# Patient Record
Sex: Female | Born: 1940 | Race: White | Hispanic: No | Marital: Married | Smoking: Never smoker
Health system: Southern US, Community
[De-identification: ages and names within clinical notes are randomized; demographics above are authoritative.]

## PROBLEM LIST (undated history)

## (undated) DIAGNOSIS — M1990 Unspecified osteoarthritis, unspecified site: Secondary | ICD-10-CM

## (undated) DIAGNOSIS — I493 Ventricular premature depolarization: Secondary | ICD-10-CM

## (undated) DIAGNOSIS — I2510 Atherosclerotic heart disease of native coronary artery without angina pectoris: Secondary | ICD-10-CM

## (undated) DIAGNOSIS — I255 Ischemic cardiomyopathy: Secondary | ICD-10-CM

## (undated) HISTORY — DX: Ventricular premature depolarization: I49.3

---

## 1999-05-17 ENCOUNTER — Encounter: Admission: RE | Admit: 1999-05-17 | Discharge: 1999-05-17 | Payer: Self-pay | Admitting: Family Medicine

## 1999-05-17 ENCOUNTER — Encounter: Payer: Self-pay | Admitting: Family Medicine

## 2000-06-14 ENCOUNTER — Encounter: Admission: RE | Admit: 2000-06-14 | Discharge: 2000-06-14 | Payer: Self-pay | Admitting: Family Medicine

## 2000-06-14 ENCOUNTER — Encounter: Payer: Self-pay | Admitting: Family Medicine

## 2001-05-19 ENCOUNTER — Ambulatory Visit (HOSPITAL_COMMUNITY): Admission: RE | Admit: 2001-05-19 | Discharge: 2001-05-19 | Payer: Self-pay | Admitting: *Deleted

## 2001-05-19 ENCOUNTER — Encounter: Payer: Self-pay | Admitting: *Deleted

## 2001-05-20 ENCOUNTER — Ambulatory Visit (HOSPITAL_COMMUNITY): Admission: RE | Admit: 2001-05-20 | Discharge: 2001-05-20 | Payer: Self-pay | Admitting: *Deleted

## 2004-08-04 ENCOUNTER — Other Ambulatory Visit: Admission: RE | Admit: 2004-08-04 | Discharge: 2004-08-04 | Payer: Self-pay | Admitting: *Deleted

## 2004-09-07 ENCOUNTER — Encounter: Admission: RE | Admit: 2004-09-07 | Discharge: 2004-09-07 | Payer: Self-pay | Admitting: Family Medicine

## 2004-09-09 ENCOUNTER — Encounter: Admission: RE | Admit: 2004-09-09 | Discharge: 2004-09-09 | Payer: Self-pay | Admitting: Family Medicine

## 2006-01-31 ENCOUNTER — Encounter: Admission: RE | Admit: 2006-01-31 | Discharge: 2006-01-31 | Payer: Self-pay | Admitting: Neurosurgery

## 2007-10-08 ENCOUNTER — Other Ambulatory Visit: Admission: RE | Admit: 2007-10-08 | Discharge: 2007-10-08 | Payer: Self-pay | Admitting: Family Medicine

## 2007-11-07 ENCOUNTER — Encounter: Admission: RE | Admit: 2007-11-07 | Discharge: 2007-11-07 | Payer: Self-pay | Admitting: Family Medicine

## 2009-06-23 ENCOUNTER — Encounter: Admission: RE | Admit: 2009-06-23 | Discharge: 2009-06-23 | Payer: Self-pay | Admitting: Family Medicine

## 2009-11-08 ENCOUNTER — Encounter: Admission: RE | Admit: 2009-11-08 | Discharge: 2009-11-08 | Payer: Self-pay | Admitting: Family Medicine

## 2010-06-25 ENCOUNTER — Encounter: Payer: Self-pay | Admitting: Family Medicine

## 2010-11-09 ENCOUNTER — Other Ambulatory Visit: Payer: Self-pay | Admitting: Family Medicine

## 2010-11-09 DIAGNOSIS — Z1231 Encounter for screening mammogram for malignant neoplasm of breast: Secondary | ICD-10-CM

## 2010-11-15 ENCOUNTER — Ambulatory Visit
Admission: RE | Admit: 2010-11-15 | Discharge: 2010-11-15 | Disposition: A | Discharge status: Home / Self Care | Payer: Medicare Other | Source: Ambulatory Visit | Attending: Family Medicine | Admitting: Family Medicine

## 2010-11-15 DIAGNOSIS — Z1231 Encounter for screening mammogram for malignant neoplasm of breast: Secondary | ICD-10-CM

## 2011-11-21 ENCOUNTER — Other Ambulatory Visit: Payer: Self-pay | Admitting: Family Medicine

## 2011-11-21 DIAGNOSIS — Z1231 Encounter for screening mammogram for malignant neoplasm of breast: Secondary | ICD-10-CM

## 2011-12-12 ENCOUNTER — Ambulatory Visit: Payer: Medicare Other

## 2012-01-08 ENCOUNTER — Ambulatory Visit
Admission: RE | Admit: 2012-01-08 | Discharge: 2012-01-08 | Disposition: A | Discharge status: Home / Self Care | Payer: Medicare Other | Source: Ambulatory Visit | Attending: Family Medicine | Admitting: Family Medicine

## 2012-01-08 DIAGNOSIS — Z1231 Encounter for screening mammogram for malignant neoplasm of breast: Secondary | ICD-10-CM

## 2013-10-22 ENCOUNTER — Other Ambulatory Visit: Payer: Self-pay

## 2013-10-22 DIAGNOSIS — Z1231 Encounter for screening mammogram for malignant neoplasm of breast: Secondary | ICD-10-CM

## 2013-11-06 ENCOUNTER — Ambulatory Visit: Payer: Medicare Other

## 2014-01-14 ENCOUNTER — Ambulatory Visit
Admission: RE | Admit: 2014-01-14 | Discharge: 2014-01-14 | Disposition: A | Discharge status: Home / Self Care | Payer: Medicare Other | Source: Ambulatory Visit

## 2014-01-14 DIAGNOSIS — Z1231 Encounter for screening mammogram for malignant neoplasm of breast: Secondary | ICD-10-CM

## 2015-02-09 ENCOUNTER — Other Ambulatory Visit: Payer: Self-pay

## 2015-02-09 DIAGNOSIS — Z1231 Encounter for screening mammogram for malignant neoplasm of breast: Secondary | ICD-10-CM

## 2015-03-17 ENCOUNTER — Ambulatory Visit: Payer: Self-pay

## 2015-04-21 ENCOUNTER — Ambulatory Visit: Payer: Self-pay

## 2015-06-03 ENCOUNTER — Ambulatory Visit: Payer: Self-pay

## 2015-08-11 ENCOUNTER — Ambulatory Visit
Admission: RE | Admit: 2015-08-11 | Discharge: 2015-08-11 | Disposition: A | Discharge status: Home / Self Care | Payer: PPO | Source: Ambulatory Visit

## 2015-08-11 DIAGNOSIS — Z1231 Encounter for screening mammogram for malignant neoplasm of breast: Secondary | ICD-10-CM | POA: Diagnosis not present

## 2015-11-09 DIAGNOSIS — H1089 Other conjunctivitis: Secondary | ICD-10-CM | POA: Diagnosis not present

## 2015-12-13 DIAGNOSIS — N951 Menopausal and female climacteric states: Secondary | ICD-10-CM | POA: Diagnosis not present

## 2015-12-13 DIAGNOSIS — M810 Age-related osteoporosis without current pathological fracture: Secondary | ICD-10-CM | POA: Diagnosis not present

## 2015-12-13 DIAGNOSIS — Z1211 Encounter for screening for malignant neoplasm of colon: Secondary | ICD-10-CM | POA: Diagnosis not present

## 2015-12-13 DIAGNOSIS — M150 Primary generalized (osteo)arthritis: Secondary | ICD-10-CM | POA: Diagnosis not present

## 2015-12-13 DIAGNOSIS — E782 Mixed hyperlipidemia: Secondary | ICD-10-CM | POA: Diagnosis not present

## 2015-12-13 DIAGNOSIS — Z1389 Encounter for screening for other disorder: Secondary | ICD-10-CM | POA: Diagnosis not present

## 2015-12-13 DIAGNOSIS — E559 Vitamin D deficiency, unspecified: Secondary | ICD-10-CM | POA: Diagnosis not present

## 2015-12-13 DIAGNOSIS — Z791 Long term (current) use of non-steroidal anti-inflammatories (NSAID): Secondary | ICD-10-CM | POA: Diagnosis not present

## 2015-12-13 DIAGNOSIS — Z0000 Encounter for general adult medical examination without abnormal findings: Secondary | ICD-10-CM | POA: Diagnosis not present

## 2015-12-15 DIAGNOSIS — M545 Low back pain: Secondary | ICD-10-CM | POA: Diagnosis not present

## 2015-12-15 DIAGNOSIS — Z0000 Encounter for general adult medical examination without abnormal findings: Secondary | ICD-10-CM | POA: Diagnosis not present

## 2015-12-15 DIAGNOSIS — N951 Menopausal and female climacteric states: Secondary | ICD-10-CM | POA: Diagnosis not present

## 2015-12-15 DIAGNOSIS — M85851 Other specified disorders of bone density and structure, right thigh: Secondary | ICD-10-CM | POA: Diagnosis not present

## 2015-12-15 DIAGNOSIS — E559 Vitamin D deficiency, unspecified: Secondary | ICD-10-CM | POA: Diagnosis not present

## 2015-12-15 DIAGNOSIS — R7301 Impaired fasting glucose: Secondary | ICD-10-CM | POA: Diagnosis not present

## 2015-12-15 DIAGNOSIS — Z1389 Encounter for screening for other disorder: Secondary | ICD-10-CM | POA: Diagnosis not present

## 2015-12-15 DIAGNOSIS — Z01419 Encounter for gynecological examination (general) (routine) without abnormal findings: Secondary | ICD-10-CM | POA: Diagnosis not present

## 2015-12-15 DIAGNOSIS — Z1211 Encounter for screening for malignant neoplasm of colon: Secondary | ICD-10-CM | POA: Diagnosis not present

## 2015-12-15 DIAGNOSIS — Z791 Long term (current) use of non-steroidal anti-inflammatories (NSAID): Secondary | ICD-10-CM | POA: Diagnosis not present

## 2016-01-31 ENCOUNTER — Encounter (HOSPITAL_COMMUNITY): Payer: Self-pay

## 2016-01-31 ENCOUNTER — Emergency Department (HOSPITAL_COMMUNITY): Payer: PPO

## 2016-01-31 ENCOUNTER — Inpatient Hospital Stay (HOSPITAL_COMMUNITY)
Admission: EM | Admit: 2016-01-31 | Discharge: 2016-02-16 | DRG: 234 | Disposition: A | Discharge status: Home Health | Payer: PPO | Attending: Cardiothoracic Surgery | Admitting: Cardiothoracic Surgery

## 2016-01-31 DIAGNOSIS — J9811 Atelectasis: Secondary | ICD-10-CM

## 2016-01-31 DIAGNOSIS — E8770 Fluid overload, unspecified: Secondary | ICD-10-CM | POA: Diagnosis not present

## 2016-01-31 DIAGNOSIS — I420 Dilated cardiomyopathy: Secondary | ICD-10-CM | POA: Diagnosis not present

## 2016-01-31 DIAGNOSIS — I2510 Atherosclerotic heart disease of native coronary artery without angina pectoris: Secondary | ICD-10-CM

## 2016-01-31 DIAGNOSIS — R5383 Other fatigue: Secondary | ICD-10-CM | POA: Diagnosis not present

## 2016-01-31 DIAGNOSIS — I25110 Atherosclerotic heart disease of native coronary artery with unstable angina pectoris: Secondary | ICD-10-CM | POA: Diagnosis not present

## 2016-01-31 DIAGNOSIS — I429 Cardiomyopathy, unspecified: Secondary | ICD-10-CM | POA: Diagnosis not present

## 2016-01-31 DIAGNOSIS — I5181 Takotsubo syndrome: Secondary | ICD-10-CM | POA: Diagnosis present

## 2016-01-31 DIAGNOSIS — R0602 Shortness of breath: Secondary | ICD-10-CM | POA: Diagnosis not present

## 2016-01-31 DIAGNOSIS — I493 Ventricular premature depolarization: Secondary | ICD-10-CM | POA: Diagnosis not present

## 2016-01-31 DIAGNOSIS — E440 Moderate protein-calorie malnutrition: Secondary | ICD-10-CM | POA: Insufficient documentation

## 2016-01-31 DIAGNOSIS — Z951 Presence of aortocoronary bypass graft: Secondary | ICD-10-CM

## 2016-01-31 DIAGNOSIS — D689 Coagulation defect, unspecified: Secondary | ICD-10-CM | POA: Diagnosis not present

## 2016-01-31 DIAGNOSIS — E785 Hyperlipidemia, unspecified: Secondary | ICD-10-CM | POA: Diagnosis not present

## 2016-01-31 DIAGNOSIS — Z23 Encounter for immunization: Secondary | ICD-10-CM | POA: Diagnosis not present

## 2016-01-31 DIAGNOSIS — D6959 Other secondary thrombocytopenia: Secondary | ICD-10-CM | POA: Diagnosis not present

## 2016-01-31 DIAGNOSIS — Z79890 Hormone replacement therapy (postmenopausal): Secondary | ICD-10-CM

## 2016-01-31 DIAGNOSIS — D72829 Elevated white blood cell count, unspecified: Secondary | ICD-10-CM | POA: Diagnosis not present

## 2016-01-31 DIAGNOSIS — R079 Chest pain, unspecified: Secondary | ICD-10-CM | POA: Diagnosis not present

## 2016-01-31 DIAGNOSIS — D62 Acute posthemorrhagic anemia: Secondary | ICD-10-CM | POA: Diagnosis not present

## 2016-01-31 DIAGNOSIS — I255 Ischemic cardiomyopathy: Secondary | ICD-10-CM | POA: Diagnosis not present

## 2016-01-31 DIAGNOSIS — I214 Non-ST elevation (NSTEMI) myocardial infarction: Secondary | ICD-10-CM | POA: Diagnosis not present

## 2016-01-31 DIAGNOSIS — R000 Tachycardia, unspecified: Secondary | ICD-10-CM | POA: Diagnosis not present

## 2016-01-31 DIAGNOSIS — M1990 Unspecified osteoarthritis, unspecified site: Secondary | ICD-10-CM

## 2016-01-31 DIAGNOSIS — K219 Gastro-esophageal reflux disease without esophagitis: Secondary | ICD-10-CM | POA: Diagnosis not present

## 2016-01-31 DIAGNOSIS — Z791 Long term (current) use of non-steroidal anti-inflammatories (NSAID): Secondary | ICD-10-CM | POA: Diagnosis not present

## 2016-01-31 DIAGNOSIS — I10 Essential (primary) hypertension: Secondary | ICD-10-CM | POA: Diagnosis not present

## 2016-01-31 DIAGNOSIS — R1013 Epigastric pain: Secondary | ICD-10-CM | POA: Diagnosis not present

## 2016-01-31 DIAGNOSIS — I25700 Atherosclerosis of coronary artery bypass graft(s), unspecified, with unstable angina pectoris: Secondary | ICD-10-CM | POA: Diagnosis not present

## 2016-01-31 DIAGNOSIS — I472 Ventricular tachycardia: Secondary | ICD-10-CM | POA: Diagnosis not present

## 2016-01-31 DIAGNOSIS — Z01810 Encounter for preprocedural cardiovascular examination: Secondary | ICD-10-CM | POA: Diagnosis not present

## 2016-01-31 HISTORY — DX: Atherosclerotic heart disease of native coronary artery without angina pectoris: I25.10

## 2016-01-31 HISTORY — DX: Unspecified osteoarthritis, unspecified site: M19.90

## 2016-01-31 HISTORY — DX: Ischemic cardiomyopathy: I25.5

## 2016-01-31 LAB — CBC
HEMATOCRIT: 41.6 % (ref 36.0–46.0)
Hemoglobin: 14.2 g/dL (ref 12.0–15.0)
MCH: 30.2 pg (ref 26.0–34.0)
MCHC: 34.1 g/dL (ref 30.0–36.0)
MCV: 88.5 fL (ref 78.0–100.0)
Platelets: 182 10*3/uL (ref 150–400)
RBC: 4.7 MIL/uL (ref 3.87–5.11)
RDW: 12.5 % (ref 11.5–15.5)
WBC: 9.6 10*3/uL (ref 4.0–10.5)

## 2016-01-31 LAB — BASIC METABOLIC PANEL
ANION GAP: 9 (ref 5–15)
BUN: 13 mg/dL (ref 6–20)
CO2: 24 mmol/L (ref 22–32)
Calcium: 9.8 mg/dL (ref 8.9–10.3)
Chloride: 109 mmol/L (ref 101–111)
Creatinine, Ser: 0.9 mg/dL (ref 0.44–1.00)
GFR calc Af Amer: >60 mL/min (ref >60)
GFR calc non Af Amer: >60 mL/min (ref >60)
GLUCOSE: 119 mg/dL — AB (ref 65–99)
POTASSIUM: 3.6 mmol/L (ref 3.5–5.1)
Sodium: 142 mmol/L (ref 135–145)

## 2016-01-31 LAB — APTT: aPTT: 27 seconds (ref 24–36)

## 2016-01-31 LAB — PROTIME-INR
INR: 0.98
Prothrombin Time: 13 seconds (ref 11.4–15.2)

## 2016-01-31 LAB — I-STAT TROPONIN, ED: TROPONIN I, POC: 0.17 ng/mL — AB (ref 0.00–0.08)

## 2016-01-31 MED ORDER — ATORVASTATIN CALCIUM 40 MG PO TABS
40.0000 mg | ORAL_TABLET | Freq: Every day | ORAL | Status: DC
  Administered 2016-02-02 – 2016-02-09 (×8): 40 mg via ORAL
  Filled 2016-01-31 (×9): qty 1

## 2016-01-31 MED ORDER — PROGESTERONE MICRONIZED 100 MG PO CAPS
100.0000 mg | ORAL_CAPSULE | Freq: Every day | ORAL | Status: DC
  Administered 2016-02-01 – 2016-02-05 (×5): 100 mg via ORAL
  Filled 2016-01-31 (×6): qty 1

## 2016-01-31 MED ORDER — FAMOTIDINE 20 MG PO TABS
10.0000 mg | ORAL_TABLET | Freq: Every day | ORAL | Status: DC
  Administered 2016-02-01 – 2016-02-09 (×9): 10 mg via ORAL
  Filled 2016-01-31 (×9): qty 1

## 2016-01-31 MED ORDER — ONDANSETRON HCL 4 MG/2ML IJ SOLN: 4.0000 mg | Freq: Four times a day (QID) | INTRAMUSCULAR | Status: DC | PRN

## 2016-01-31 MED ORDER — ENOXAPARIN SODIUM 60 MG/0.6ML ~~LOC~~ SOLN
1.0000 mg/kg | Freq: Once | SUBCUTANEOUS | Status: AC
  Administered 2016-01-31: 55 mg via SUBCUTANEOUS
  Filled 2016-01-31: qty 0.6

## 2016-01-31 MED ORDER — METOPROLOL TARTRATE 12.5 MG HALF TABLET
12.5000 mg | ORAL_TABLET | Freq: Two times a day (BID) | ORAL | Status: DC
  Administered 2016-02-01 – 2016-02-09 (×19): 12.5 mg via ORAL
  Filled 2016-01-31 (×19): qty 1

## 2016-01-31 MED ORDER — ASPIRIN 81 MG PO CHEW
324.0000 mg | CHEWABLE_TABLET | Freq: Once | ORAL | Status: AC
  Administered 2016-01-31: 324 mg via ORAL
  Filled 2016-01-31: qty 4

## 2016-01-31 MED ORDER — ASPIRIN EC 81 MG PO TBEC
81.0000 mg | DELAYED_RELEASE_TABLET | Freq: Every day | ORAL | Status: DC
  Administered 2016-02-02 – 2016-02-09 (×8): 81 mg via ORAL
  Filled 2016-01-31 (×8): qty 1

## 2016-01-31 MED ORDER — VITAMIN D 1000 UNITS PO TABS
2000.0000 [IU] | ORAL_TABLET | Freq: Two times a day (BID) | ORAL | Status: DC
  Administered 2016-02-01 – 2016-02-09 (×18): 2000 [IU] via ORAL
  Filled 2016-01-31 (×18): qty 2

## 2016-01-31 MED ORDER — ESTRADIOL 1 MG PO TABS
0.5000 mg | ORAL_TABLET | Freq: Every day | ORAL | Status: DC
  Administered 2016-02-01 – 2016-02-05 (×5): 0.5 mg via ORAL
  Filled 2016-01-31 (×6): qty 0.5

## 2016-01-31 MED ORDER — NITROGLYCERIN 0.4 MG SL SUBL: 0.4000 mg | SUBLINGUAL_TABLET | SUBLINGUAL | Status: DC | PRN

## 2016-01-31 MED ORDER — ACETAMINOPHEN 325 MG PO TABS: 650.0000 mg | ORAL_TABLET | ORAL | Status: DC | PRN

## 2016-01-31 NOTE — ED Triage Notes (Signed)
Pt presents to ED after PCP blood work resulted Troponin 0.36 and minor EKG changes.   C/o generalized chest tightness and SOB w/ exertion 5 days.  Denies pain.  Pt reports "terrible acid reflux" and emesis x 1 episode x 6 days ago.

## 2016-01-31 NOTE — ED Notes (Signed)
Attempted to call report to [facility]. Nurse from floor will call back for report.

## 2016-01-31 NOTE — ED Provider Notes (Signed)
[facility]-EMERGENCY DEPT Provider Note   CSN: [id number] Arrival date & time: 01/31/16  1859     History   Chief Complaint Chief Complaint  Patient presents with  . Elevated Troponin    0.36  . Chest Pain    HPI Carolyn Burns is a 75 y.o. female.  HPI 75 year old female who presents with elevated troponin. History of OA. One week ago, in kitchen and develop severe cramping anterior chest pain with nausea and vomiting. Lasted 30 minutes, before resolving. Since then with exertional shortness of breath and chest tightness with walking, cleaning her home. No leg swelling or leg pain. No fever, cough, orthopnea, PND. Very fatigued since initial incident one week ago. Currently w/o chest pain. Seen by PCP with elevated troponin of 0.36 today. Sent to ED  Past Medical History:  Diagnosis Date  . Osteoarthritis     Patient Active Problem List   Diagnosis Date Noted  . NSTEMI (non-ST elevated myocardial infarction) (HCC) 01/31/2016    History reviewed. No pertinent surgical history.  OB History    No data available       Home Medications    Prior to Admission medications   Not on File    Family History History reviewed. No pertinent family history.  Social History Social History  Substance Use Topics  . Smoking status: Never Smoker  . Smokeless tobacco: Never Used  . Alcohol use No     Allergies   Aleve [naproxen sodium] and Codeine   Review of Systems Review of Systems 10/14 systems reviewed and are negative other than those stated in the HPI   Physical Exam Updated Vital Signs BP 158/85 (BP Location: Left Arm)   Pulse 89   Temp 98 F (36.7 C) (Oral)   Resp 17   Ht 5\' 1"  (1.549 m)   Wt 116 lb (52.6 kg)   SpO2 97%   BMI 21.92 kg/m   Physical Exam Physical Exam  Nursing note and vitals reviewed. Constitutional: Well developed, well nourished, non-toxic, and in no acute distress Head: Normocephalic and atraumatic.  Mouth/Throat: Oropharynx  is clear and moist.  Neck: Normal range of motion. Neck supple.  Cardiovascular: Normal rate and regular rhythm.   Pulmonary/Chest: Effort normal and breath sounds normal.  Abdominal: Soft. There is no tenderness. There is no rebound and no guarding.  Musculoskeletal: Normal range of motion.  Neurological: Alert, no facial droop, fluent speech, moves all extremities symmetrically Skin: Skin is warm and dry.  Psychiatric: Cooperative   ED Treatments / Results  Labs (all labs ordered are listed, but only abnormal results are displayed) Labs Reviewed  BASIC METABOLIC PANEL - Abnormal; Notable for the following:       Result Value   Glucose, Bld 119 (*)    All other components within normal limits  I-STAT TROPOININ, ED - Abnormal; Notable for the following:    Troponin i, poc 0.17 (*)    All other components within normal limits  CBC  APTT  PROTIME-INR    EKG  EKG Interpretation  Date/Time:  Monday January 31 2016 19:10:16 EDT Ventricular Rate:  90 PR Interval:    QRS Duration: 82 QT Interval:  367 QTC Calculation: 449 R Axis:   14 Text Interpretation:  Sinus rhythm Borderline T abnormalities, inferior leads  No prior EKG  Confirmed by Patterson Hollenbaugh MD, Trayshawn Durkin [phone]([id number]) on 01/31/2016 7:38:27 PM       Radiology Dg Chest 2 View  Result Date: 01/31/2016 CLINICAL DATA:  Chest tightness and shortness breath with exertion for the past 5 days. Ex-smoker. EXAM: CHEST  2 VIEW COMPARISON:  01/31/2016. FINDINGS: Normal sized heart. Clear lungs with normal vascularity. Minimal central peribronchial thickening. Mild thoracic spine degenerative changes. IMPRESSION: Interval minimal bronchitic changes. Electronically Signed   By: Beckie Salts M.D.   On: 01/31/2016 19:33    Procedures Procedures (including critical care time)  Medications Ordered in ED Medications  aspirin chewable tablet 324 mg (not administered)  enoxaparin (LOVENOX) injection 55 mg (not administered)     Initial Impression /  Assessment and Plan / ED Course  I have reviewed the triage vital signs and the nursing notes.  Pertinent labs & imaging results that were available during my care of the patient were reviewed by me and considered in my medical decision making (see chart for details).  Clinical Course    With NSTEMI. Discussed with cardiology to admit at [facility]. EKG with TWI in inferolateral leads. Troponin at 0.17. Given aspirin. Per cardiology, can give Lovenox instead of heparin drip  HD stable in ED. Chest pain free in ED.   Final Clinical Impressions(s) / ED Diagnoses   Final diagnoses:  NSTEMI (non-ST elevated myocardial infarction) [facility])    New Prescriptions New Prescriptions   No medications on file     Lavera Guise, MD 01/31/16 2047

## 2016-01-31 NOTE — Progress Notes (Signed)
Patient reports her pcp is Dr. Selena BattenWendy McNeil.  System updated.

## 2016-01-31 NOTE — ED Notes (Signed)
[facility] called for transport. 

## 2016-01-31 NOTE — Progress Notes (Addendum)
[facility] and [facility] to the room. Tele monitoring and skin assessment completed. Physician paged, family members at beside. Call light within reach patient informed of NPO status at midnight

## 2016-02-01 ENCOUNTER — Encounter (HOSPITAL_COMMUNITY)
Admission: EM | Disposition: A | Discharge status: Home Health | Payer: Self-pay | Source: Home / Self Care | Attending: Cardiovascular Disease

## 2016-02-01 DIAGNOSIS — I214 Non-ST elevation (NSTEMI) myocardial infarction: Principal | ICD-10-CM

## 2016-02-01 DIAGNOSIS — I2510 Atherosclerotic heart disease of native coronary artery without angina pectoris: Secondary | ICD-10-CM | POA: Diagnosis not present

## 2016-02-01 HISTORY — PX: CARDIAC CATHETERIZATION: SHX172

## 2016-02-01 LAB — CBC
HCT: 41.3 % (ref 36.0–46.0)
Hemoglobin: 14 g/dL (ref 12.0–15.0)
MCH: 30.2 pg (ref 26.0–34.0)
MCHC: 33.9 g/dL (ref 30.0–36.0)
MCV: 89 fL (ref 78.0–100.0)
PLATELETS: 174 10*3/uL (ref 150–400)
RBC: 4.64 MIL/uL (ref 3.87–5.11)
RDW: 12.6 % (ref 11.5–15.5)
WBC: 9 10*3/uL (ref 4.0–10.5)

## 2016-02-01 LAB — BASIC METABOLIC PANEL
Anion gap: 8 (ref 5–15)
BUN: 9 mg/dL (ref 6–20)
CALCIUM: 9.3 mg/dL (ref 8.9–10.3)
CO2: 25 mmol/L (ref 22–32)
CREATININE: 0.85 mg/dL (ref 0.44–1.00)
Chloride: 108 mmol/L (ref 101–111)
GFR calc Af Amer: >60 mL/min (ref >60)
Glucose, Bld: 114 mg/dL — ABNORMAL HIGH (ref 65–99)
Potassium: 3.8 mmol/L (ref 3.5–5.1)
SODIUM: 141 mmol/L (ref 135–145)

## 2016-02-01 LAB — TROPONIN I
TROPONIN I: 0.34 ng/mL — AB (ref <0.03)
TROPONIN I: 0.38 ng/mL — AB (ref <0.03)
TROPONIN I: 0.44 ng/mL — AB (ref <0.03)

## 2016-02-01 LAB — LIPID PANEL
CHOLESTEROL: 200 mg/dL (ref 0–200)
HDL: 34 mg/dL — ABNORMAL LOW (ref >40)
LDL Cholesterol: 140 mg/dL — ABNORMAL HIGH (ref 0–99)
Total CHOL/HDL Ratio: 5.9 RATIO
Triglycerides: 131 mg/dL (ref <150)
VLDL: 26 mg/dL (ref 0–40)

## 2016-02-01 LAB — PROTIME-INR
INR: 1.06
Prothrombin Time: 13.8 seconds (ref 11.4–15.2)

## 2016-02-01 LAB — MRSA PCR SCREENING: MRSA BY PCR: NEGATIVE

## 2016-02-01 SURGERY — LEFT HEART CATH AND CORONARY ANGIOGRAPHY

## 2016-02-01 MED ORDER — MIDAZOLAM HCL 2 MG/2ML IJ SOLN
INTRAMUSCULAR | Status: DC | PRN
Start: 2016-02-01 — End: 2016-02-01
  Administered 2016-02-01: 1 mg via INTRAVENOUS

## 2016-02-01 MED ORDER — ONDANSETRON HCL 4 MG/2ML IJ SOLN
4.0000 mg | Freq: Four times a day (QID) | INTRAMUSCULAR | Status: DC | PRN
  Administered 2016-02-06: 4 mg via INTRAVENOUS
  Filled 2016-02-01: qty 2

## 2016-02-01 MED ORDER — DIAZEPAM 5 MG PO TABS: 5.0000 mg | ORAL_TABLET | Freq: Four times a day (QID) | ORAL | Status: DC | PRN

## 2016-02-01 MED ORDER — SODIUM CHLORIDE 0.9% FLUSH
3.0000 mL | Freq: Two times a day (BID) | INTRAVENOUS | Status: DC
  Administered 2016-02-02 – 2016-02-05 (×6): 3 mL via INTRAVENOUS

## 2016-02-01 MED ORDER — SODIUM CHLORIDE 0.9 % WEIGHT BASED INFUSION: 1.0000 mL/kg/h | INTRAVENOUS | Status: DC

## 2016-02-01 MED ORDER — SODIUM CHLORIDE 0.9% FLUSH: 3.0000 mL | Freq: Two times a day (BID) | INTRAVENOUS | Status: DC

## 2016-02-01 MED ORDER — HEPARIN (PORCINE) IN NACL 100-0.45 UNIT/ML-% IJ SOLN
1000.0000 [IU]/h | INTRAMUSCULAR | Status: DC
  Administered 2016-02-02: 750 [IU]/h via INTRAVENOUS
  Filled 2016-02-01 (×2): qty 250

## 2016-02-01 MED ORDER — FENTANYL CITRATE (PF) 100 MCG/2ML IJ SOLN
INTRAMUSCULAR | Status: AC
  Filled 2016-02-01: qty 2

## 2016-02-01 MED ORDER — HEPARIN (PORCINE) IN NACL 2-0.9 UNIT/ML-% IJ SOLN
INTRAMUSCULAR | Status: AC
  Filled 2016-02-01: qty 1000

## 2016-02-01 MED ORDER — SODIUM CHLORIDE 0.9% FLUSH: 3.0000 mL | INTRAVENOUS | Status: DC | PRN

## 2016-02-01 MED ORDER — SODIUM CHLORIDE 0.9 % WEIGHT BASED INFUSION
1.0000 mL/kg/h | INTRAVENOUS | Status: DC
  Administered 2016-02-01: 1 mL/kg/h via INTRAVENOUS

## 2016-02-01 MED ORDER — NITROGLYCERIN IN D5W 200-5 MCG/ML-% IV SOLN
INTRAVENOUS | Status: AC
  Filled 2016-02-01: qty 250

## 2016-02-01 MED ORDER — VERAPAMIL HCL 2.5 MG/ML IV SOLN
INTRAVENOUS | Status: AC
  Filled 2016-02-01: qty 2

## 2016-02-01 MED ORDER — ALPRAZOLAM 0.5 MG PO TABS
1.0000 mg | ORAL_TABLET | Freq: Two times a day (BID) | ORAL | Status: DC | PRN
  Administered 2016-02-09: 1 mg via ORAL
  Filled 2016-02-01: qty 2

## 2016-02-01 MED ORDER — MIDAZOLAM HCL 2 MG/2ML IJ SOLN
INTRAMUSCULAR | Status: AC
  Filled 2016-02-01: qty 2

## 2016-02-01 MED ORDER — HEPARIN (PORCINE) IN NACL 2-0.9 UNIT/ML-% IJ SOLN
INTRAMUSCULAR | Status: DC | PRN
  Administered 2016-02-01: 1000 mL

## 2016-02-01 MED ORDER — IOPAMIDOL (ISOVUE-370) INJECTION 76%
INTRAVENOUS | Status: AC
  Filled 2016-02-01: qty 100

## 2016-02-01 MED ORDER — FENTANYL CITRATE (PF) 100 MCG/2ML IJ SOLN
INTRAMUSCULAR | Status: DC | PRN
  Administered 2016-02-01: 25 ug via INTRAVENOUS

## 2016-02-01 MED ORDER — IOPAMIDOL (ISOVUE-370) INJECTION 76%
INTRAVENOUS | Status: AC
Start: 2016-02-01 — End: 2016-02-01
  Filled 2016-02-01: qty 100

## 2016-02-01 MED ORDER — SODIUM CHLORIDE 0.9 % IV SOLN
INTRAVENOUS | Status: DC
  Administered 2016-02-02: 03:00:00 via INTRAVENOUS

## 2016-02-01 MED ORDER — ACETAMINOPHEN 325 MG PO TABS: 650.0000 mg | ORAL_TABLET | ORAL | Status: DC | PRN

## 2016-02-01 MED ORDER — IOPAMIDOL (ISOVUE-370) INJECTION 76%
INTRAVENOUS | Status: DC | PRN
  Administered 2016-02-01: 140 mL via INTRA_ARTERIAL

## 2016-02-01 MED ORDER — SODIUM CHLORIDE 0.9 % WEIGHT BASED INFUSION
3.0000 mL/kg/h | INTRAVENOUS | Status: DC
  Administered 2016-02-01: 3 mL/kg/h via INTRAVENOUS

## 2016-02-01 MED ORDER — NITROGLYCERIN 1 MG/10 ML FOR IR/CATH LAB
INTRA_ARTERIAL | Status: AC
Start: 2016-02-01 — End: 2016-02-01
  Filled 2016-02-01: qty 10

## 2016-02-01 MED ORDER — LIDOCAINE HCL (PF) 1 % IJ SOLN
INTRAMUSCULAR | Status: DC | PRN
  Administered 2016-02-01: 2 mL via INTRADERMAL

## 2016-02-01 MED ORDER — NITROGLYCERIN IN D5W 200-5 MCG/ML-% IV SOLN
INTRAVENOUS | Status: DC | PRN
  Administered 2016-02-01: 10 ug/min via INTRAVENOUS

## 2016-02-01 MED ORDER — ASPIRIN 81 MG PO CHEW: 81.0000 mg | CHEWABLE_TABLET | Freq: Every day | ORAL | Status: DC

## 2016-02-01 MED ORDER — SODIUM CHLORIDE 0.9 % IV SOLN: 250.0000 mL | INTRAVENOUS | Status: DC | PRN

## 2016-02-01 MED ORDER — HEPARIN SODIUM (PORCINE) 1000 UNIT/ML IJ SOLN
INTRAMUSCULAR | Status: DC | PRN
Start: 2016-02-01 — End: 2016-02-01
  Administered 2016-02-01: 2700 [IU] via INTRAVENOUS

## 2016-02-01 MED ORDER — NITROGLYCERIN 1 MG/10 ML FOR IR/CATH LAB
INTRA_ARTERIAL | Status: DC | PRN
  Administered 2016-02-01: 100 ug via INTRA_ARTERIAL
  Administered 2016-02-01: 200 ug via INTRA_ARTERIAL

## 2016-02-01 MED ORDER — VERAPAMIL HCL 2.5 MG/ML IV SOLN
INTRAVENOUS | Status: DC | PRN
  Administered 2016-02-01: 10 mL via INTRA_ARTERIAL

## 2016-02-01 MED ORDER — ZOLPIDEM TARTRATE 5 MG PO TABS: 5.0000 mg | ORAL_TABLET | Freq: Every evening | ORAL | Status: DC | PRN

## 2016-02-01 MED ORDER — ASPIRIN 81 MG PO CHEW
81.0000 mg | CHEWABLE_TABLET | ORAL | Status: AC
  Administered 2016-02-01: 81 mg via ORAL
  Filled 2016-02-01: qty 1

## 2016-02-01 MED ORDER — ENOXAPARIN SODIUM 60 MG/0.6ML ~~LOC~~ SOLN: 55.0000 mg | Freq: Two times a day (BID) | SUBCUTANEOUS | Status: DC

## 2016-02-01 MED ORDER — SODIUM CHLORIDE 0.9 % WEIGHT BASED INFUSION: 3.0000 mL/kg/h | INTRAVENOUS | Status: DC

## 2016-02-01 MED ORDER — LIDOCAINE HCL (PF) 1 % IJ SOLN
INTRAMUSCULAR | Status: AC
  Filled 2016-02-01: qty 30

## 2016-02-01 SURGICAL SUPPLY — 13 items
CATH IMPULSE 5F ANG/FL3.5 (CATHETERS) ×2 IMPLANT
CATH LAUNCHER 5F RADL (CATHETERS) IMPLANT
CATH SITESEER 5F NTR (CATHETERS) ×2 IMPLANT
CATHETER LAUNCHER 5F RADL (CATHETERS) ×3
DEVICE RAD COMP TR BAND LRG (VASCULAR PRODUCTS) ×2 IMPLANT
GLIDESHEATH SLEND SS 6F .021 (SHEATH) ×2 IMPLANT
KIT HEART LEFT (KITS) ×3 IMPLANT
PACK CARDIAC CATHETERIZATION (CUSTOM PROCEDURE TRAY) ×3 IMPLANT
SYR MEDRAD MARK V 150ML (SYRINGE) ×3 IMPLANT
TRANSDUCER W/STOPCOCK (MISCELLANEOUS) ×3 IMPLANT
TUBING CIL FLEX 10 FLL-RA (TUBING) ×3 IMPLANT
WIRE HI TORQ VERSACORE-J 145CM (WIRE) ×2 IMPLANT
WIRE SAFE-T 1.5MM-J .035X260CM (WIRE) ×2 IMPLANT

## 2016-02-01 NOTE — Progress Notes (Signed)
Patient Name: Carolyn Burns Date of Encounter: 02/01/2016  Hospital Problem List     Active Problems:   NSTEMI (non-ST elevated myocardial infarction) [facility])    Subjective   Feeling well this morning. No reports of angina or dyspnea.   Inpatient Medications    . aspirin EC  81 mg Oral Daily  . atorvastatin  40 mg Oral q1800  . cholecalciferol  2,000 Units Oral BID  . estradiol  0.5 mg Oral Daily  . famotidine  10 mg Oral Daily  . metoprolol tartrate  12.5 mg Oral BID  . progesterone  100 mg Oral Daily  . sodium chloride flush  3 mL Intravenous Q12H    Vital Signs    Vitals:   01/31/16 2000 01/31/16 2100 01/31/16 2130 02/01/16 0430  BP: 152/79 148/91 152/90 (!) 102/55  Pulse: 85 82 77 61  Resp: 20 15 21 20   Temp:    98.8 F (37.1 C)  TempSrc:    Oral  SpO2: 97% 99% 96% 98%  Weight:      Height:       No intake or output data in the 24 hours ending 02/01/16 0950 Filed Weights   01/31/16 1913  Weight: 116 lb (52.6 kg)    Physical Exam    General: Pleasant older female, NAD. Neuro: Alert and oriented X 3. Moves all extremities spontaneously. Psych: Normal affect. HEENT:  Normal  Neck: Supple without bruits or JVD. Lungs:  Resp regular and unlabored, CTA. Heart: RRR no s3, s4, or murmurs. Abdomen: Soft, non-tender, non-distended, BS + x 4.  Extremities: No clubbing, cyanosis or edema. DP/PT/Radials 2+ and equal bilaterally.  Labs    CBC  Recent Labs  01/31/16 1935 02/01/16 0508  WBC 9.6 9.0  HGB 14.2 14.0  HCT 41.6 41.3  MCV 88.5 89.0  PLT 182 174   Basic Metabolic Panel  Recent Labs  01/31/16 1935 02/01/16 0508  NA 142 141  K 3.6 3.8  CL 109 108  CO2 24 25  GLUCOSE 119* 114*  BUN 13 9  CREATININE 0.90 0.85  CALCIUM 9.8 9.3   Liver Function Tests No results for input(s): AST, ALT, ALKPHOS, BILITOT, PROT, ALBUMIN in the last 72 hours. No results for input(s): LIPASE, AMYLASE in the last 72 hours. Cardiac Enzymes  Recent  Labs  01/31/16 2352 02/01/16 0508  TROPONINI 0.44* 0.38*   BNP Invalid input(s): POCBNP D-Dimer No results for input(s): DDIMER in the last 72 hours. Hemoglobin A1C No results for input(s): HGBA1C in the last 72 hours. Fasting Lipid Panel  Recent Labs  02/01/16 0508  CHOL 200  HDL 34*  LDLCALC 140*  TRIG 131  CHOLHDL 5.9   Thyroid Function Tests No results for input(s): TSH, T4TOTAL, T3FREE, THYROIDAB in the last 72 hours.  Invalid input(s): FREET3  Telemetry    SR   ECG    SR with TWI in Lead II, III, aVF, v4, v5  Radiology    Dg Chest 2 View  Result Date: 01/31/2016 CLINICAL DATA:  Chest tightness and shortness breath with exertion for the past 5 days. Ex-smoker. EXAM: CHEST  2 VIEW COMPARISON:  01/31/2016. FINDINGS: Normal sized heart. Clear lungs with normal vascularity. Minimal central peribronchial thickening. Mild thoracic spine degenerative changes. IMPRESSION: Interval minimal bronchitic changes. Electronically Signed   By: Beckie Salts M.D.   On: 01/31/2016 19:33    Assessment & Plan    75yo female with PMH significant only for OA and GERD  who presented [facility] ED with SSCP.  1. NSTEMI/Unstable Angina: Progressive angina with activity over the past couple of weeks, along with DOE. Presented to her PCP and told to come to the ED for further evaluation. Trop peak at 0.44, morning Cr stable. EKG showing TWI in Lead II, III, aVF, v4, v5, no previous to compare. Planned for LHC this afternoon, with recommendations to follow. The patient understands that risks included but are not limited to stroke (1 in 1000), death (1 in 1000), kidney failure [usually temporary] (1 in 500), bleeding (1 in 200), allergic reaction [possibly serious] (1 in 200).  -- Lipid panel shows HDL 34, and LDL 140, started on statin on admission. Will need LFTs and FLP in 4-6 weeks.   2. GERD  3. OA: Celecoxib held in the setting of NSTEMI    Signed, Laverda PageLindsay Roberts  NP-C Pager [phone]   History and all data above reviewed.  Patient examined.  I agree with the findings as above.  Admitted early this morning.   The patient exam reveals COR:RRR  ,  Lungs: Clear  ,  Abd: Positive bowel sounds, no rebound no guarding, Ext No edema  .  All available labs, radiology testing, previous records reviewed. Agree with documented assessment and plan.  NSTEMI:  Cath as described.   Rollene RotundaJames Cathrine Krizan  10:51 AM  02/01/2016

## 2016-02-01 NOTE — Progress Notes (Signed)
ANTICOAGULATION CONSULT NOTE - Initial Consult  Pharmacy Consult for Lovenox Indication: chest pain/ACS  Allergies  Allergen Reactions  . Aleve [Naproxen Sodium] Itching and Other (See Comments)    Reaction:  Redness on palms of hands/bottom of feet   . Codeine Nausea Only    Patient Measurements: Height: 5\' 1"  (154.9 cm) Weight: 116 lb (52.6 kg) IBW/kg (Calculated) : 47.8  Vital Signs: Temp: 98 F (36.7 C) (08/28 1913) Temp Source: Oral (08/28 1913) BP: 152/90 (08/28 2130) Pulse Rate: 77 (08/28 2130)  Labs:  Recent Labs  01/31/16 1935 01/31/16 2352  HGB 14.2  --   HCT 41.6  --   PLT 182  --   APTT 27  --   LABPROT 13.0  --   INR 0.98  --   CREATININE 0.90  --   TROPONINI  --  0.44*    Estimated Creatinine Clearance: 40.8 mL/min (by C-G formula based on SCr of 0.9 mg/dL).   Medical History: Past Medical History:  Diagnosis Date  . Osteoarthritis     Medications:  Prescriptions Prior to Admission  Medication Sig Dispense Refill Last Dose  . celecoxib (CELEBREX) 200 MG capsule Take 200 mg by mouth daily.   01/31/2016 at Unknown time  . cholecalciferol (VITAMIN D) 1000 units tablet Take 2,000 Units by mouth 2 (two) times daily.   01/31/2016 at Unknown time  . estradiol (ESTRACE) 0.5 MG tablet Take 0.5 mg by mouth daily.   01/31/2016 at Unknown time  . Krill Oil 1000 MG CAPS Take 1,000 mg by mouth daily.   01/31/2016 at Unknown time  . progesterone (PROMETRIUM) 100 MG capsule Take 100 mg by mouth daily.   01/31/2016 at Unknown time  . ranitidine (ZANTAC) 150 MG tablet Take 150 mg by mouth daily.   01/31/2016 at Unknown time    Assessment: 75 y.o. F presents with NSTEMI. [facility] 55[facility]. To continue Lovenox for NSTEMI. CBC wnl on admission.  Goal of Therapy:  Anti-Xa level 0.6-1 units/ml 4hrs after LMWH dose given Monitor platelets by anticoagulation protocol: Yes   Plan:  Lovenox 55mg  SQ q12h CBC  q72h while on Lovenox  Christoper Fabianaron Mckensey Berghuis, PharmD, BCPS Clinical pharmacist, pager [phone] 02/01/2016 1:36 AM

## 2016-02-01 NOTE — H&P (View-Only) (Signed)
Patient Name: Carolyn Burns Date of Encounter: 02/01/2016  Hospital Problem List     Active Problems:   NSTEMI (non-ST elevated myocardial infarction) [facility])    Subjective   Feeling well this morning. No reports of angina or dyspnea.   Inpatient Medications    . aspirin EC  81 mg Oral Daily  . atorvastatin  40 mg Oral q1800  . cholecalciferol  2,000 Units Oral BID  . estradiol  0.5 mg Oral Daily  . famotidine  10 mg Oral Daily  . metoprolol tartrate  12.5 mg Oral BID  . progesterone  100 mg Oral Daily  . sodium chloride flush  3 mL Intravenous Q12H    Vital Signs    Vitals:   01/31/16 2000 01/31/16 2100 01/31/16 2130 02/01/16 0430  BP: 152/79 148/91 152/90 (!) 102/55  Pulse: 85 82 77 61  Resp: 20 15 21 20   Temp:    98.8 F (37.1 C)  TempSrc:    Oral  SpO2: 97% 99% 96% 98%  Weight:      Height:       No intake or output data in the 24 hours ending 02/01/16 0950 Filed Weights   01/31/16 1913  Weight: 116 lb (52.6 kg)    Physical Exam    General: Pleasant older female, NAD. Neuro: Alert and oriented X 3. Moves all extremities spontaneously. Psych: Normal affect. HEENT:  Normal  Neck: Supple without bruits or JVD. Lungs:  Resp regular and unlabored, CTA. Heart: RRR no s3, s4, or murmurs. Abdomen: Soft, non-tender, non-distended, BS + x 4.  Extremities: No clubbing, cyanosis or edema. DP/PT/Radials 2+ and equal bilaterally.  Labs    CBC  Recent Labs  01/31/16 1935 02/01/16 0508  WBC 9.6 9.0  HGB 14.2 14.0  HCT 41.6 41.3  MCV 88.5 89.0  PLT 182 174   Basic Metabolic Panel  Recent Labs  01/31/16 1935 02/01/16 0508  NA 142 141  K 3.6 3.8  CL 109 108  CO2 24 25  GLUCOSE 119* 114*  BUN 13 9  CREATININE 0.90 0.85  CALCIUM 9.8 9.3   Liver Function Tests No results for input(s): AST, ALT, ALKPHOS, BILITOT, PROT, ALBUMIN in the last 72 hours. No results for input(s): LIPASE, AMYLASE in the last 72 hours. Cardiac Enzymes  Recent  Labs  01/31/16 2352 02/01/16 0508  TROPONINI 0.44* 0.38*   BNP Invalid input(s): POCBNP D-Dimer No results for input(s): DDIMER in the last 72 hours. Hemoglobin A1C No results for input(s): HGBA1C in the last 72 hours. Fasting Lipid Panel  Recent Labs  02/01/16 0508  CHOL 200  HDL 34*  LDLCALC 140*  TRIG 131  CHOLHDL 5.9   Thyroid Function Tests No results for input(s): TSH, T4TOTAL, T3FREE, THYROIDAB in the last 72 hours.  Invalid input(s): FREET3  Telemetry    SR   ECG    SR with TWI in Lead II, III, aVF, v4, v5  Radiology    Dg Chest 2 View  Result Date: 01/31/2016 CLINICAL DATA:  Chest tightness and shortness breath with exertion for the past 5 days. Ex-smoker. EXAM: CHEST  2 VIEW COMPARISON:  01/31/2016. FINDINGS: Normal sized heart. Clear lungs with normal vascularity. Minimal central peribronchial thickening. Mild thoracic spine degenerative changes. IMPRESSION: Interval minimal bronchitic changes. Electronically Signed   By: Beckie Salts M.D.   On: 01/31/2016 19:33    Assessment & Plan    75yo female with PMH significant only for OA and GERD  who presented [facility] with SSCP.  1. NSTEMI/Unstable Angina: Progressive angina with activity over the past couple of weeks, along with DOE. Presented to her PCP and told to come to the ED for further evaluation. Trop peak at 0.44, morning Cr stable. EKG showing TWI in Lead II, III, aVF, v4, v5, no previous to compare. Planned for LHC this afternoon, with recommendations to follow. The patient understands that risks included but are not limited to stroke (1 in 1000), death (1 in 1000), kidney failure [usually temporary] (1 in 500), bleeding (1 in 200), allergic reaction [possibly serious] (1 in 200).  -- Lipid panel shows HDL 34, and LDL 140, started on statin on admission. Will need LFTs and FLP in 4-6 weeks.   2. GERD  3. OA: Celecoxib held in the setting of NSTEMI    Signed, Laverda PageLindsay Roberts  NP-C Pager [phone]   History and all data above reviewed.  Patient examined.  I agree with the findings as above.  Admitted early this morning.   The patient exam reveals COR:RRR  ,  Lungs: Clear  ,  Abd: Positive bowel sounds, no rebound no guarding, Ext No edema  .  All available labs, radiology testing, previous records reviewed. Agree with documented assessment and plan.  NSTEMI:  Cath as described.   Rollene RotundaJames Ozie Dimaria  10:51 AM  02/01/2016

## 2016-02-01 NOTE — Progress Notes (Signed)
Shawn called from lab with critical troponin of 0.44. Paged Corotto MD to advise, patient was asymptomatic at the time. Unsure of time of page.

## 2016-02-01 NOTE — H&P (Signed)
Patient ID: Carolyn Burns MRN: [id number], DOB/AGE: [birth year]   Admit date: 01/31/2016  Requesting Physician: Primary Physician: Gweneth Dimitri, MD Primary Cardiologist: None Reason for admission: NSTEMI  Pt. Profile:  Pt is a 75yo female with PMH significant only for OA and GERD who presented to the [facility] ED with SSCP. Carolyn Burns had been in her normal state of health until one week ago when she experienced acute onset "sharp" SSCP while ambulating in her kitchen. This episode lasted for approximately 30 minutes and was associated with nausea and mild SOB prior to self-resolving. Since that time she has consistently experienced milder episodes of SSCP and fatigue while ambulating which then are relieved with rest, prompting her to present to her PCP for evaluation today. An ambulatory troponin was drawn and found to be elevated at 0.36 and the Carolyn Burns was subsequently asked to present to the ED for evaluation. After arrival to [facility] she was chest pain free and hemodynamically stable. Repeat troponin was 0.17. Given a concern for NSTEMI the pt was given a full dose ASA and started on treatment dose Enoxaparin. She was then transferred to [facility] for further evaluation and management. On arrival here she had remained CP free and had no other acute complaints.    Problem List  Past Medical History:  Diagnosis Date  . Osteoarthritis     History reviewed. No pertinent surgical history.   Allergies  Allergies  Allergen Reactions  . Aleve [Naproxen Sodium] Itching and Other (See Comments)    Reaction:  Redness on palms of hands/bottom of feet   . Codeine Nausea Only     Home Medications  Prior to Admission medications   Medication Sig Start Date End Date Taking? Authorizing Provider  celecoxib (CELEBREX) 200 MG capsule Take 200 mg by mouth daily.   Yes Historical Provider, MD  cholecalciferol (VITAMIN D) 1000 units tablet Take 2,000 Units by  mouth 2 (two) times daily.   Yes Historical Provider, MD  estradiol (ESTRACE) 0.5 MG tablet Take 0.5 mg by mouth daily.   Yes Historical Provider, MD  Boris Lown Oil 1000 MG CAPS Take 1,000 mg by mouth daily.   Yes Historical Provider, MD  progesterone (PROMETRIUM) 100 MG capsule Take 100 mg by mouth daily.   Yes Historical Provider, MD  ranitidine (ZANTAC) 150 MG tablet Take 150 mg by mouth daily.   Yes Historical Provider, MD    Family History  History reviewed. No pertinent family history.  No family status information on file.     Social History  Social History   Social History  . Marital status: Married    Spouse name: N/A  . Number of children: N/A  . Years of education: N/A   Occupational History  . Not on file.   Social History Main Topics  . Smoking status: Never Smoker  . Smokeless tobacco: Never Used  . Alcohol use No  . Drug use: No  . Sexual activity: Not on file   Other Topics Concern  . Not on file   Social History Narrative  . No narrative on file     Review of Systems General:  No chills, fever, night sweats or weight changes.  Cardiovascular:  No chest pain, dyspnea on exertion, edema, orthopnea, palpitations, paroxysmal nocturnal dyspnea. Dermatological: No rash, lesions/masses Respiratory: No cough, dyspnea Urologic: No hematuria, dysuria Abdominal:   No nausea, vomiting, diarrhea, bright red blood per rectum, melena, or hematemesis Neurologic:  No visual changes,  wkns, changes in mental status. All other systems reviewed and are otherwise negative except as noted above.  Physical Exam  Blood pressure 152/90, pulse 77, temperature 98 F (36.7 C), temperature source Oral, resp. rate 21, height 5\' 1"  (1.549 m), weight 52.6 kg (116 lb), SpO2 96 %.  General: Pleasant, NAD Psych: Normal affect. Neuro: Alert and oriented X 3. Moves all extremities spontaneously. HEENT: Normal  Neck: Supple without bruits or JVD. Lungs: CTAB, no w/r/c Heart: RRR,  +S1 +S2, no appreciable m/r/g, no JVD, no LE edema Abdomen: Soft, non-tender, non-distended, BS + x 4.  Extremities: No clubbing, cyanosis or edema. DP/PT/Radials 2+ and equal bilaterally.  Labs   Recent Labs  01/31/16 2352  TROPONINI 0.44*   Lab Results  Component Value Date   WBC 9.6 01/31/2016   HGB 14.2 01/31/2016   HCT 41.6 01/31/2016   MCV 88.5 01/31/2016   PLT 182 01/31/2016    Recent Labs Lab 01/31/16 1935  NA 142  K 3.6  CL 109  CO2 24  BUN 13  CREATININE 0.90  CALCIUM 9.8  GLUCOSE 119*   No results found for: CHOL, HDL, LDLCALC, TRIG No results found for: DDIMER   Radiology/Studies  Dg Chest 2 View  Result Date: 01/31/2016 CLINICAL DATA:  Chest tightness and shortness breath with exertion for the past 5 days. Ex-smoker. EXAM: CHEST  2 VIEW COMPARISON:  01/31/2016. FINDINGS: Normal sized heart. Clear lungs with normal vascularity. Minimal central peribronchial thickening. Mild thoracic spine degenerative changes. IMPRESSION: Interval minimal bronchitic changes. Electronically Signed   By: Beckie SaltsSteven  Reid M.D.   On: 01/31/2016 19:33    ECG  Per my review, tracing from presentation shows NSR with normal axis and normal intervals, no ST elevation or depression anterolateral TWI of unknown chronicity  ASSESSMENT AND PLAN  75yo female with PMH significant only for OA and GERD who [facility] SSCP and [facility] with [facility]STEMI.  # NSTEMI; CP free at rest, otherwise stable.  Recent exertional chest pain consistent with angina. - s/p ASA 324mg  PO x 1, continue 81mg  PO QDAY - treatment dose Enoxaparin - hold P2Y12 inhibitor for now - Atorvastatin - Metoprolol - repeat ECG here - trend troponin until peak - NPO for cath in the morning  # GERD; CP symptoms less consistent with pt's prior GERD symptoms which have been stable - Ranitidine 150mg  PO QDAY  # OA; symptoms well control - hold Celecoxib in the setting of  NSTEMI   Signed, Ollen Grossaul S Hilary Milks,MD 02/01/2016, 1:03 AM

## 2016-02-01 NOTE — Interval H&P Note (Signed)
Cath Lab Visit (complete for each Cath Lab visit)  Clinical Evaluation Leading to the Procedure:   ACS: Yes.    Non-ACS:    Anginal Classification: CCS IV  Anti-ischemic medical therapy: Minimal Therapy (1 class of medications)  Non-Invasive Test Results: No non-invasive testing performed  Prior CABG: No previous CABG      History and Physical Interval Note:  02/01/2016 6:05 PM  Carolyn Burns  has presented today for surgery, with the diagnosis of cp  The various methods of treatment have been discussed with the patient and family. After consideration of risks, benefits and other options for treatment, the patient has consented to  Procedure(s): Left Heart Cath and Coronary Angiography (N/A) as a surgical intervention .  The patient's history has been reviewed, patient examined, no change in status, stable for surgery.  I have reviewed the patient's chart and labs.  Questions were answered to the patient's satisfaction.     Nicki Guadalajarahomas Kelly

## 2016-02-01 NOTE — Progress Notes (Signed)
ANTICOAGULATION CONSULT NOTE - Initial Consult  Pharmacy Consult for Lovenox Indication: chest pain/ACS  Allergies  Allergen Reactions  . Aleve [Naproxen Sodium] Itching and Other (See Comments)    Reaction:  Redness on palms of hands/bottom of feet   . Codeine Nausea Only    Patient Measurements: Height: 5\' 1"  (154.9 cm) Weight: 116 lb (52.6 kg) IBW/kg (Calculated) : 47.8  Vital Signs: Temp: 98.8 F (37.1 C) (08/29 0430) Temp Source: Oral (08/29 0430) BP: 102/55 (08/29 0430) Pulse Rate: 61 (08/29 0430)  Labs:  Recent Labs  01/31/16 1935 01/31/16 2352 02/01/16 0508  HGB 14.2  --  14.0  HCT 41.6  --  41.3  PLT 182  --  174  APTT 27  --   --   LABPROT 13.0  --  13.8  INR 0.98  --  1.06  CREATININE 0.90  --  0.85  TROPONINI  --  0.44* 0.38*    Estimated Creatinine Clearance: 43.2 mL/min (by C-G formula based on SCr of 0.85 mg/dL).   Medical History: Past Medical History:  Diagnosis Date  . Osteoarthritis     Medications:  Prescriptions Prior to Admission  Medication Sig Dispense Refill Last Dose  . celecoxib (CELEBREX) 200 MG capsule Take 200 mg by mouth daily.   01/31/2016 at Unknown time  . cholecalciferol (VITAMIN D) 1000 units tablet Take 2,000 Units by mouth 2 (two) times daily.   01/31/2016 at Unknown time  . estradiol (ESTRACE) 0.5 MG tablet Take 0.5 mg by mouth daily.   01/31/2016 at Unknown time  . Krill Oil 1000 MG CAPS Take 1,000 mg by mouth daily.   01/31/2016 at Unknown time  . progesterone (PROMETRIUM) 100 MG capsule Take 100 mg by mouth daily.   01/31/2016 at Unknown time  . ranitidine (ZANTAC) 150 MG tablet Take 150 mg by mouth daily.   01/31/2016 at Unknown time    Assessment: 75 y.o. F presents with NSTEMI. [facility]. To continue Lovenox for NSTEMI. Troponin elevated at 0.38. CBC wnl. Last dose of Lovenox was at 2200 on 8/28. Discussed with Dr. Tresa EndoKelly and will hold Lovenox for  now until after cath.  Goal of Therapy:  Anti-Xa level 0.6-1 units/ml 4hrs after LMWH dose given Monitor platelets by anticoagulation protocol: Yes   Plan:  Hold enoxaparin 55mg  SQ Q12 prior to cath Monitor CBC, s/s of bleed F/U cath   Enzo BiNathan Marko Skalski, PharmD, [facility] Clinical Pharmacist Pager [phone] 02/01/2016 8:26 AM

## 2016-02-01 NOTE — Progress Notes (Signed)
ANTICOAGULATION CONSULT NOTE - Initial Consult  Pharmacy Consult for Heparin Indication: chest pain/ACS  Allergies  Allergen Reactions  . Aleve [Naproxen Sodium] Itching and Other (See Comments)    Reaction:  Redness on palms of hands/bottom of feet   . Codeine Nausea Only    Patient Measurements: Height: 5\' 1"  (154.9 cm) Weight: 112 lb 10.5 oz (51.1 kg) IBW/kg (Calculated) : 47.8   Vital Signs: Temp: 97.8 F (36.6 C) (08/29 1925) Temp Source: Oral (08/29 1925) BP: 134/61 (08/29 1925) Pulse Rate: 74 (08/29 1925)  Labs:  Recent Labs  01/31/16 1935 01/31/16 2352 02/01/16 0508 02/01/16 1110  HGB 14.2  --  14.0  --   HCT 41.6  --  41.3  --   PLT 182  --  174  --   APTT 27  --   --   --   LABPROT 13.0  --  13.8  --   INR 0.98  --  1.06  --   CREATININE 0.90  --  0.85  --   TROPONINI  --  0.44* 0.38* 0.34*    Estimated Creatinine Clearance: 43.2 mL/min (by C-G formula based on SCr of 0.85 mg/dL).   Medical History: Past Medical History:  Diagnosis Date  . Osteoarthritis     Medications:  Scheduled:  . aspirin EC  81 mg Oral Daily  . atorvastatin  40 mg Oral q1800  . cholecalciferol  2,000 Units Oral BID  . estradiol  0.5 mg Oral Daily  . famotidine  10 mg Oral Daily  . metoprolol tartrate  12.5 mg Oral BID  . progesterone  100 mg Oral Daily  . [START ON 02/02/2016] sodium chloride flush  3 mL Intravenous Q12H    Assessment: 75yo female s/p cath, to start heparin 8hr after sheath removed.  Pt was on Lovenox with last dose at 2200 on 8/28.  Hg, pltc, and INR wnl this AM.  No bleeding problems noted.  Sheath out at 1900, per cath lab flowsheet  Goal of Therapy:  Heparin level 0.3-0.7 units/ml Monitor platelets by anticoagulation protocol: Yes   Plan:  Heparin 750 units/hr, no bolus- to start at 3AM on 8/30 Heparin level 8hr after initiated Daily Heparin level, CBC starting 8/31 Watch for s/s of bleeding  Marisue HumbleKendra Christo Hain, PharmD Clinical  Pharmacist  System- [facility]

## 2016-02-02 ENCOUNTER — Encounter (HOSPITAL_COMMUNITY): Payer: Self-pay | Admitting: Cardiovascular Disease

## 2016-02-02 ENCOUNTER — Other Ambulatory Visit: Payer: Self-pay | Admitting: *Deleted

## 2016-02-02 ENCOUNTER — Inpatient Hospital Stay (HOSPITAL_COMMUNITY): Payer: PPO

## 2016-02-02 ENCOUNTER — Ambulatory Visit (HOSPITAL_COMMUNITY): Payer: PPO

## 2016-02-02 ENCOUNTER — Other Ambulatory Visit (HOSPITAL_COMMUNITY): Payer: PPO

## 2016-02-02 DIAGNOSIS — Z01810 Encounter for preprocedural cardiovascular examination: Secondary | ICD-10-CM

## 2016-02-02 DIAGNOSIS — I25110 Atherosclerotic heart disease of native coronary artery with unstable angina pectoris: Secondary | ICD-10-CM

## 2016-02-02 DIAGNOSIS — J9811 Atelectasis: Secondary | ICD-10-CM | POA: Diagnosis not present

## 2016-02-02 DIAGNOSIS — I2510 Atherosclerotic heart disease of native coronary artery without angina pectoris: Secondary | ICD-10-CM

## 2016-02-02 DIAGNOSIS — E8770 Fluid overload, unspecified: Secondary | ICD-10-CM | POA: Diagnosis not present

## 2016-02-02 DIAGNOSIS — D62 Acute posthemorrhagic anemia: Secondary | ICD-10-CM | POA: Diagnosis not present

## 2016-02-02 DIAGNOSIS — I5181 Takotsubo syndrome: Secondary | ICD-10-CM | POA: Diagnosis not present

## 2016-02-02 DIAGNOSIS — M1990 Unspecified osteoarthritis, unspecified site: Secondary | ICD-10-CM | POA: Diagnosis not present

## 2016-02-02 DIAGNOSIS — D6959 Other secondary thrombocytopenia: Secondary | ICD-10-CM | POA: Diagnosis not present

## 2016-02-02 DIAGNOSIS — D689 Coagulation defect, unspecified: Secondary | ICD-10-CM | POA: Diagnosis not present

## 2016-02-02 DIAGNOSIS — I214 Non-ST elevation (NSTEMI) myocardial infarction: Secondary | ICD-10-CM | POA: Diagnosis not present

## 2016-02-02 DIAGNOSIS — K219 Gastro-esophageal reflux disease without esophagitis: Secondary | ICD-10-CM | POA: Diagnosis not present

## 2016-02-02 DIAGNOSIS — Z79890 Hormone replacement therapy (postmenopausal): Secondary | ICD-10-CM | POA: Diagnosis not present

## 2016-02-02 DIAGNOSIS — Z791 Long term (current) use of non-steroidal anti-inflammatories (NSAID): Secondary | ICD-10-CM | POA: Diagnosis not present

## 2016-02-02 LAB — BASIC METABOLIC PANEL
ANION GAP: 9 (ref 5–15)
BUN: 10 mg/dL (ref 6–20)
CALCIUM: 9.2 mg/dL (ref 8.9–10.3)
CO2: 23 mmol/L (ref 22–32)
Chloride: 107 mmol/L (ref 101–111)
Creatinine, Ser: 0.84 mg/dL (ref 0.44–1.00)
GFR calc Af Amer: >60 mL/min (ref >60)
Glucose, Bld: 104 mg/dL — ABNORMAL HIGH (ref 65–99)
POTASSIUM: 3.8 mmol/L (ref 3.5–5.1)
SODIUM: 139 mmol/L (ref 135–145)

## 2016-02-02 LAB — CBC
HCT: 41.4 % (ref 36.0–46.0)
Hemoglobin: 13.7 g/dL (ref 12.0–15.0)
MCH: 29.7 pg (ref 26.0–34.0)
MCHC: 33.1 g/dL (ref 30.0–36.0)
MCV: 89.8 fL (ref 78.0–100.0)
PLATELETS: 187 10*3/uL (ref 150–400)
RBC: 4.61 MIL/uL (ref 3.87–5.11)
RDW: 12.5 % (ref 11.5–15.5)
WBC: 10.9 10*3/uL — AB (ref 4.0–10.5)

## 2016-02-02 LAB — VAS US DOPPLER PRE CABG
LEFT ECA DIAS: 0 cm/s
LEFT VERTEBRAL DIAS: 6 cm/s
Left CCA dist dias: 13 cm/s
Left CCA dist sys: 46 cm/s
Left CCA prox dias: 10 cm/s
Left CCA prox sys: 51 cm/s
Left ICA dist dias: -20 cm/s
Left ICA dist sys: -67 cm/s
Left ICA prox dias: 15 cm/s
Left ICA prox sys: 55 cm/s
RIGHT ECA DIAS: 0 cm/s
RIGHT VERTEBRAL DIAS: -11 cm/s
Right CCA prox dias: -9 cm/s
Right CCA prox sys: -50 cm/s
Right cca dist sys: -53 cm/s

## 2016-02-02 LAB — SPIROMETRY WITH GRAPH
FEF 25-75 Post: 2.86 L/sec
FEF 25-75 Pre: 2.65 L/sec
FEF2575-%Change-Post: 7 %
FEF2575-%Pred-Post: 193 %
FEF2575-%Pred-Pre: 179 %
FEV1-%Change-Post: 1 %
FEV1-%Pred-Post: 93 %
FEV1-%Pred-Pre: 92 %
FEV1-Post: 1.72 L
FEV1-Pre: 1.69 L
FEV1FVC-%Change-Post: -10 %
FEV1FVC-%Pred-Pre: 127 %
FEV6-%Change-Post: 13 %
FEV6-%Pred-Post: 86 %
FEV6-%Pred-Pre: 76 %
FEV6-Post: 2.01 L
FEV6-Pre: 1.77 L
FEV6FVC-%Pred-Post: 105 %
FEV6FVC-%Pred-Pre: 105 %
FVC-%Change-Post: 13 %
FVC-%Pred-Post: 82 %
FVC-%Pred-Pre: 72 %
FVC-Post: 2.01 L
FVC-Pre: 1.77 L
Post FEV1/FVC ratio: 86 %
Post FEV6/FVC ratio: 100 %
Pre FEV1/FVC ratio: 96 %
Pre FEV6/FVC Ratio: 100 %

## 2016-02-02 LAB — HEMOGLOBIN A1C
Hgb A1c MFr Bld: 5.8 % — ABNORMAL HIGH (ref 4.8–5.6)
Mean Plasma Glucose: 120 mg/dL

## 2016-02-02 LAB — URINE MICROSCOPIC-ADD ON

## 2016-02-02 LAB — SURGICAL PCR SCREEN
MRSA, PCR: NEGATIVE
Staphylococcus aureus: POSITIVE — AB

## 2016-02-02 LAB — URINALYSIS, ROUTINE W REFLEX MICROSCOPIC
Bilirubin Urine: NEGATIVE
Glucose, UA: NEGATIVE mg/dL
Ketones, ur: NEGATIVE mg/dL
Nitrite: NEGATIVE
Protein, ur: NEGATIVE mg/dL
Specific Gravity, Urine: 1.01 (ref 1.005–1.030)
pH: 6.5 (ref 5.0–8.0)

## 2016-02-02 LAB — HEPARIN LEVEL (UNFRACTIONATED)
HEPARIN UNFRACTIONATED: 0.13 [IU]/mL — AB (ref 0.30–0.70)
Heparin Unfractionated: 0.27 IU/mL — ABNORMAL LOW (ref 0.30–0.70)

## 2016-02-02 LAB — ECHOCARDIOGRAM COMPLETE
Height: 61 in
Weight: 1802.48 oz

## 2016-02-02 LAB — CARDIAC CATHETERIZATION: Cath EF Quantitative: 25 %

## 2016-02-02 MED ORDER — ALBUTEROL SULFATE (2.5 MG/3ML) 0.083% IN NEBU
2.5000 mg | INHALATION_SOLUTION | Freq: Once | RESPIRATORY_TRACT | Status: AC
  Administered 2016-02-02: 2.5 mg via RESPIRATORY_TRACT

## 2016-02-02 MED ORDER — PERFLUTREN LIPID MICROSPHERE
1.0000 mL | INTRAVENOUS | Status: AC | PRN
  Administered 2016-02-02: 2 mL via INTRAVENOUS
  Filled 2016-02-02: qty 10

## 2016-02-02 MED ORDER — SODIUM CHLORIDE 0.9 % IV SOLN
INTRAVENOUS | Status: DC
  Administered 2016-02-07 – 2016-02-10 (×3): via INTRAVENOUS

## 2016-02-02 MED ORDER — FUROSEMIDE 10 MG/ML IJ SOLN: 20.0000 mg | Freq: Two times a day (BID) | INTRAMUSCULAR | Status: DC

## 2016-02-02 MED ORDER — FUROSEMIDE 10 MG/ML IJ SOLN
20.0000 mg | Freq: Every day | INTRAMUSCULAR | Status: DC
  Administered 2016-02-02: 20 mg via INTRAVENOUS
  Filled 2016-02-02: qty 2

## 2016-02-02 MED ORDER — LISINOPRIL 5 MG PO TABS
5.0000 mg | ORAL_TABLET | Freq: Every day | ORAL | Status: DC
  Administered 2016-02-02 – 2016-02-09 (×8): 5 mg via ORAL
  Filled 2016-02-02 (×8): qty 1

## 2016-02-02 MED ORDER — FUROSEMIDE 20 MG PO TABS
20.0000 mg | ORAL_TABLET | Freq: Every day | ORAL | Status: DC
  Administered 2016-02-03 – 2016-02-09 (×7): 20 mg via ORAL
  Filled 2016-02-02 (×7): qty 1

## 2016-02-02 NOTE — Consult Note (Signed)
[address]       [place] [address]             [phone]        SHAWNTAE LOWY [facility] Medical Record #[id number] Date of Birth: 12/08/1940  Referring: Nicki Guadalajara M.D.  Primary Care: Gweneth Dimitri, MD  Chief Complaint:    Chief Complaint  Patient presents with  . Elevated Troponin    0.36  . Chest Pain  Patient examined, coronary angiogram and echocardiogram personally reviewed and counseled with patient and family  History of Present Illness:     Very nice 75 year old Caucasian female admitted with recent chest pain and positive cardiac enzymes consistent with subendocardial myocardial infarction. She underwent urgent cardiac catheterization late yesterday showing severe multivessel coronary artery disease. Her ejection fraction was 25% with apical hypokinesia. LVEDP was 25. Subsequent echocardiogram shows LV dysfunction without significant MR. The patient has been hemodynamically stable. Chest pain has resolved on IV heparin. Because of her severe three-vessel CAD and her positive enzymes she is felt to be a potential candidate for CABG.   Current Activity/ Functional Status: Patient has been active at home with minimal activity limitations until onset of chest pain within the past 5 days   Zubrod Score: At the time of surgery this patient's most appropriate activity status/level should be described as: []     0    Normal activity, no symptoms [x]     1    Restricted in physical strenuous activity but ambulatory, able to do out light work []     2    Ambulatory and capable of self care, unable to do work activities, up and about                 more than 50%  Of the time                            []     3    Only limited self care, in bed greater than 50% of waking hours []     4    Completely disabled, no self care, confined to bed or chair []     5    Moribund  Past Medical History:  Diagnosis Date  . Osteoarthritis     Past Surgical  History:  Procedure Laterality Date  . CARDIAC CATHETERIZATION N/A 02/01/2016   Procedure: Left Heart Cath and Coronary Angiography;  Surgeon: Lennette Bihari, MD;  Location: [facility] INVASIVE CV LAB;  Service: Cardiovascular;  Laterality: N/A;    History  Smoking Status  . Never Smoker  Smokeless Tobacco  . Never Used    History  Alcohol Use No    Social History   Social History  . Marital status: Married    Spouse name: N/A  . Number of children: N/A  . Years of education: N/A   Occupational History  . Not on file.   Social History Main Topics  . Smoking status: Never Smoker  . Smokeless tobacco: Never Used  . Alcohol use No  . Drug use: No  . Sexual activity: Not on file   Other Topics Concern  . Not on file   Social History Narrative  . No narrative on file    Allergies  Allergen Reactions  . Aleve [Naproxen Sodium] Itching and Other (See Comments)    Reaction:  Redness on palms of hands/bottom of feet   . Codeine Nausea  Only    Current Facility-Administered Medications  Medication Dose Route Frequency Provider Last Rate Last Dose  . 0.9 %  sodium chloride infusion  250 mL Intravenous PRN Lennette Bihari, MD      . 0.9 %  sodium chloride infusion   Intravenous Continuous Azalee Course, MD 25 mL/hr at 02/02/16 0444    . acetaminophen (TYLENOL) tablet 650 mg  650 mg Oral Q4H PRN Azalee Course, MD      . acetaminophen (TYLENOL) tablet 650 mg  650 mg Oral Q4H PRN Lennette Bihari, MD      . ALPRAZolam Prudy Feeler) tablet 1 mg  1 mg Oral BID PRN Lennette Bihari, MD      . aspirin EC tablet 81 mg  81 mg Oral Daily Azalee Course, MD   81 mg at 02/02/16 1052  . atorvastatin (LIPITOR) tablet 40 mg  40 mg Oral q1800 Azalee Course, MD   40 mg at 02/02/16 1738  . cholecalciferol (VITAMIN D) tablet 2,000 Units  2,000 Units Oral BID Azalee Course, MD   2,000 Units at 02/02/16 1052  . diazepam (VALIUM) tablet 5 mg  5 mg Oral Q6H PRN Lennette Bihari, MD      . estradiol (ESTRACE)  tablet 0.5 mg  0.5 mg Oral Daily Azalee Course, MD   0.5 mg at 02/02/16 1053  . famotidine (PEPCID) tablet 10 mg  10 mg Oral Daily Azalee Course, MD   10 mg at 02/02/16 1052  . [START ON 02/03/2016] furosemide (LASIX) tablet 20 mg  20 mg Oral Daily Rollene Rotunda, MD      . heparin ADULT infusion 100 units/mL (25000 units/290mL sodium chloride 0.45%)  900 Units/hr Intravenous Continuous Cherre Huger, RPH 9 mL/hr at 02/02/16 1217 900 Units/hr at 02/02/16 1217  . lisinopril (PRINIVIL,ZESTRIL) tablet 5 mg  5 mg Oral Daily Rollene Rotunda, MD   5 mg at 02/02/16 1251  . metoprolol tartrate (LOPRESSOR) tablet 12.5 mg  12.5 mg Oral BID Azalee Course, MD   12.5 mg at 02/02/16 1052  . nitroGLYCERIN (NITROSTAT) SL tablet 0.4 mg  0.4 mg Sublingual Q5 Min x 3 PRN Azalee Course, MD      . ondansetron [facility]) injection 4 mg  4 mg Intravenous Q6H PRN Azalee Course, MD      . ondansetron [facility]) injection 4 mg  4 mg Intravenous Q6H PRN Lennette Bihari, MD      . progesterone (PROMETRIUM) capsule 100 mg  100 mg Oral Daily Azalee Course, MD   100 mg at 02/02/16 1052  . sodium chloride flush (NS) 0.9 % injection 3 mL  3 mL Intravenous Q12H Lennette Bihari, MD      . sodium chloride flush (NS) 0.9 % injection 3 mL  3 mL Intravenous PRN Lennette Bihari, MD      . zolpidem (AMBIEN) tablet 5 mg  5 mg Oral QHS PRN Lennette Bihari, MD        Prescriptions Prior to Admission  Medication Sig Dispense Refill Last Dose  . celecoxib (CELEBREX) 200 MG capsule Take 200 mg by mouth daily.   01/31/2016 at Unknown time  . cholecalciferol (VITAMIN D) 1000 units tablet Take 2,000 Units by mouth 2 (two) times daily.   01/31/2016 at Unknown time  . estradiol (ESTRACE) 0.5 MG tablet Take 0.5 mg by mouth daily.   01/31/2016 at Unknown time  . Krill Oil 1000  MG CAPS Take 1,000 mg by mouth daily.   01/31/2016 at Unknown time  . progesterone (PROMETRIUM) 100 MG capsule Take 100 mg by mouth daily.   01/31/2016 at Unknown time  .  ranitidine (ZANTAC) 150 MG tablet Take 150 mg by mouth daily.   01/31/2016 at Unknown time    History reviewed. No pertinent family history.   Review of Systems:       Cardiac Review of Systems: Y or N  Chest Pain [   Yes ]  Resting SOB [  no ] Exertional SOB  [ no ]  Orthopnea [no  ]   Pedal Edema [ no  ]    Palpitations [ yes ] Syncope  [  no]   Presyncope [  no ]  General Review of Systems: [Y] = yes [  ]=no Constitional: recent weight change [  ]; anorexia [  ]; fatigue [  ]; nausea [  ]; night sweats [  ]; fever [  ]; or chills [  ]                                                               Dental: poor dentition[  ]; Last Dentist visit: Every 6 months   Eye : blurred vision [  ]; diplopia [   ]; vision changes [  ];  Amaurosis fugax[  ]; Resp: cough [  ];  wheezing[  ];  hemoptysis[  ]; shortness of breath[  ]; paroxysmal nocturnal dyspnea[  ]; dyspnea on exertion[  ]; or orthopnea[  ];  GI:  gallstones[  ], vomiting[  ];  dysphagia[  ]; melena[  ];  hematochezia [  ]; heartburn[ yes ];   Hx of  Colonoscopy[ negative  ]; GU: kidney stones [  ]; hematuria[  ];   dysuria [  ];  nocturia[  ];  history of     obstruction [  ]; urinary frequency [  ]             Skin: rash, swelling[  ];, hair loss[  ];  peripheral edema[  ];  or itching[  ]; Musculosketetal: myalgias[  ];  joint swelling[  ];  joint erythema[  ];  joint pain[  ];  back pain[  ];  Heme/Lymph: bruising[  ];  bleeding[  ];  anemia[  ];  Neuro: TIA[  ];  headaches[ mild ];  stroke[  ];  vertigo[  ];  seizures[  ];   paresthesias[  ];  difficulty walking[  ]; right hand dominant  Psych:depression[  ]; anxiety[  ];  Endocrine: diabetes[  ];  thyroid dysfunction[  ];  Immunizations: Flu [  ]; Pneumococcal[  ];  Other:  Physical Exam: BP (!) 104/58 (BP Location: Right Arm)   Pulse 71   Temp 98.7 F (37.1 C) (Oral)   Resp 20   Ht 5\' 1"  (1.549 m)   Wt 112 lb 10.5 oz (51.1 kg)   SpO2 100%   BMI 21.29 kg/m          Physical Exam  General: Very pleasant and alert Caucasian female, slight in build. HEENT: Normocephalic pupils equal , dentition adequate Neck: Supple without JVD, adenopathy, or bruit Chest: Clear to auscultation, symmetrical breath sounds, no rhonchi,  no tenderness             or deformity Cardiovascular: Regular rate and rhythm, no murmur, no gallop, peripheral pulses             palpable in all extremities Abdomen:  Soft, nontender, no palpable mass or organomegaly Extremities: Warm, well-perfused, no clubbing cyanosis edema or tenderness,              no venous stasis changes of the legs Rectal/GU: Deferred Neuro: Grossly non--focal and symmetrical throughout Skin: Clean and dry without rash or ulceration   Diagnostic Studies & Laboratory data:   Coronary arteriograms show severe three-vessel disease with a nondominant RCA and moderate to severe LV dysfunction  Recent Radiology Findings:   PA lateral chest x-ray performed this admission personally reviewed showing no active disease   I have independently reviewed the above radiologic studies.  Recent Lab Findings: Lab Results  Component Value Date   WBC 10.9 (H) 02/02/2016   HGB 13.7 02/02/2016   HCT 41.4 02/02/2016   PLT 187 02/02/2016   GLUCOSE 104 (H) 02/02/2016   CHOL 200 02/01/2016   TRIG 131 02/01/2016   HDL 34 (L) 02/01/2016   LDLCALC 140 (H) 02/01/2016   NA 139 02/02/2016   K 3.8 02/02/2016   CL 107 02/02/2016   CREATININE 0.84 02/02/2016   BUN 10 02/02/2016   CO2 23 02/02/2016   INR 1.06 02/01/2016   HGBA1C 5.8 (H) 02/01/2016      Assessment / Plan:      Severe multivessel coronary disease. 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 Continue medical therapy for now with follow-up echocardiogram. We'll follow. 02/02/2016 9:00 PM

## 2016-02-02 NOTE — Progress Notes (Signed)
Patient Name: Carolyn Burns Date of Encounter: 02/02/2016  Hospital Problem List     Active Problems:   NSTEMI (non-ST elevated myocardial infarction) [facility](HCC)    Patient Profile   Pt is a 75yo female with PMH significant only for OA and GERD who presented with a chest pain and NSTEMI.    Subjective   Cath with results as below.  No chest pain.  No SOB.    Inpatient Medications    . aspirin EC  81 mg Oral Daily  . atorvastatin  40 mg Oral q1800  . cholecalciferol  2,000 Units Oral BID  . estradiol  0.5 mg Oral Daily  . famotidine  10 mg Oral Daily  . furosemide  20 mg Intravenous Daily  . metoprolol tartrate  12.5 mg Oral BID  . progesterone  100 mg Oral Daily  . sodium chloride flush  3 mL Intravenous Q12H    Vital Signs    Vitals:   02/02/16 0400 02/02/16 0414 02/02/16 0600 02/02/16 0800  BP:  123/67 120/71 125/65  Pulse: 74 76 71 79  Resp: 13 19 19 18   Temp:    98.2 F (36.8 C)  TempSrc:    Oral  SpO2: 95% 96% 93% 96%  Weight:      Height:        Intake/Output Summary (Last 24 hours) at 02/02/16 1002 Last data filed at 02/02/16 0700  Gross per 24 hour  Intake           467.58 ml  Output              750 ml  Net          -282.42 ml   Filed Weights   01/31/16 1913 02/01/16 1925  Weight: 116 lb (52.6 kg) 112 lb 10.5 oz (51.1 kg)    Physical Exam    GEN: Well nourished, well developed, in no no acute distress.  Neck: Supple, no JVD, carotid bruits, or masses. Cardiac: RRR, no murmurs rubs, or gallops. No clubbing, cyanosis, no edema.  Radials/DP/PT 2+ and equal bilaterally, cath access site OK.  Respiratory:  Respirations  regular and unlabored, clear to auscultation bilaterally. GI: Soft, nontender, nondistended, BS + x 4. Neuro:  Strength and sensation are intact.   Labs    CBC  Recent Labs  02/01/16 0508 02/02/16 0232  WBC 9.0 10.9*  HGB 14.0 13.7  HCT 41.3 41.4  MCV 89.0 89.8  PLT 174 187   Basic Metabolic Panel  Recent  Labs  40/98/1108/29/17 0508 02/02/16 0232  NA 141 139  K 3.8 3.8  CL 108 107  CO2 25 23  GLUCOSE 114* 104*  BUN 9 10  CREATININE 0.85 0.84  CALCIUM 9.3 9.2   Liver Function Tests No results for input(s): AST, ALT, ALKPHOS, BILITOT, PROT, ALBUMIN in the last 72 hours. No results for input(s): LIPASE, AMYLASE in the last 72 hours. Cardiac Enzymes  Recent Labs  01/31/16 2352 02/01/16 0508 02/01/16 1110  TROPONINI 0.44* 0.38* 0.34*   BNP Invalid input(s): POCBNP D-Dimer No results for input(s): DDIMER in the last 72 hours. Hemoglobin A1C  Recent Labs  02/01/16 0508  HGBA1C 5.8*   Fasting Lipid Panel  Recent Labs  02/01/16 0508  CHOL 200  HDL 34*  LDLCALC 140*  TRIG 131  CHOLHDL 5.9   Thyroid Function Tests No results for input(s): TSH, T4TOTAL, T3FREE, THYROIDAB in the last 72 hours.  Invalid input(s): FREET3   CATH  LM lesion, 75 %stenosed.  Ost LAD lesion, 70 %stenosed.  Prox LAD to Mid LAD lesion, 80 %stenosed.  LPDA lesion, 70 %stenosed.  Ost Cx to Prox Cx lesion, 50 %stenosed.  Prox Cx lesion, 70 %stenosed.  1st Mrg lesion, 90 %stenosed.  There is severe left ventricular systolic dysfunction.  LV end diastolic pressure is moderately elevated.   Severe two-vessel coronary artery disease with separate ostium to the LAD and circumflex vessel with ostial 70% diffuse LAD stenosis followed by 70%and 80% proximal to mid stenoses; 50 and 70% proximal circumflex stenosis with 90% stenosis in a large OM1 vessel and 70% distal stenosis in the PLA branch of the dominant circumflex vessel, and small nondominant RCA.  Severe LV dysfunction with significant hypo-to akinesis involving the mid distal anterolateral wall, apex and mid distal inferior wall.  Ejection fraction is 25%.  Telemetry    NSR  ECG    NSR, rate 84, QT prolonged, evolving anterolateral T wave changes with Twaves inverting  Radiology    Dg Chest 2 View  Result Date:  01/31/2016 CLINICAL DATA:  Chest tightness and shortness breath with exertion for the past 5 days. Ex-smoker. EXAM: CHEST  2 VIEW COMPARISON:  01/31/2016. FINDINGS: Normal sized heart. Clear lungs with normal vascularity. Minimal central peribronchial thickening. Mild thoracic spine degenerative changes. IMPRESSION: Interval minimal bronchitic changes. Electronically Signed   By: Beckie Salts M.D.   On: 01/31/2016 19:33    Assessment & Plan    NSTEMI:    See below.   CARDIOMYOPATHY:    I reviewed the films with multiple of my cardiology partners.  This LV along with the clinical story and the small rise in troponin all looks like a Takatsubo cardiomyopathy.  I had a long discussion with the patient and her family and spoke as well with Dr. Donata Clay.  She may well need bypass in the future for her severe disease.  However, I would like to see if her LV recovers presuming this is [place].  Continue medical management.   I will change to PO Lasix and add ACE inhibitor.  Likely image with echo in the next couple of days to see if EF is improved.    Signed, Rollene Rotunda, MD  02/02/2016, 10:02 AM

## 2016-02-02 NOTE — Progress Notes (Signed)
  Echocardiogram 2D Echocardiogram with Definity has been performed.  Janalyn [place], Virgil Lightner R 02/02/2016, 4:06 PM

## 2016-02-02 NOTE — Progress Notes (Signed)
ANTICOAGULATION CONSULT NOTE - Follow-up Consult  Pharmacy Consult for Heparin Indication: chest pain/ACS  Allergies  Allergen Reactions  . Aleve [Naproxen Sodium] Itching and Other (See Comments)    Reaction:  Redness on palms of hands/bottom of feet   . Codeine Nausea Only   Patient Measurements: Height: 5\' 1"  (154.9 cm) Weight: 112 lb 10.5 oz (51.1 kg) IBW/kg (Calculated) : 47.8 Heparin dosing weight ~ 51.1kg   Vital Signs: Temp: 98.7 F (37.1 C) (08/30 1934) Temp Source: Oral (08/30 1934) BP: 104/58 (08/30 1934) Pulse Rate: 71 (08/30 1934)  Labs:  Recent Labs  01/31/16 1935 01/31/16 2352 02/01/16 0508 02/01/16 1110 02/02/16 0232 02/02/16 1055 02/02/16 2050  HGB 14.2  --  14.0  --  13.7  --   --   HCT 41.6  --  41.3  --  41.4  --   --   PLT 182  --  174  --  187  --   --   APTT 27  --   --   --   --   --   --   LABPROT 13.0  --  13.8  --   --   --   --   INR 0.98  --  1.06  --   --   --   --   HEPARINUNFRC  --   --   --   --   --  0.13* 0.27*  CREATININE 0.90  --  0.85  --  0.84  --   --   TROPONINI  --  0.44* 0.38* 0.34*  --   --   --     Estimated Creatinine Clearance: 43.7 mL/min (by C-G formula based on SCr of 0.84 mg/dL).  Assessment: 75yo female s/p cath on 8/29 for NSTEMI. Pharmacy consulted to start Heparin 8hr after sheath removed (1900PM on 8/29). Patient had received Lovenox, with last dose given at on 8/28. Cath report states severe 2-vessel CAD and surgery consulted for possible CABG. Estimated EF 25%. Hgb and platelets stable. No signs or symptoms of bleeding noted.   Heparin level this evening is slightly subtherapeutic at 0.27, will increase rate and recheck a level in the morning. No bleeding or trouble with the drip per RN.  Goal of Therapy:  Heparin level 0.3-0.7 units/ml Monitor platelets by anticoagulation protocol: Yes   Plan:  Increase heparin gtt to 1000 units/hr Heparin level in 8hrs Daily Heparin level and CBC  Monitor for s/s  of bleeding  Thank you for allowing us to participate in this patients care. Signe Coltonya C Jshon Ibe, PharmD Pager: [phone] 02/02/16 9:24 PM

## 2016-02-02 NOTE — Progress Notes (Signed)
Pre-op Cardiac Surgery  Carotid Findings:   Findings are consistent with a 1-39 percent stenosis involving the right internal carotid artery and the left internal carotid artery. The vertebral arteries demonstrate antegrade flow.  Upper Extremity Right Left  Brachial Pressures 122 123  Radial Waveforms Biphasic Biphasic  Ulnar Waveforms Biphasic Biphasic  Palmar Arch (Allen's Test) Palmar waveforms remain within normal limits with radial and ulnar compression Palmar waveforms remain within normal limits with radial and ulnar compression    Lower  Extremity Right Left  Dorsalis Pedis 105 Biphasic 101 Triphasic  Posterior Tibial 125 Triphasic 137 Triphasic  Ankle/Brachial Indices 1.02 1.11    Findings:   Bilateral ABI's demonstrate values within normal limits except for bilateral dorsalis pedis values which suggest mild arterial occlusive disease.

## 2016-02-02 NOTE — Progress Notes (Signed)
ANTICOAGULATION CONSULT NOTE - Follow-up Consult  Pharmacy Consult for Heparin Indication: chest pain/ACS  Allergies  Allergen Reactions  . Aleve [Naproxen Sodium] Itching and Other (See Comments)    Reaction:  Redness on palms of hands/bottom of feet   . Codeine Nausea Only   Patient Measurements: Height: 5\' 1"  (154.9 cm) Weight: 112 lb 10.5 oz (51.1 kg) IBW/kg (Calculated) : 47.8 Heparin dosing weight ~ 51.1kg   Vital Signs: Temp: 97.8 F (36.6 C) (08/30 1145) Temp Source: Oral (08/30 1145) BP: 136/84 (08/30 1145) Pulse Rate: 62 (08/30 1145)  Labs:  Recent Labs  01/31/16 1935 01/31/16 2352 02/01/16 0508 02/01/16 1110 02/02/16 0232 02/02/16 1055  HGB 14.2  --  14.0  --  13.7  --   HCT 41.6  --  41.3  --  41.4  --   PLT 182  --  174  --  187  --   APTT 27  --   --   --   --   --   LABPROT 13.0  --  13.8  --   --   --   INR 0.98  --  1.06  --   --   --   HEPARINUNFRC  --   --   --   --   --  0.13*  CREATININE 0.90  --  0.85  --  0.84  --   TROPONINI  --  0.44* 0.38* 0.34*  --   --     Estimated Creatinine Clearance: 43.7 mL/min (by C-G formula based on SCr of 0.84 mg/dL).  Assessment: 75yo female s/p cath on 8/29 for NSTEMI. Pharmacy consulted to start Heparin 8hr after sheath removed (1900PM on 8/29). Patient had received Lovenox, with last dose given at on 8/28. Cath report states severe 2-vessel CAD and surgery consulted for possible CABG. Estimated EF 25%. Hgb and platelets stable. No signs or symptoms of bleeding noted.   Goal of Therapy:  Heparin level 0.3-0.7 units/ml Monitor platelets by anticoagulation protocol: Yes   Plan:  Increase heparin gtt to 900 units/hr Heparin level in 8hrs Daily Heparin level and CBC  Monitor for s/s of bleeding  York CeriseKatherine Cook, PharmD Pharmacy Resident  Pager [phone] 02/02/16 12:08 PM

## 2016-02-03 DIAGNOSIS — I214 Non-ST elevation (NSTEMI) myocardial infarction: Secondary | ICD-10-CM | POA: Diagnosis not present

## 2016-02-03 DIAGNOSIS — D62 Acute posthemorrhagic anemia: Secondary | ICD-10-CM | POA: Diagnosis not present

## 2016-02-03 DIAGNOSIS — K219 Gastro-esophageal reflux disease without esophagitis: Secondary | ICD-10-CM | POA: Diagnosis not present

## 2016-02-03 DIAGNOSIS — M1990 Unspecified osteoarthritis, unspecified site: Secondary | ICD-10-CM | POA: Diagnosis not present

## 2016-02-03 DIAGNOSIS — I5181 Takotsubo syndrome: Secondary | ICD-10-CM | POA: Diagnosis not present

## 2016-02-03 DIAGNOSIS — Z79890 Hormone replacement therapy (postmenopausal): Secondary | ICD-10-CM | POA: Diagnosis not present

## 2016-02-03 DIAGNOSIS — I25110 Atherosclerotic heart disease of native coronary artery with unstable angina pectoris: Secondary | ICD-10-CM | POA: Diagnosis not present

## 2016-02-03 DIAGNOSIS — J9811 Atelectasis: Secondary | ICD-10-CM | POA: Diagnosis not present

## 2016-02-03 DIAGNOSIS — Z791 Long term (current) use of non-steroidal anti-inflammatories (NSAID): Secondary | ICD-10-CM | POA: Diagnosis not present

## 2016-02-03 DIAGNOSIS — D689 Coagulation defect, unspecified: Secondary | ICD-10-CM | POA: Diagnosis not present

## 2016-02-03 DIAGNOSIS — E8770 Fluid overload, unspecified: Secondary | ICD-10-CM | POA: Diagnosis not present

## 2016-02-03 DIAGNOSIS — D6959 Other secondary thrombocytopenia: Secondary | ICD-10-CM | POA: Diagnosis not present

## 2016-02-03 LAB — LIPID PANEL
Cholesterol: 190 mg/dL (ref 0–200)
HDL: 36 mg/dL — ABNORMAL LOW (ref >40)
LDL Cholesterol: 127 mg/dL — ABNORMAL HIGH (ref 0–99)
Total CHOL/HDL Ratio: 5.3 RATIO
Triglycerides: 137 mg/dL (ref <150)
VLDL: 27 mg/dL (ref 0–40)

## 2016-02-03 LAB — CBC
HEMATOCRIT: 45.5 % (ref 36.0–46.0)
HEMOGLOBIN: 15.3 g/dL — AB (ref 12.0–15.0)
MCH: 30.1 pg (ref 26.0–34.0)
MCHC: 33.6 g/dL (ref 30.0–36.0)
MCV: 89.6 fL (ref 78.0–100.0)
Platelets: 199 10*3/uL (ref 150–400)
RBC: 5.08 MIL/uL (ref 3.87–5.11)
RDW: 12.5 % (ref 11.5–15.5)
WBC: 10.1 10*3/uL (ref 4.0–10.5)

## 2016-02-03 LAB — COMPREHENSIVE METABOLIC PANEL
ALT: 15 U/L (ref 14–54)
AST: 20 U/L (ref 15–41)
Albumin: 3.8 g/dL (ref 3.5–5.0)
Alkaline Phosphatase: 39 U/L (ref 38–126)
Anion gap: 8 (ref 5–15)
BUN: 16 mg/dL (ref 6–20)
CO2: 26 mmol/L (ref 22–32)
Calcium: 9.5 mg/dL (ref 8.9–10.3)
Chloride: 104 mmol/L (ref 101–111)
Creatinine, Ser: 0.98 mg/dL (ref 0.44–1.00)
GFR calc Af Amer: >60 mL/min (ref >60)
GFR calc non Af Amer: 55 mL/min — ABNORMAL LOW (ref >60)
Glucose, Bld: 113 mg/dL — ABNORMAL HIGH (ref 65–99)
Potassium: 4 mmol/L (ref 3.5–5.1)
Sodium: 138 mmol/L (ref 135–145)
Total Bilirubin: 0.8 mg/dL (ref 0.3–1.2)
Total Protein: 6.4 g/dL — ABNORMAL LOW (ref 6.5–8.1)

## 2016-02-03 LAB — TSH: TSH: 2.772 u[IU]/mL (ref 0.350–4.500)

## 2016-02-03 LAB — HEPARIN LEVEL (UNFRACTIONATED): Heparin Unfractionated: 0.45 IU/mL (ref 0.30–0.70)

## 2016-02-03 NOTE — Progress Notes (Signed)
Patient Name: MONIFAH FREEHLING Date of Encounter: 02/03/2016  Hospital Problem List     Active Problems:   NSTEMI (non-ST elevated myocardial infarction) [facility])    Patient Profile   Pt is a 75yo female with PMH significant only for OA and GERD who presented with a chest pain and NSTEMI.  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    Subjective   Cath with results as below.  No chest pain.  No SOB.    Inpatient Medications    . aspirin EC  81 mg Oral Daily  . atorvastatin  40 mg Oral q1800  . cholecalciferol  2,000 Units Oral BID  . estradiol  0.5 mg Oral Daily  . famotidine  10 mg Oral Daily  . furosemide  20 mg Oral Daily  . lisinopril  5 mg Oral Daily  . metoprolol tartrate  12.5 mg Oral BID  . progesterone  100 mg Oral Daily  . sodium chloride flush  3 mL Intravenous Q12H    Vital Signs    Vitals:   02/02/16 1934 02/03/16 0000 02/03/16 0400 02/03/16 0749  BP: (!) 104/58 108/65  116/67  Pulse: 71  63 65  Resp: 20  (!) 22 19  Temp: 98.7 F (37.1 C) 98 F (36.7 C)  98.3 F (36.8 C)  TempSrc: Oral Oral  Oral  SpO2: 100%  96% 96%  Weight:      Height:        Intake/Output Summary (Last 24 hours) at 02/03/16 0802 Last data filed at 02/03/16 0700  Gross per 24 hour  Intake           219.08 ml  Output              200 ml  Net            19.08 ml   Filed Weights   01/31/16 1913 02/01/16 1925  Weight: 116 lb (52.6 kg) 112 lb 10.5 oz (51.1 kg)    Physical Exam    GEN: Well nourished, well developed, in no no acute distress.  Neck: Supple, no JVD, carotid bruits, or masses. Cardiac: RRR, no murmurs rubs, or gallops. No clubbing, cyanosis, no edema.  Radials/DP/PT 2+ and equal bilaterally, cath access site OK.  Respiratory:  Respirations  regular and unlabored, clear to auscultation bilaterally. GI: Soft, nontender, nondistended, BS + x 4. Neuro:  Strength and sensation are intact.   Labs     CBC  Recent Labs  02/02/16 0232 02/03/16 0313  WBC 10.9* 10.1  HGB 13.7 15.3*  HCT 41.4 45.5  MCV 89.8 89.6  PLT 187 199   Basic Metabolic Panel  Recent Labs  02/02/16 0232 02/03/16 0313  NA 139 138  K 3.8 4.0  CL 107 104  CO2 23 26  GLUCOSE 104* 113*  BUN 10 16  CREATININE 0.84 0.98  CALCIUM 9.2 9.5   Liver Function Tests  Recent Labs  02/03/16 0313  AST 20  ALT 15  ALKPHOS 39  BILITOT 0.8  PROT 6.4*  ALBUMIN 3.8   No results for input(s): LIPASE, AMYLASE in the last 72 hours. Cardiac Enzymes  Recent Labs  01/31/16 2352 02/01/16 0508 02/01/16 1110  TROPONINI 0.44* 0.38* 0.34*   BNP Invalid input(s): POCBNP D-Dimer No results for input(s): DDIMER in the last 72 hours. Hemoglobin A1C  Recent Labs  02/01/16 0508  HGBA1C 5.8*   Fasting Lipid Panel  Recent Labs  02/03/16 0313  CHOL 190  HDL 36*  LDLCALC 127*  TRIG 137  CHOLHDL 5.3   Thyroid Function Tests  Recent Labs  02/03/16 0313  TSH 2.772     CATH  LM lesion, 75 %stenosed.  Ost LAD lesion, 70 %stenosed.  Prox LAD to Mid LAD lesion, 80 %stenosed.  LPDA lesion, 70 %stenosed.  Ost Cx to Prox Cx lesion, 50 %stenosed.  Prox Cx lesion, 70 %stenosed.  1st Mrg lesion, 90 %stenosed.  There is severe left ventricular systolic dysfunction.  LV end diastolic pressure is moderately elevated.   Severe two-vessel coronary artery disease with separate ostium to the LAD and circumflex vessel with ostial 70% diffuse LAD stenosis followed by 70%and 80% proximal to mid stenoses; 50 and 70% proximal circumflex stenosis with 90% stenosis in a large OM1 vessel and 70% distal stenosis in the PLA branch of the dominant circumflex vessel, and small nondominant RCA.  Severe LV dysfunction with significant hypo-to akinesis involving the mid distal anterolateral wall, apex and mid distal inferior wall.  Ejection fraction is 25%.  Telemetry    NSR  ECG    NSR, rate 62, QT prolonged,  evolving anterolateral T wave changes with Twaves inverting  Radiology    Dg Chest 2 View  Result Date: 01/31/2016 CLINICAL DATA:  Chest tightness and shortness breath with exertion for the past 5 days. Ex-smoker. EXAM: CHEST  2 VIEW COMPARISON:  01/31/2016. FINDINGS: Normal sized heart. Clear lungs with normal vascularity. Minimal central peribronchial thickening. Mild thoracic spine degenerative changes. IMPRESSION: Interval minimal bronchitic changes. Electronically Signed   By: Beckie SaltsSteven  Reid M.D.   On: 01/31/2016 19:33    Assessment & Plan    NSTEMI:    See below.   CARDIOMYOPATHY:    I reviewed the films with multiple of my cardiology partners.  This LV along with the clinical story and the small rise in troponin all looks like a Takotsubo cardiomyopathy.  I had a long discussion with the patient and her family yesterday and spoke as well with Dr. Donata ClayVan Trigt and Dr. Tresa EndoKelly.  She will likely need bypass in the future for her severe disease.  However, I would like to see if her LV recovers presuming this is Takotsubo.  Continue medical management.  I added lisinopril yesterday.  BP will not allow med titration.  I will stop IV Heparin.   Ambulate in hallway.   Signed, Rollene RotundaJames Inocencio Roy, MD  02/03/2016, 8:02 AM

## 2016-02-03 NOTE — Progress Notes (Signed)
ANTICOAGULATION CONSULT NOTE - Follow Up Consult  Pharmacy Consult for Heparin  Indication: chest pain/ACS, s/p cath  Allergies  Allergen Reactions  . Aleve [Naproxen Sodium] Itching and Other (See Comments)    Reaction:  Redness on palms of hands/bottom of feet   . Codeine Nausea Only    Patient Measurements: Height: 5\' 1"  (154.9 cm) Weight: 112 lb 10.5 oz (51.1 kg) IBW/kg (Calculated) : 47.8  Vital Signs: Temp: 98 F (36.7 C) (08/31 0000) Temp Source: Oral (08/31 0000) BP: 108/65 (08/31 0000) Pulse Rate: 63 (08/31 0400)  Labs:  Recent Labs  01/31/16 1935 01/31/16 2352 02/01/16 0508 02/01/16 1110 02/02/16 0232 02/02/16 1055 02/02/16 2050 02/03/16 0313  HGB 14.2  --  14.0  --  13.7  --   --  15.3*  HCT 41.6  --  41.3  --  41.4  --   --  45.5  PLT 182  --  174  --  187  --   --  199  APTT 27  --   --   --   --   --   --   --   LABPROT 13.0  --  13.8  --   --   --   --   --   INR 0.98  --  1.06  --   --   --   --   --   HEPARINUNFRC  --   --   --   --   --  0.13* 0.27* 0.45  CREATININE 0.90  --  0.85  --  0.84  --   --   --   TROPONINI  --  0.44* 0.38* 0.34*  --   --   --   --     Estimated Creatinine Clearance: 43.7 mL/min (by C-G formula based on SCr of 0.84 mg/dL).   Assessment: 75 y/o F on heparin s/p cath, likely benefit from cath, delaying until LV function improves, HL therapeutic x 1 after rate increae  Goal of Therapy:  Heparin level 0.3-0.7 units/ml Monitor platelets by anticoagulation protocol: Yes   Plan:  -Cont heparin 1000 units/hr -1200 HL  Cadon Raczka 02/03/2016,4:51 AM

## 2016-02-04 ENCOUNTER — Other Ambulatory Visit: Payer: Self-pay

## 2016-02-04 ENCOUNTER — Inpatient Hospital Stay (HOSPITAL_COMMUNITY): Payer: PPO

## 2016-02-04 ENCOUNTER — Encounter (HOSPITAL_COMMUNITY)
Admission: EM | Disposition: A | Discharge status: Home Health | Payer: Self-pay | Source: Home / Self Care | Attending: Cardiovascular Disease

## 2016-02-04 DIAGNOSIS — I429 Cardiomyopathy, unspecified: Secondary | ICD-10-CM | POA: Diagnosis not present

## 2016-02-04 DIAGNOSIS — I214 Non-ST elevation (NSTEMI) myocardial infarction: Secondary | ICD-10-CM | POA: Diagnosis not present

## 2016-02-04 LAB — CBC
HEMATOCRIT: 43.6 % (ref 36.0–46.0)
HEMOGLOBIN: 14.7 g/dL (ref 12.0–15.0)
MCH: 30.1 pg (ref 26.0–34.0)
MCHC: 33.7 g/dL (ref 30.0–36.0)
MCV: 89.3 fL (ref 78.0–100.0)
PLATELETS: 200 10*3/uL (ref 150–400)
RBC: 4.88 MIL/uL (ref 3.87–5.11)
RDW: 12.5 % (ref 11.5–15.5)
WBC: 11.3 10*3/uL — AB (ref 4.0–10.5)

## 2016-02-04 LAB — HEMOGLOBIN A1C
Hgb A1c MFr Bld: 5.8 % — ABNORMAL HIGH (ref 4.8–5.6)
Mean Plasma Glucose: 120 mg/dL

## 2016-02-04 LAB — ECHOCARDIOGRAM LIMITED
Height: 61 in
Weight: 1802.48 oz

## 2016-02-04 SURGERY — CORONARY ARTERY BYPASS GRAFTING (CABG)
Anesthesia: General | Site: Chest

## 2016-02-04 NOTE — Progress Notes (Signed)
Pt stable for tranfers to med/surg and to be transferred to 2 WEst Bed 28. Full report given to 2West RN.

## 2016-02-04 NOTE — Progress Notes (Signed)
CARDIAC REHAB PHASE I   PRE:  Rate/Rhythm: 64 SR  BP:  Supine:   Sitting: 120/62  Standing:    SaO2: 94%RA  MODE:  Ambulation: 550 ft   POST:  Rate/Rhythm: 66  BP:  Supine:   Sitting: 131/63  Standing:    SaO2: 98%RA 1320-1410 Pt walked 550 ft on RA with steady gait. Discussed MI restrictions, low sodium diet, takot subo article given, NTG use, and encouraged activity as directed by cardiologist. Briefly discussed CRP 2 for after surgery. Gave OHS booklet and care guide and wrote down how to view pre op video. Discussed sternal precautions and how important it will be to walk after surgery. Demonstrated getting up and down without use of arms. Will have someone 24/7 for first week after discharge from surgery.   Luetta Nuttingharlene Mandy Peeks, RN BSN  02/04/2016 2:08 PM

## 2016-02-04 NOTE — Consult Note (Signed)
   [facility] Inpatient Consult   02/04/2016  NELEH MULDOON 10/06/1940 [id number]  Patient screened for potential [place] Management services. Patient is eligible for [facility] Care Management services under patient's [employer] Medicare  plan. Chart review reveals the Pt is a 75yo female with PMH significant only for OA and GERD who presented with a chest pain and NSTEMI.  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 Met with patient and family at bedside to notify her of benefits with [facility] Care Management.  Patient states she would like a nurse to follow up because she will like a check with her on new medications.  Patient was scheduled for a CABG but will have this rescheduled for later per patient. Consent form signed and package with information given.     Please call for questions contact:   Natividad Brood, RN BSN [facility] Liaison  [phone] business mobile phone Toll free office [phone]

## 2016-02-04 NOTE — Progress Notes (Signed)
Patient Name: Carolyn Burns Date of Encounter: 02/04/2016  Hospital Problem List     Active Problems:   NSTEMI (non-ST elevated myocardial infarction) [facility](HCC)    Patient Profile   Pt is a 75yo female with PMH significant only for OA and GERD who presented with a chest pain and NSTEMI.  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    Subjective   Cath with results as below.  She did well yesterday and ambulated without chest pain.  No SOB.    Inpatient Medications    . aspirin EC  81 mg Oral Daily  . atorvastatin  40 mg Oral q1800  . cholecalciferol  2,000 Units Oral BID  . estradiol  0.5 mg Oral Daily  . famotidine  10 mg Oral Daily  . furosemide  20 mg Oral Daily  . lisinopril  5 mg Oral Daily  . metoprolol tartrate  12.5 mg Oral BID  . progesterone  100 mg Oral Daily  . sodium chloride flush  3 mL Intravenous Q12H    Vital Signs    Vitals:   02/03/16 2010 02/03/16 2344 02/04/16 0325 02/04/16 0806  BP: 104/60 107/65 (!) 114/59 120/71  Pulse: 71 65 62 66  Resp: 18 (!) 21 20 18   Temp:  98.2 F (36.8 C) 97.7 F (36.5 C) 98 F (36.7 C)  TempSrc:  Oral Oral Oral  SpO2: 97% 94% 99% 96%  Weight:      Height:        Intake/Output Summary (Last 24 hours) at 02/04/16 0917 Last data filed at 02/03/16 2015  Gross per 24 hour  Intake              360 ml  Output              500 ml  Net             -140 ml   Filed Weights   01/31/16 1913 02/01/16 1925  Weight: 116 lb (52.6 kg) 112 lb 10.5 oz (51.1 kg)    Physical Exam    GEN: Well nourished, well developed, in no no acute distress.  Neck: Supple, no JVD, carotid bruits, or masses. Cardiac: RRR, no murmurs rubs, or gallops. No clubbing, cyanosis, no edema.  Radials/DP/PT 2+ and equal bilaterally, cath access site OK.  Respiratory:  Respirations  regular and unlabored, clear to auscultation bilaterally. GI: Soft, nontender, nondistended, BS + x 4. Neuro:  Strength  and sensation are intact.   Labs    CBC  Recent Labs  02/03/16 0313 02/04/16 0336  WBC 10.1 11.3*  HGB 15.3* 14.7  HCT 45.5 43.6  MCV 89.6 89.3  PLT 199 200   Basic Metabolic Panel  Recent Labs  02/02/16 0232 02/03/16 0313  NA 139 138  K 3.8 4.0  CL 107 104  CO2 23 26  GLUCOSE 104* 113*  BUN 10 16  CREATININE 0.84 0.98  CALCIUM 9.2 9.5   Liver Function Tests  Recent Labs  02/03/16 0313  AST 20  ALT 15  ALKPHOS 39  BILITOT 0.8  PROT 6.4*  ALBUMIN 3.8   No results for input(s): LIPASE, AMYLASE in the last 72 hours. Cardiac Enzymes  Recent Labs  02/01/16 1110  TROPONINI 0.34*   BNP Invalid input(s): POCBNP D-Dimer No results for input(s): DDIMER in the last 72 hours. Hemoglobin A1C  Recent Labs  02/03/16 0313  HGBA1C 5.8*   Fasting Lipid  Panel  Recent Labs  02/03/16 0313  CHOL 190  HDL 36*  LDLCALC 127*  TRIG 137  CHOLHDL 5.3   Thyroid Function Tests  Recent Labs  02/03/16 0313  TSH 2.772     CATH  LM lesion, 75 %stenosed.  Ost LAD lesion, 70 %stenosed.  Prox LAD to Mid LAD lesion, 80 %stenosed.  LPDA lesion, 70 %stenosed.  Ost Cx to Prox Cx lesion, 50 %stenosed.  Prox Cx lesion, 70 %stenosed.  1st Mrg lesion, 90 %stenosed.  There is severe left ventricular systolic dysfunction.  LV end diastolic pressure is moderately elevated.   Severe two-vessel coronary artery disease with separate ostium to the LAD and circumflex vessel with ostial 70% diffuse LAD stenosis followed by 70%and 80% proximal to mid stenoses; 50 and 70% proximal circumflex stenosis with 90% stenosis in a large OM1 vessel and 70% distal stenosis in the PLA branch of the dominant circumflex vessel, and small nondominant RCA.  Severe LV dysfunction with significant hypo-to akinesis involving the mid distal anterolateral wall, apex and mid distal inferior wall.  Ejection fraction is 25%.  Telemetry    NSR  ECG    NSR, rate 62, QT prolonged,  evolving anterolateral T wave changes with Twaves inverting  Radiology    Dg Chest 2 View  Result Date: 01/31/2016 CLINICAL DATA:  Chest tightness and shortness breath with exertion for the past 5 days. Ex-smoker. EXAM: CHEST  2 VIEW COMPARISON:  01/31/2016. FINDINGS: Normal sized heart. Clear lungs with normal vascularity. Minimal central peribronchial thickening. Mild thoracic spine degenerative changes. IMPRESSION: Interval minimal bronchitic changes. Electronically Signed   By: Beckie Salts M.D.   On: 01/31/2016 19:33    Assessment & Plan    NSTEMI:    See below.   CARDIOMYOPATHY:    CAD with also a Takotsubo cardiomyopathy.  No further pain.  I will transfer to a floor bed.  She will ambulate again today.  I will repeat an echocardiogram.  If she is pain free I think that she could go home tomorrow but have early follow up with an APP at [facility] next week on a day I am in clinic.   Started low dose ACE inhibitor yesterday.  Continue current meds.     DYSLIPIDEMIA:  LDL is 127.  Started on statin this admission.    Signed, Rollene Rotunda, MD  02/04/2016, 9:17 AM

## 2016-02-04 NOTE — Progress Notes (Signed)
Pt arrived to unit from [facility].  Telemetry applied and CCMD notified.  Pt oriented to room including call light and telephone.  Pt current plan of care, overnight observation with possible DC in AM.  Pt indicates understanding.  Will cont to monitor.

## 2016-02-04 NOTE — Care Management Important Message (Signed)
Important Message  Patient Details  Name: Carolyn Burns MRN: [id number] Date of Birth: 08/12/1940   Medicare Important Message Given:  Yes    Kyla BalzarineShealy, Anuel Sitter Abena 02/04/2016, 9:40 AM

## 2016-02-04 NOTE — Progress Notes (Signed)
*  PRELIMINARY RESULTS* Echocardiogram 2D Echocardiogram has been performed.  Jeryl Columbialliott, Olean Sangster 02/04/2016, 11:56 AM

## 2016-02-05 DIAGNOSIS — E785 Hyperlipidemia, unspecified: Secondary | ICD-10-CM | POA: Diagnosis not present

## 2016-02-05 DIAGNOSIS — I420 Dilated cardiomyopathy: Secondary | ICD-10-CM | POA: Diagnosis not present

## 2016-02-05 DIAGNOSIS — I25700 Atherosclerosis of coronary artery bypass graft(s), unspecified, with unstable angina pectoris: Secondary | ICD-10-CM | POA: Diagnosis not present

## 2016-02-05 DIAGNOSIS — I10 Essential (primary) hypertension: Secondary | ICD-10-CM | POA: Diagnosis not present

## 2016-02-05 DIAGNOSIS — I214 Non-ST elevation (NSTEMI) myocardial infarction: Secondary | ICD-10-CM | POA: Diagnosis not present

## 2016-02-05 LAB — BASIC METABOLIC PANEL
ANION GAP: 8 (ref 5–15)
BUN: 21 mg/dL — ABNORMAL HIGH (ref 6–20)
CHLORIDE: 104 mmol/L (ref 101–111)
CO2: 25 mmol/L (ref 22–32)
CREATININE: 1 mg/dL (ref 0.44–1.00)
Calcium: 9.7 mg/dL (ref 8.9–10.3)
GFR calc non Af Amer: 54 mL/min — ABNORMAL LOW (ref >60)
GLUCOSE: 112 mg/dL — AB (ref 65–99)
Potassium: 4.1 mmol/L (ref 3.5–5.1)
Sodium: 137 mmol/L (ref 135–145)

## 2016-02-05 LAB — CBC
HEMATOCRIT: 43.6 % (ref 36.0–46.0)
HEMOGLOBIN: 14.5 g/dL (ref 12.0–15.0)
MCH: 29.9 pg (ref 26.0–34.0)
MCHC: 33.3 g/dL (ref 30.0–36.0)
MCV: 89.9 fL (ref 78.0–100.0)
Platelets: 200 10*3/uL (ref 150–400)
RBC: 4.85 MIL/uL (ref 3.87–5.11)
RDW: 12.3 % (ref 11.5–15.5)
WBC: 10.7 10*3/uL — ABNORMAL HIGH (ref 4.0–10.5)

## 2016-02-05 MED ORDER — HEPARIN BOLUS VIA INFUSION
2000.0000 [IU] | Freq: Once | INTRAVENOUS | Status: AC
  Administered 2016-02-05: 2000 [IU] via INTRAVENOUS
  Filled 2016-02-05: qty 2000

## 2016-02-05 MED ORDER — HEPARIN (PORCINE) IN NACL 100-0.45 UNIT/ML-% IJ SOLN
700.0000 [IU]/h | INTRAMUSCULAR | Status: DC
  Administered 2016-02-05: 1000 [IU]/h via INTRAVENOUS
  Administered 2016-02-06: 850 [IU]/h via INTRAVENOUS
  Administered 2016-02-07: 600 [IU]/h via INTRAVENOUS
  Filled 2016-02-05 (×4): qty 250

## 2016-02-05 NOTE — Progress Notes (Signed)
ANTICOAGULATION CONSULT NOTE - Follow Up Consult  Pharmacy Consult for Heparin  Indication: chest pain/ACS  Assessment: 75 y/o F on s/p cath. Question of takotsubo cardiomyopathy, but larger concern of significant multivessel CAD with possible need for coronary bypass next week. Cardiology has consulted with CVTS and they will evaluate. In the mean time will start IV heparin and continue to follow.   Patient has been off anticoagulation since 8/31, cbc remains stable.  Goal of Therapy:  Heparin level 0.3-0.7 units/ml Monitor platelets by anticoagulation protocol: Yes   Plan:  -Restart heparin at 1000 units/hr with 2000 unit bolus. -Heparin level in 8 hours then daily  Allergies  Allergen Reactions  . Aleve [Naproxen Sodium] Itching and Other (See Comments)    Reaction:  Redness on palms of hands/bottom of feet   . Codeine Nausea Only    Patient Measurements: Height: 5\' 1"  (154.9 cm) Weight: 112 lb 10.5 oz (51.1 kg) IBW/kg (Calculated) : 47.8  Vital Signs: Temp: 98.5 F (36.9 C) (09/02 0536) Temp Source: Oral (09/02 0536) BP: 108/63 (09/02 0536) Pulse Rate: 71 (09/02 0536)  Labs:  Recent Labs  02/02/16 2050  02/03/16 0313 02/04/16 0336 02/05/16 0210  HGB  --   < > 15.3* 14.7 14.5  HCT  --   --  45.5 43.6 43.6  PLT  --   --  199 200 200  HEPARINUNFRC 0.27*  --  0.45  --   --   CREATININE  --   --  0.98  --  1.00  < > = values in this interval not displayed.  Estimated Creatinine Clearance: 36.7 mL/min (by C-G formula based on SCr of 1 mg/dL).    Sheppard CoilFrank Ricky Doan PharmD., BCPS Clinical Pharmacist Pager [phone] 02/05/2016 3:01 PM

## 2016-02-05 NOTE — Progress Notes (Signed)
CARDIAC REHAB PHASE I   PRE:  Rate/Rhythm: 77 SR  BP:  Supine:   Sitting: 121/71  Standing:    SaO2: 97%RA  MODE:  Ambulation: 550 ft   POST:  Rate/Rhythm: 79  BP:  Supine:   Sitting: 128/68  Standing:    SaO2: 97%RA 0930-0948 Pt walked 550 ft on RA with steady gait. No CP.   Luetta Nuttingharlene Tyrez Berrios, RN BSN  02/05/2016 9:45 AM

## 2016-02-05 NOTE — Progress Notes (Signed)
SUBJECTIVE:  Denies chest pain or SOB  OBJECTIVE:   Vitals:   Vitals:   02/04/16 1130 02/04/16 1241 02/04/16 1924 02/05/16 0536  BP: 132/61 113/64 114/67 108/63  Pulse: 69 61 69 71  Resp: 19 19 20 16   Temp: 97.6 F (36.4 C) 98.1 F (36.7 C) 97.9 F (36.6 C) 98.5 F (36.9 C)  TempSrc: Oral Oral Oral Oral  SpO2: 97% 97% 97% 96%  Weight:      Height:       I&O's:  No intake or output data in the 24 hours ending 02/05/16 1134 TELEMETRY: Reviewed telemetry pt in NSR:     PHYSICAL EXAM General: Well developed, well nourished, in no acute distress Head: Eyes PERRLA, No xanthomas.   Normal cephalic and atramatic  Lungs:   Clear bilaterally to auscultation and percussion. Heart:   HRRR S1 S2 Pulses are 2+ & equal. Abdomen: Bowel sounds are positive, abdomen soft and non-tender without masses  Extremities:   No clubbing, cyanosis or edema.  DP +1 Neuro: Alert and oriented X 3. Psych:  Good affect, responds appropriately   LABS: Basic Metabolic Panel:  Recent Labs  96/09/5406/31/17 0313 02/05/16 0210  NA 138 137  K 4.0 4.1  CL 104 104  CO2 26 25  GLUCOSE 113* 112*  BUN 16 21*  CREATININE 0.98 1.00  CALCIUM 9.5 9.7   Liver Function Tests:  Recent Labs  02/03/16 0313  AST 20  ALT 15  ALKPHOS 39  BILITOT 0.8  PROT 6.4*  ALBUMIN 3.8   No results for input(s): LIPASE, AMYLASE in the last 72 hours. CBC:  Recent Labs  02/04/16 0336 02/05/16 0210  WBC 11.3* 10.7*  HGB 14.7 14.5  HCT 43.6 43.6  MCV 89.3 89.9  PLT 200 200   Cardiac Enzymes: No results for input(s): CKTOTAL, CKMB, CKMBINDEX, TROPONINI in the last 72 hours. BNP: Invalid input(s): POCBNP D-Dimer: No results for input(s): DDIMER in the last 72 hours. Hemoglobin A1C:  Recent Labs  02/03/16 0313  HGBA1C 5.8*   Fasting Lipid Panel:  Recent Labs  02/03/16 0313  CHOL 190  HDL 36*  LDLCALC 127*  TRIG 137  CHOLHDL 5.3   Thyroid Function Tests:  Recent Labs  02/03/16 0313  TSH  2.772   Anemia Panel: No results for input(s): VITAMINB12, FOLATE, FERRITIN, TIBC, IRON, RETICCTPCT in the last 72 hours. Coag Panel:   Lab Results  Component Value Date   INR 1.06 02/01/2016   INR 0.98 01/31/2016    RADIOLOGY: Dg Chest 2 View  Result Date: 01/31/2016 CLINICAL DATA:  Chest tightness and shortness breath with exertion for the past 5 days. Ex-smoker. EXAM: CHEST  2 VIEW COMPARISON:  01/31/2016. FINDINGS: Normal sized heart. Clear lungs with normal vascularity. Minimal central peribronchial thickening. Mild thoracic spine degenerative changes. IMPRESSION: Interval minimal bronchitic changes. Electronically Signed   By: Beckie SaltsSteven  Reid M.D.   On: 01/31/2016 19:33    ASSESSMENT/PLAN: NSTEMI:    See below. Trop with flat trend and minimally elevated.  CARDIOMYOPATHY:   Severe 3 vessel CAD with also a Takotsubo cardiomyopathy.  No further pain. Given appearance of the LV there is consensus of cardiology that she could have a nonischemic cardiomyopathy-takasubo and she would benefit from delaying surgery until LV function significantly improves.  Repeat 2D echo showed EF 40-45% which has improved from admission.  Continue medical therapy for now. She is completely asymptomatic and wants to go home. I will check with CVTS given that  her Ef has improved already and she has a 75% LM with significant 3v CAD. Continue ACE I/BB/ASA/statin.  DYSLIPIDEMIA:  LDL is 127.  Started on statin this admission.    Armanda Magic, MD  02/05/2016  11:34 AM

## 2016-02-06 DIAGNOSIS — I214 Non-ST elevation (NSTEMI) myocardial infarction: Secondary | ICD-10-CM | POA: Diagnosis not present

## 2016-02-06 DIAGNOSIS — I420 Dilated cardiomyopathy: Secondary | ICD-10-CM

## 2016-02-06 DIAGNOSIS — I10 Essential (primary) hypertension: Secondary | ICD-10-CM | POA: Diagnosis not present

## 2016-02-06 DIAGNOSIS — E785 Hyperlipidemia, unspecified: Secondary | ICD-10-CM | POA: Diagnosis not present

## 2016-02-06 DIAGNOSIS — I25700 Atherosclerosis of coronary artery bypass graft(s), unspecified, with unstable angina pectoris: Secondary | ICD-10-CM | POA: Diagnosis not present

## 2016-02-06 DIAGNOSIS — I25110 Atherosclerotic heart disease of native coronary artery with unstable angina pectoris: Secondary | ICD-10-CM

## 2016-02-06 LAB — CBC
HEMATOCRIT: 42.1 % (ref 36.0–46.0)
Hemoglobin: 14.3 g/dL (ref 12.0–15.0)
MCH: 30 pg (ref 26.0–34.0)
MCHC: 34 g/dL (ref 30.0–36.0)
MCV: 88.3 fL (ref 78.0–100.0)
Platelets: 194 10*3/uL (ref 150–400)
RBC: 4.77 MIL/uL (ref 3.87–5.11)
RDW: 12.4 % (ref 11.5–15.5)
WBC: 11.3 10*3/uL — AB (ref 4.0–10.5)

## 2016-02-06 LAB — HEPARIN LEVEL (UNFRACTIONATED)
Heparin Unfractionated: 0.6 IU/mL (ref 0.30–0.70)
Heparin Unfractionated: 0.82 IU/mL — ABNORMAL HIGH (ref 0.30–0.70)
Heparin Unfractionated: 0.9 IU/mL — ABNORMAL HIGH (ref 0.30–0.70)

## 2016-02-06 MED ORDER — ~~LOC~~ CARDIAC SURGERY, PATIENT & FAMILY EDUCATION
Freq: Once | Status: AC
  Administered 2016-02-06: 09:00:00
  Filled 2016-02-06: qty 1

## 2016-02-06 NOTE — Progress Notes (Signed)
ANTICOAGULATION CONSULT NOTE - Follow Up Consult  Pharmacy Consult for Heparin  Indication: chest pain/ACS, s/p cath, awaiting possible CABG  Allergies  Allergen Reactions  . Aleve [Naproxen Sodium] Itching and Other (See Comments)    Reaction:  Redness on palms of hands/bottom of feet   . Codeine Nausea Only    Patient Measurements: Height: 5\' 1"  (154.9 cm) Weight: 112 lb 10.5 oz (51.1 kg) IBW/kg (Calculated) : 47.8  Vital Signs: Temp: 97.8 F (36.6 C) (09/03 0543) Temp Source: Oral (09/03 0543) BP: 121/61 (09/03 0543) Pulse Rate: 63 (09/03 0543)  Labs:  Recent Labs  02/04/16 0336 02/05/16 0210 02/05/16 2350 02/06/16 0706 02/06/16 1616  HGB 14.7 14.5 14.3  --   --   HCT 43.6 43.6 42.1  --   --   PLT 200 200 194  --   --   HEPARINUNFRC  --   --  0.60 0.90* 0.82*  CREATININE  --  1.00  --   --   --     Estimated Creatinine Clearance: 36.7 mL/min (by C-G formula based on SCr of 1 mg/dL).  Assessment: [age] YOF with ACS awaiting CABG. [facility]. Was continued on Lovenx, but changed to heparin post-cath for CABG preparation.  Initial heparin level elevated and rate was decreased this morning. Level is still above goal range at 0.82 units/mL. No bleeding noted.  Goal of Therapy:  Heparin level 0.3-0.7 units/ml Monitor platelets by anticoagulation protocol: Yes   Plan Decrease Heparin to 750 units/hr Daily heparin level and CBC F/u plans for CABG  Bree Heinzelman D. Avrie Kedzierski, PharmD, BCPS Clinical Pharmacist Pager: [phone] 02/06/2016 5:22 PM

## 2016-02-06 NOTE — Progress Notes (Addendum)
ANTICOAGULATION CONSULT NOTE - Follow Up Consult  Pharmacy Consult for Heparin  Indication: chest pain/ACS, s/p cath, awaiting possible CABG  Allergies  Allergen Reactions  . Aleve [Naproxen Sodium] Itching and Other (See Comments)    Reaction:  Redness on palms of hands/bottom of feet   . Codeine Nausea Only    Patient Measurements: Height: 5\' 1"  (154.9 cm) Weight: 112 lb 10.5 oz (51.1 kg) IBW/kg (Calculated) : 47.8  Vital Signs: Temp: 98.4 F (36.9 C) (09/02 1930) Temp Source: Oral (09/02 1930) BP: 130/59 (09/02 2215) Pulse Rate: 72 (09/02 2215)  Labs:  Recent Labs  02/03/16 0313 02/04/16 0336 02/05/16 0210 02/05/16 2350  HGB 15.3* 14.7 14.5 14.3  HCT 45.5 43.6 43.6 42.1  PLT 199 200 200 194  HEPARINUNFRC 0.45  --   --  0.60  CREATININE 0.98  --  1.00  --     Estimated Creatinine Clearance: 36.7 mL/min (by C-G formula based on SCr of 1 mg/dL).  Assessment: Therapeutic heparin level x 1 after rate decrease  Goal of Therapy:  Heparin level 0.3-0.7 units/ml Monitor platelets by anticoagulation protocol: Yes   Plan:  -Cont heparin 750 units/hr -1200 HL -F/U CABG this week  Abran DukeLedford, Cynda Soule 02/06/2016,12:49 AM

## 2016-02-06 NOTE — Progress Notes (Addendum)
SUBJECTIVE:  Denies chest pain  OBJECTIVE:   Vitals:   Vitals:   02/05/16 1615 02/05/16 1930 02/05/16 2215 02/06/16 0543  BP: 116/71 128/60 (!) 130/59 121/61  Pulse: 69 67 72 63  Resp: 17 18  16   Temp: 97.9 F (36.6 C) 98.4 F (36.9 C)  97.8 F (36.6 C)  TempSrc: Oral Oral  Oral  SpO2: 93% 100%  98%  Weight:      Height:       I&O's:   Intake/Output Summary (Last 24 hours) at 02/06/16 0857 Last data filed at 02/06/16 0300  Gross per 24 hour  Intake              830 ml  Output                0 ml  Net              830 ml   TELEMETRY: Reviewed telemetry pt in NSR:     PHYSICAL EXAM General: Well developed, well nourished, in no acute distress Head: Eyes PERRLA, No xanthomas.   Normal cephalic and atramatic  Lungs:   Clear bilaterally to auscultation and percussion. Heart:   HRRR S1 S2 Pulses are 2+ & equal. Abdomen: Bowel sounds are positive, abdomen soft and non-tender without masses Msk:  Back normal, normal gait. Normal strength and tone for age. Extremities:   No clubbing, cyanosis or edema.  DP +1 Neuro: Alert and oriented X 3. Psych:  Good affect, responds appropriately   LABS: Basic Metabolic Panel:  Recent Labs  16/03/9608/02/17 0210  NA 137  K 4.1  CL 104  CO2 25  GLUCOSE 112*  BUN 21*  CREATININE 1.00  CALCIUM 9.7   Liver Function Tests: No results for input(s): AST, ALT, ALKPHOS, BILITOT, PROT, ALBUMIN in the last 72 hours. No results for input(s): LIPASE, AMYLASE in the last 72 hours. CBC:  Recent Labs  02/05/16 0210 02/05/16 2350  WBC 10.7* 11.3*  HGB 14.5 14.3  HCT 43.6 42.1  MCV 89.9 88.3  PLT 200 194   Cardiac Enzymes: No results for input(s): CKTOTAL, CKMB, CKMBINDEX, TROPONINI in the last 72 hours. BNP: Invalid input(s): POCBNP D-Dimer: No results for input(s): DDIMER in the last 72 hours. Hemoglobin A1C: No results for input(s): HGBA1C in the last 72 hours. Fasting Lipid Panel: No results for input(s): CHOL, HDL, LDLCALC,  TRIG, CHOLHDL, LDLDIRECT in the last 72 hours. Thyroid Function Tests: No results for input(s): TSH, T4TOTAL, T3FREE, THYROIDAB in the last 72 hours.  Invalid input(s): FREET3 Anemia Panel: No results for input(s): VITAMINB12, FOLATE, FERRITIN, TIBC, IRON, RETICCTPCT in the last 72 hours. Coag Panel:   Lab Results  Component Value Date   INR 1.06 02/01/2016   INR 0.98 01/31/2016    RADIOLOGY: Dg Chest 2 View  Result Date: 01/31/2016 CLINICAL DATA:  Chest tightness and shortness breath with exertion for the past 5 days. Ex-smoker. EXAM: CHEST  2 VIEW COMPARISON:  01/31/2016. FINDINGS: Normal sized heart. Clear lungs with normal vascularity. Minimal central peribronchial thickening. Mild thoracic spine degenerative changes. IMPRESSION: Interval minimal bronchitic changes. Electronically Signed   By: Beckie SaltsSteven  Reid M.D.   On: 01/31/2016 19:33    ASSESSMENT/PLAN: NSTEMI: See below. Trop with flat trend and minimally elevated.  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 No further pain. Given appearance of the LV there is initial consensus of cardiology was that she could have a nonischemic cardiomyopathy-takasubo and would benefit from  delaying surgery until LV function significantly improves.  Repeat 2D echo showed EF 40-45% which has improved from admission.  I have reviewed the films, initial echo and repeat echo with Dr. Gala Romney.  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  Her RCA is nondominant and very small and her LAD has a high grade ostial stenosis of 75% and sequential 70% and 80% prox to mid and LCx with 70% stenosis at bifurcation of large OM and  tight 90% OM in a large vessel.  Her LVF has improved although she still has an anteroapical wall motion abnormality.  This could be explained by LAD lesion. Since her LVF has improved and she has severe CAD, we will keep her over the weekend and plan CABG later next week.  I have also  discussed this with Dr. Tyrone Sage.  Continue medical therapy for now including IV heparin gtt.  Continue ACE I/BB/ASA/statin. Stop estrogen replacement therapy.    DYSLIPIDEMIA: LDL is 127. Started on statin this admission.    Armanda Magic, MD  02/06/2016  8:57 AM

## 2016-02-06 NOTE — Progress Notes (Signed)
ANTICOAGULATION CONSULT NOTE - Follow Up Consult  Pharmacy Consult for Heparin  Indication: chest pain/ACS, s/p cath, awaiting possible CABG  Allergies  Allergen Reactions  . Aleve [Naproxen Sodium] Itching and Other (See Comments)    Reaction:  Redness on palms of hands/bottom of feet   . Codeine Nausea Only    Patient Measurements: Height: 5\' 1"  (154.9 cm) Weight: 112 lb 10.5 oz (51.1 kg) IBW/kg (Calculated) : 47.8  Vital Signs: Temp: 97.8 F (36.6 C) (09/03 0543) Temp Source: Oral (09/03 0543) BP: 121/61 (09/03 0543) Pulse Rate: 63 (09/03 0543)  Labs:  Recent Labs  02/04/16 0336 02/05/16 0210 02/05/16 2350 02/06/16 0706  HGB 14.7 14.5 14.3  --   HCT 43.6 43.6 42.1  --   PLT 200 200 194  --   HEPARINUNFRC  --   --  0.60 0.90*  CREATININE  --  1.00  --   --     Estimated Creatinine Clearance: 36.7 mL/min (by C-G formula based on SCr of 1 mg/dL).  Assessment:  Anticoagulation: [facility]. To continue Lovenox for NSTEMI. CBC wnl. Last dose of Lovenox was 2200 on 8/28. D/C Enox and change to Hep.  9/2 heparin restarted for r/o acs with possible CABG needed next wk 9/3 HL supratherapeutic at 0.90 on 1000 units/hr. Will decrease by 2.9 units/hr and reasses in 8 hours. No s/sx of bleeding noted. H/H and PLTC stable,  Goal of Therapy:  Heparin level 0.3-0.7 units/ml Monitor platelets by anticoagulation protocol: Yes   Plan Decrease Heparin to 850 units/hr 1700 HL F/up cabg plan Arminger, Jomarie LongsJoseph 02/06/2016,8:41 AM

## 2016-02-06 NOTE — Progress Notes (Signed)
      [address]       [place] [id number]             [phone]                 5 Days Post-Op Procedure(s) (LRB): Left Heart Cath and Coronary Angiography (N/A)  LOS: 6 days   Subjective: No chest pain   Objective: Vital signs in last 24 hours: Patient Vitals for the past 24 hrs:  BP Temp Temp src Pulse Resp SpO2  02/06/16 0543 121/61 97.8 F (36.6 C) Oral 63 16 98 %  02/05/16 2215 (!) 130/59 - - 72 - -  02/05/16 1930 128/60 98.4 F (36.9 C) Oral 67 18 100 %  02/05/16 1615 116/71 97.9 F (36.6 C) Oral 69 17 93 %    Filed Weights   01/31/16 1913 02/01/16 1925  Weight: 116 lb (52.6 kg) 112 lb 10.5 oz (51.1 kg)    Hemodynamic parameters for last 24 hours:    Intake/Output from previous day: 09/02 0701 - 09/03 0700 In: 830 [P.O.:720; I.V.:110] Out: -  Intake/Output this shift: No intake/output data recorded.  Scheduled Meds: . aspirin EC  81 mg Oral Daily  . atorvastatin  40 mg Oral q1800  . cholecalciferol  2,000 Units Oral BID  . famotidine  10 mg Oral Daily  . furosemide  20 mg Oral Daily  . lisinopril  5 mg Oral Daily  . metoprolol tartrate  12.5 mg Oral BID  . sodium chloride flush  3 mL Intravenous Q12H   Continuous Infusions: . sodium chloride 25 mL/hr at 02/02/16 0444  . heparin 850 Units/hr (02/06/16 0841)   PRN Meds:.sodium chloride, acetaminophen, acetaminophen, ALPRAZolam, diazepam, nitroGLYCERIN, ondansetron (ZOFRAN) IV, ondansetron (ZOFRAN) IV, sodium chloride flush, zolpidem  General appearance: alert, cooperative and no distress Neurologic: intact Heart: regular rate and rhythm, S1, S2 normal, no murmur, click, rub or gallop Lungs: clear to auscultation bilaterally Abdomen: soft, non-tender; bowel sounds normal; no masses,  no organomegaly Extremities: extremities normal, atraumatic, no cyanosis or edema and Homans sign is negative, no sign of DVT   Lab Results: CBC: Recent Labs  02/05/16 0210 02/05/16 2350  WBC  10.7* 11.3*  HGB 14.5 14.3  HCT 43.6 42.1  PLT 200 194   BMET:  Recent Labs  02/05/16 0210  NA 137  K 4.1  CL 104  CO2 25  GLUCOSE 112*  BUN 21*  CREATININE 1.00  CALCIUM 9.7    PT/INR: No results for input(s): LABPROT, INR in the last 72 hours.   Radiology No results found.   Assessment/Plan: S/P Procedure(s) (LRB): Left Heart Cath and Coronary Angiography (N/A)  Notified by Cardiology, change in plan patient has not been discharged. Was on OR schedule for last Friday and canceled. Will let Dr Maren BeachVantrigt  Know patient did not go home.    Carolyn OvensEdward B Ivery Nanney MD 02/06/2016 10:43 AM

## 2016-02-07 DIAGNOSIS — I25700 Atherosclerosis of coronary artery bypass graft(s), unspecified, with unstable angina pectoris: Secondary | ICD-10-CM | POA: Diagnosis not present

## 2016-02-07 DIAGNOSIS — E785 Hyperlipidemia, unspecified: Secondary | ICD-10-CM | POA: Diagnosis not present

## 2016-02-07 DIAGNOSIS — I420 Dilated cardiomyopathy: Secondary | ICD-10-CM | POA: Diagnosis not present

## 2016-02-07 DIAGNOSIS — I10 Essential (primary) hypertension: Secondary | ICD-10-CM | POA: Diagnosis not present

## 2016-02-07 DIAGNOSIS — I214 Non-ST elevation (NSTEMI) myocardial infarction: Secondary | ICD-10-CM | POA: Diagnosis not present

## 2016-02-07 LAB — CBC
HEMATOCRIT: 42.8 % (ref 36.0–46.0)
Hemoglobin: 14.4 g/dL (ref 12.0–15.0)
MCH: 30.1 pg (ref 26.0–34.0)
MCHC: 33.6 g/dL (ref 30.0–36.0)
MCV: 89.4 fL (ref 78.0–100.0)
Platelets: 202 10*3/uL (ref 150–400)
RBC: 4.79 MIL/uL (ref 3.87–5.11)
RDW: 12.4 % (ref 11.5–15.5)
WBC: 12.5 10*3/uL — AB (ref 4.0–10.5)

## 2016-02-07 LAB — HEPARIN LEVEL (UNFRACTIONATED)
HEPARIN UNFRACTIONATED: 0.46 [IU]/mL (ref 0.30–0.70)
Heparin Unfractionated: 0.54 IU/mL (ref 0.30–0.70)
Heparin Unfractionated: 0.91 IU/mL — ABNORMAL HIGH (ref 0.30–0.70)

## 2016-02-07 MED ORDER — LOPERAMIDE HCL 2 MG PO CAPS
2.0000 mg | ORAL_CAPSULE | ORAL | Status: DC | PRN
  Administered 2016-02-07: 2 mg via ORAL
  Filled 2016-02-07: qty 1

## 2016-02-07 NOTE — Progress Notes (Signed)
ANTICOAGULATION CONSULT NOTE - Follow Up Consult  Pharmacy Consult for  Heparin Indication: CAD, awaiting possible CABG  Allergies  Allergen Reactions  . Aleve [Naproxen Sodium] Itching and Other (See Comments)    Reaction:  Redness on palms of hands/bottom of feet   . Codeine Nausea Only    Patient Measurements: Height: 5\' 1"  (154.9 cm) Weight: 112 lb 10.5 oz (51.1 kg) IBW/kg (Calculated) : 47.8 Heparin Dosing Weight: 51.1 kg  Vital Signs: Temp: 98.6 F (37 C) (09/04 1930) Temp Source: Oral (09/04 1930) BP: 113/58 (09/04 1930) Pulse Rate: 67 (09/04 1930)  Labs:  Recent Labs  02/05/16 0210 02/05/16 2350  02/07/16 0137 02/07/16 1225 02/07/16 2100  HGB 14.5 14.3  --  14.4  --   --   HCT 43.6 42.1  --  42.8  --   --   PLT 200 194  --  202  --   --   HEPARINUNFRC  --  0.60  < > 0.54 0.91* 0.46  CREATININE 1.00  --   --   --   --   --   < > = values in this interval not displayed.  Estimated Creatinine Clearance: 36.7 mL/min (by C-G formula based on SCr of 1 mg/dL).  Assessment: Heparin level is therapeutic at 0.46 this evening after most recent rate adjustment to 600 units/hr. No issues with infusion or sxs of bleeding.  Goal of Therapy:  Heparin level 0.3-0.7 units/ml Monitor platelets by anticoagulation protocol: Yes   Plan:  1. Continue heparin infusion at 600 units/hr 2. Heparin level in am to serve as confirmatory    Pollyann SamplesAndy Samari Gorby, PharmD, BCPS 02/07/2016, 9:55 PM Pager: [phone]

## 2016-02-07 NOTE — Progress Notes (Signed)
SUBJECTIVE:  Denies chest pain or SOB  OBJECTIVE:   Vitals:   Vitals:   02/06/16 0543 02/06/16 1912 02/06/16 2110 02/07/16 0518  BP: 121/61 140/65 130/71 (!) 118/59  Pulse: 63  75 69  Resp: 16  20 18   Temp: 97.8 F (36.6 C) 98.1 F (36.7 C) 98.2 F (36.8 C) 98.4 F (36.9 C)  TempSrc: Oral  Oral Oral  SpO2: 98% 96% 100% 97%  Weight:      Height:       I&O's:  No intake or output data in the 24 hours ending 02/07/16 0545 TELEMETRY: Reviewed telemetry pt in NSR:     PHYSICAL EXAM General: Well developed, well nourished, in no acute distress Head: Eyes PERRLA, No xanthomas.   Normal cephalic and atramatic  Lungs:   Clear bilaterally to auscultation and percussion. Heart:   HRRR S1 S2 Pulses are 2+ & equal. Abdomen: Bowel sounds are positive, abdomen soft and non-tender without masses  Msk:  Back normal, normal gait. Normal strength and tone for age. Extremities:   No clubbing, cyanosis or edema.  DP +1 Neuro: Alert and oriented X 3. Psych:  Good affect, responds appropriately   LABS: Basic Metabolic Panel:  Recent Labs  16/10/96 0210  NA 137  K 4.1  CL 104  CO2 25  GLUCOSE 112*  BUN 21*  CREATININE 1.00  CALCIUM 9.7   Liver Function Tests: No results for input(s): AST, ALT, ALKPHOS, BILITOT, PROT, ALBUMIN in the last 72 hours. No results for input(s): LIPASE, AMYLASE in the last 72 hours. CBC:  Recent Labs  02/05/16 2350 02/07/16 0137  WBC 11.3* 12.5*  HGB 14.3 14.4  HCT 42.1 42.8  MCV 88.3 89.4  PLT 194 202   Cardiac Enzymes: No results for input(s): CKTOTAL, CKMB, CKMBINDEX, TROPONINI in the last 72 hours. BNP: Invalid input(s): POCBNP D-Dimer: No results for input(s): DDIMER in the last 72 hours. Hemoglobin A1C: No results for input(s): HGBA1C in the last 72 hours. Fasting Lipid Panel: No results for input(s): CHOL, HDL, LDLCALC, TRIG, CHOLHDL, LDLDIRECT in the last 72 hours. Thyroid Function Tests: No results for input(s): TSH,  T4TOTAL, T3FREE, THYROIDAB in the last 72 hours.  Invalid input(s): FREET3 Anemia Panel: No results for input(s): VITAMINB12, FOLATE, FERRITIN, TIBC, IRON, RETICCTPCT in the last 72 hours. Coag Panel:   Lab Results  Component Value Date   INR 1.06 02/01/2016   INR 0.98 01/31/2016    RADIOLOGY: Dg Chest 2 View  Result Date: 01/31/2016 CLINICAL DATA:  Chest tightness and shortness breath with exertion for the past 5 days. Ex-smoker. EXAM: CHEST  2 VIEW COMPARISON:  01/31/2016. FINDINGS: Normal sized heart. Clear lungs with normal vascularity. Minimal central peribronchial thickening. Mild thoracic spine degenerative changes. IMPRESSION: Interval minimal bronchitic changes. Electronically Signed   By: Beckie Salts M.D.   On: 01/31/2016 19:33    ASSESSMENT/PLAN: NSTEMI: See below. Trop with flat trend and minimally elevated.  CARDIOMYOPATHY: Severe 3 vessel CAD with also ? Takotsubo cardiomyopathy. No further pain. Given appearance of the LV there was initial consensus of cardiology was that she could have a nonischemic cardiomyopathy-takasubo and would benefit from delaying surgery until LV function significantly improves. Repeat 2D echo showed EF 40-45% which has improved from admission.  I have reviewed the films, initial echo and repeat echo with Dr. Gala Romney.  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  Her RCA is nondominant and very small and her LAD has a  high grade ostial stenosis of 75% and sequential 70% and 80% prox to mid and LCx with 70% stenosis at bifurcation of large OM and  tight 90% OM in a large vessel.  Her LVF has improved although she still has an anteroapical wall motion abnormality.  This could be explained by LAD lesion. Since her LVF has improved and she has severe CAD, we will  plan CABG later this week.  I have also discussed this with Dr. Tyrone SageGerhardt. Continue medical therapy for now including IV heparin gtt.  Continue ACE I/BB/ASA/statin. Estrogen  replacement therapy stopped.    DYSLIPIDEMIA: LDL is 127. Started on statin this admission.    Armanda Magicraci Turner, MD  02/07/2016  5:45 AM

## 2016-02-07 NOTE — Progress Notes (Signed)
ANTICOAGULATION CONSULT NOTE - Follow Up Consult  Pharmacy Consult for  Heparin Indication: CAD, awaiting possible CABG  Allergies  Allergen Reactions  . Aleve [Naproxen Sodium] Itching and Other (See Comments)    Reaction:  Redness on palms of hands/bottom of feet   . Codeine Nausea Only    Patient Measurements: Height: 5\' 1"  (154.9 cm) Weight: 112 lb 10.5 oz (51.1 kg) IBW/kg (Calculated) : 47.8 Heparin Dosing Weight: 51.1 kg  Vital Signs: Temp: 97.8 F (36.6 C) (09/04 1255) Temp Source: Oral (09/04 1255) BP: 110/56 (09/04 1255) Pulse Rate: 55 (09/04 1255)  Labs:  Recent Labs  02/05/16 0210 02/05/16 2350  02/06/16 1616 02/07/16 0137 02/07/16 1225  HGB 14.5 14.3  --   --  14.4  --   HCT 43.6 42.1  --   --  42.8  --   PLT 200 194  --   --  202  --   HEPARINUNFRC  --  0.60  < > 0.82* 0.54 0.91*  CREATININE 1.00  --   --   --   --   --   < > = values in this interval not displayed.  Estimated Creatinine Clearance: 36.7 mL/min (by C-G formula based on SCr of 1 mg/dL).  Assessment:   Heparin level was therapeutic (0.54) overnight on 750 units/hr, but has trended up to 0.91 this afternoon, supratherapeutic.  No bleeding reported.  Goal of Therapy:  Heparin level 0.3-0.7 units/ml Monitor platelets by anticoagulation protocol: Yes   Plan:   Decrease heparin drip to 600 units/hr.  Recheck heparin level ~6 hrs after rate change.  Daily heparin level and CBC.  Monitor for any bleeding.  Follow up plans for CABG.  Dennie Fettersgan, Zoeie Ritter [place], [place] Pager: [phone] 02/07/2016,2:46 PM

## 2016-02-08 ENCOUNTER — Encounter (HOSPITAL_COMMUNITY): Payer: Self-pay | Admitting: Cardiothoracic Surgery

## 2016-02-08 DIAGNOSIS — I25110 Atherosclerotic heart disease of native coronary artery with unstable angina pectoris: Secondary | ICD-10-CM | POA: Diagnosis not present

## 2016-02-08 DIAGNOSIS — I214 Non-ST elevation (NSTEMI) myocardial infarction: Secondary | ICD-10-CM | POA: Diagnosis not present

## 2016-02-08 LAB — HEPARIN LEVEL (UNFRACTIONATED): HEPARIN UNFRACTIONATED: 0.48 [IU]/mL (ref 0.30–0.70)

## 2016-02-08 LAB — CBC
HCT: 41.8 % (ref 36.0–46.0)
Hemoglobin: 13.8 g/dL (ref 12.0–15.0)
MCH: 29.6 pg (ref 26.0–34.0)
MCHC: 33 g/dL (ref 30.0–36.0)
MCV: 89.7 fL (ref 78.0–100.0)
PLATELETS: 176 10*3/uL (ref 150–400)
RBC: 4.66 MIL/uL (ref 3.87–5.11)
RDW: 12.4 % (ref 11.5–15.5)
WBC: 10.8 10*3/uL — AB (ref 4.0–10.5)

## 2016-02-08 NOTE — Care Management Important Message (Signed)
Important Message  Patient Details  Name: Carolyn Burns MRN: [id number] Date of Birth: 12-04-1940   Medicare Important Message Given:  Yes    Kyla BalzarineShealy, Romone Shaff Abena 02/08/2016, 4:59 PM

## 2016-02-08 NOTE — Progress Notes (Signed)
7 Days Post-Op Procedure(s) (LRB): Left Heart Cath and Coronary Angiography (N/A) Subjective: Severe three-vessel CAD with mild non-ST elevation MI Most recent echocardiogram shows improved LV function now EF 40-45 % Plan multivessel CABG on Thursday, September 7 Pre-CABG Dopplers acceptable Patient is stable on IV heparin per pharmacy Objective: Vital signs in last 24 hours: Temp:  [97.6 F (36.4 C)-98.6 F (37 C)] 98.2 F (36.8 C) (09/05 1331) Pulse Rate:  [65-80] 65 (09/05 1331) Cardiac Rhythm: Normal sinus rhythm (09/05 0700) Resp:  [18-20] 20 (09/05 1331) BP: (112-130)/(46-59) 115/51 (09/05 1331) SpO2:  [96 %-97 %] 96 % (09/05 1331)  Hemodynamic parameters for last 24 hours:  stable  Intake/Output from previous day: 09/04 0701 - 09/05 0700 In: 840 [P.O.:840] Out: -  Intake/Output this shift: Total I/O In: 480 [P.O.:480] Out: -        Exam    General- alert and comfortable   Lungs- clear without rales, wheezes   Cor- regular rate and rhythm, no murmur , gallop   Abdomen- soft, non-tender   Extremities - warm, non-tender, minimal edema   Neuro- oriented, appropriate, no focal weakness   Lab Results:  Recent Labs  02/07/16 0137 02/08/16 0408  WBC 12.5* 10.8*  HGB 14.4 13.8  HCT 42.8 41.8  PLT 202 176   BMET: No results for input(s): NA, K, CL, CO2, GLUCOSE, BUN, CREATININE, CALCIUM in the last 72 hours.  PT/INR: No results for input(s): LABPROT, INR in the last 72 hours. ABG No results found for: PHART, HCO3, TCO2, ACIDBASEDEF, O2SAT CBG (last 3)  No results for input(s): GLUCAP in the last 72 hours.  Assessment/Plan: S/P Procedure(s) (LRB): Left Heart Cath and Coronary Angiography (N/A) CABG September 7 Procedure, indications benefits and risks discussed with patient   LOS: 8 days    Kathlee Nationseter Van Trigt III 02/08/2016

## 2016-02-08 NOTE — Progress Notes (Signed)
ANTICOAGULATION CONSULT NOTE - Follow Up Consult  Pharmacy Consult for  Heparin Indication: CAD, awaiting CABG  Allergies  Allergen Reactions  . Aleve [Naproxen Sodium] Itching and Other (See Comments)    Reaction:  Redness on palms of hands/bottom of feet   . Codeine Nausea Only    Patient Measurements: Height: 5\' 1"  (154.9 cm) Weight: 112 lb 10.5 oz (51.1 kg) IBW/kg (Calculated) : 47.8 Heparin Dosing Weight: 51.1 kg  Vital Signs: Temp: 97.6 F (36.4 C) (09/05 0952) Temp Source: Oral (09/05 0952) BP: 130/59 (09/05 0952) Pulse Rate: 72 (09/05 0952)  Labs:  Recent Labs  02/05/16 2350  02/07/16 0137 02/07/16 1225 02/07/16 2100 02/08/16 0408  HGB 14.3  --  14.4  --   --  13.8  HCT 42.1  --  42.8  --   --  41.8  PLT 194  --  202  --   --  176  HEPARINUNFRC 0.60  < > 0.54 0.91* 0.46 0.48  < > = values in this interval not displayed.  Estimated Creatinine Clearance: 36.7 mL/min (by C-G formula based on SCr of 1 mg/dL).  Assessment:   Heparin level remains therapeutic (0.48) on 600 units/hr.  No bleeding reported.   CABG now scheduled for 9/7.  Goal of Therapy:  Heparin level 0.3-0.7 units/ml Monitor platelets by anticoagulation protocol: Yes   Plan:   Continue heparin drip at 600 units/hr.  Daily heparin level and CBC.  Monitor for any bleeding.  For CABG on 02/10/16.  Dennie FettersEgan, Venkat Ankney [place], RPh Pager: [phone] 02/08/2016,10:36 AM

## 2016-02-08 NOTE — Progress Notes (Signed)
     SUBJECTIVE: No chest pain or SOB.   Tele: sinus  BP (!) 130/59 (BP Location: Right Arm)   Pulse 72   Temp 97.6 F (36.4 C) (Oral)   Resp 18   Ht 5\' 1"  (1.549 m)   Wt 112 lb 10.5 oz (51.1 kg)   SpO2 97%   BMI 21.29 kg/m   Intake/Output Summary (Last 24 hours) at 02/08/16 1043 Last data filed at 02/08/16 0811  Gross per 24 hour  Intake              840 ml  Output                0 ml  Net              840 ml    PHYSICAL EXAM General: Well developed, well nourished, in no acute distress. Alert and oriented x 3.  Psych:  Good affect, responds appropriately Neck: No JVD. No masses noted.  Lungs: Clear bilaterally with no wheezes or rhonci noted.  Heart: RRR with no murmurs noted. Abdomen: Bowel sounds are present. Soft, non-tender.  Extremities: No lower extremity edema.   LABS:  CBC:  Recent Labs  02/07/16 0137 02/08/16 0408  WBC 12.5* 10.8*  HGB 14.4 13.8  HCT 42.8 41.8  MCV 89.4 89.7  PLT 202 176   Current Meds: . aspirin EC  81 mg Oral Daily  . atorvastatin  40 mg Oral q1800  . cholecalciferol  2,000 Units Oral BID  . famotidine  10 mg Oral Daily  . furosemide  20 mg Oral Daily  . lisinopril  5 mg Oral Daily  . metoprolol tartrate  12.5 mg Oral BID  . sodium chloride flush  3 mL Intravenous Q12H    ASSESSMENT AND PLAN:  1. CAD/NSTEMI: Pt admitted with NSTEMI. Cardiac cath with severe 2 Vessel CAD. She has been seen by CT surgery and planning underway for CABG. 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 Most recent echo 02/04/16 with LVEF=40-45%. She is on ASA, statin, beta blocker and Ace inh. She is on heparin drip. She is having no chest pain. At this time, CABG is planned for Thursday of this week.    Verne Carrowhristopher Kollen Armenti  9/5/201710:43 AM

## 2016-02-08 NOTE — Progress Notes (Signed)
CARDIAC REHAB PHASE I   PRE:  Rate/Rhythm: 79 SR  BP:  Supine: 119/56  Sitting:   Standing:    SaO2: 96%RA  MODE:  Ambulation: 550 ft   POST:  Rate/Rhythm: 76  BP:  Supine:   Sitting: 142/76  Standing:    SaO2: 95%RA 1020-1050 Pt walked 550 ft on RA with minimal asst. Gait steady. Wanted to walk. No CP. Wrote down again how to view pre op video. Pt not sure if she wants to view or not. Gave IS and pt can get about 1250 ml demonstrating it correctly.   Luetta Nuttingharlene Adali Pennings, RN BSN  02/08/2016 10:48 AM

## 2016-02-09 ENCOUNTER — Encounter (HOSPITAL_COMMUNITY): Payer: Self-pay | Admitting: Physician Assistant

## 2016-02-09 DIAGNOSIS — I214 Non-ST elevation (NSTEMI) myocardial infarction: Secondary | ICD-10-CM | POA: Diagnosis not present

## 2016-02-09 LAB — BASIC METABOLIC PANEL
ANION GAP: 13 (ref 5–15)
BUN: 11 mg/dL (ref 6–20)
CALCIUM: 8.7 mg/dL — AB (ref 8.9–10.3)
CO2: 22 mmol/L (ref 22–32)
CREATININE: 0.83 mg/dL (ref 0.44–1.00)
Chloride: 102 mmol/L (ref 101–111)
Glucose, Bld: 111 mg/dL — ABNORMAL HIGH (ref 65–99)
Potassium: 3.7 mmol/L (ref 3.5–5.1)
SODIUM: 137 mmol/L (ref 135–145)

## 2016-02-09 LAB — ABO/RH: ABO/RH(D): A NEG

## 2016-02-09 LAB — CBC
HCT: 39.2 % (ref 36.0–46.0)
Hemoglobin: 13.4 g/dL (ref 12.0–15.0)
MCH: 30.5 pg (ref 26.0–34.0)
MCHC: 34.2 g/dL (ref 30.0–36.0)
MCV: 89.3 fL (ref 78.0–100.0)
PLATELETS: 177 10*3/uL (ref 150–400)
RBC: 4.39 MIL/uL (ref 3.87–5.11)
RDW: 12.1 % (ref 11.5–15.5)
WBC: 11 10*3/uL — AB (ref 4.0–10.5)

## 2016-02-09 LAB — PREPARE RBC (CROSSMATCH)

## 2016-02-09 LAB — HEPARIN LEVEL (UNFRACTIONATED): HEPARIN UNFRACTIONATED: 0.25 [IU]/mL — AB (ref 0.30–0.70)

## 2016-02-09 MED ORDER — SODIUM CHLORIDE 0.9 % IV SOLN
INTRAVENOUS | Status: AC
  Administered 2016-02-10: 1.2 [IU]/h via INTRAVENOUS
  Filled 2016-02-09: qty 2.5

## 2016-02-09 MED ORDER — DEXTROSE 5 % IV SOLN
750.0000 mg | INTRAVENOUS | Status: DC
  Filled 2016-02-09: qty 750

## 2016-02-09 MED ORDER — DEXTROSE 5 % IV SOLN
1.5000 g | INTRAVENOUS | Status: AC
  Administered 2016-02-10: 1.5 g via INTRAVENOUS
  Administered 2016-02-10: .75 g via INTRAVENOUS
  Filled 2016-02-09: qty 1.5

## 2016-02-09 MED ORDER — MAGNESIUM SULFATE 50 % IJ SOLN
40.0000 meq | INTRAMUSCULAR | Status: DC
  Filled 2016-02-09: qty 10

## 2016-02-09 MED ORDER — MUPIROCIN CALCIUM 2 % EX CREA
TOPICAL_CREAM | Freq: Two times a day (BID) | CUTANEOUS | Status: DC
  Filled 2016-02-09: qty 15

## 2016-02-09 MED ORDER — DOPAMINE-DEXTROSE 3.2-5 MG/ML-% IV SOLN
0.0000 ug/kg/min | INTRAVENOUS | Status: AC
  Administered 2016-02-10: 3 ug/kg/min via INTRAVENOUS
  Filled 2016-02-09: qty 250

## 2016-02-09 MED ORDER — POTASSIUM CHLORIDE 2 MEQ/ML IV SOLN
80.0000 meq | INTRAVENOUS | Status: DC
  Filled 2016-02-09: qty 40

## 2016-02-09 MED ORDER — PLASMA-LYTE 148 IV SOLN
INTRAVENOUS | Status: AC
  Administered 2016-02-10: 500 mL
  Filled 2016-02-09: qty 2.5

## 2016-02-09 MED ORDER — CHLORHEXIDINE GLUCONATE 4 % EX LIQD
60.0000 mL | Freq: Once | CUTANEOUS | Status: AC
  Administered 2016-02-10: 4 via TOPICAL
  Filled 2016-02-09: qty 60

## 2016-02-09 MED ORDER — CHLORHEXIDINE GLUCONATE 0.12 % MT SOLN
15.0000 mL | Freq: Once | OROMUCOSAL | Status: AC
  Administered 2016-02-10: 15 mL via OROMUCOSAL
  Filled 2016-02-09: qty 15

## 2016-02-09 MED ORDER — CHLORHEXIDINE GLUCONATE 4 % EX LIQD
60.0000 mL | Freq: Once | CUTANEOUS | Status: AC
  Administered 2016-02-09: 4 via TOPICAL
  Filled 2016-02-09: qty 15

## 2016-02-09 MED ORDER — TEMAZEPAM 15 MG PO CAPS: 15.0000 mg | ORAL_CAPSULE | Freq: Once | ORAL | Status: DC | PRN

## 2016-02-09 MED ORDER — SODIUM CHLORIDE 0.9 % IV SOLN
INTRAVENOUS | Status: DC
  Filled 2016-02-09: qty 30

## 2016-02-09 MED ORDER — BISACODYL 5 MG PO TBEC
5.0000 mg | DELAYED_RELEASE_TABLET | Freq: Once | ORAL | Status: AC
  Administered 2016-02-09: 5 mg via ORAL
  Filled 2016-02-09: qty 1

## 2016-02-09 MED ORDER — PHENYLEPHRINE HCL 10 MG/ML IJ SOLN
30.0000 ug/min | INTRAVENOUS | Status: AC
  Administered 2016-02-10: 100 ug/min via INTRAVENOUS
  Filled 2016-02-09: qty 2

## 2016-02-09 MED ORDER — DEXMEDETOMIDINE HCL IN NACL 400 MCG/100ML IV SOLN
0.1000 ug/kg/h | INTRAVENOUS | Status: AC
  Administered 2016-02-10: .5 ug/kg/h via INTRAVENOUS
  Filled 2016-02-09: qty 100

## 2016-02-09 MED ORDER — METOPROLOL TARTRATE 12.5 MG HALF TABLET
12.5000 mg | ORAL_TABLET | Freq: Once | ORAL | Status: AC
  Administered 2016-02-10: 12.5 mg via ORAL
  Filled 2016-02-09: qty 1

## 2016-02-09 MED ORDER — NITROGLYCERIN IN D5W 200-5 MCG/ML-% IV SOLN
2.0000 ug/min | INTRAVENOUS | Status: AC
  Administered 2016-02-10: 5 ug/min via INTRAVENOUS
  Filled 2016-02-09: qty 250

## 2016-02-09 MED ORDER — SODIUM CHLORIDE 0.9 % IV SOLN
INTRAVENOUS | Status: AC
  Administered 2016-02-10: 70 mL/h via INTRAVENOUS
  Filled 2016-02-09: qty 40

## 2016-02-09 MED ORDER — EPINEPHRINE HCL 1 MG/ML IJ SOLN
0.0000 ug/min | INTRAVENOUS | Status: DC
  Filled 2016-02-09: qty 4

## 2016-02-09 MED ORDER — VANCOMYCIN HCL IN DEXTROSE 1-5 GM/200ML-% IV SOLN
1000.0000 mg | INTRAVENOUS | Status: AC
  Administered 2016-02-10: 1000 mg via INTRAVENOUS
  Filled 2016-02-09 (×2): qty 200

## 2016-02-09 NOTE — Progress Notes (Addendum)
Patient Name: Carolyn Burns Date of Encounter: 02/09/2016     Active Problems:   NSTEMI (non-ST elevated myocardial infarction) (HCC)    SUBJECTIVE  No complaints. Waiting for surgery  CURRENT MEDS . aspirin EC  81 mg Oral Daily  . atorvastatin  40 mg Oral q1800  . cholecalciferol  2,000 Units Oral BID  . famotidine  10 mg Oral Daily  . furosemide  20 mg Oral Daily  . lisinopril  5 mg Oral Daily  . metoprolol tartrate  12.5 mg Oral BID  . sodium chloride flush  3 mL Intravenous Q12H    OBJECTIVE  Vitals:   02/08/16 0952 02/08/16 1331 02/08/16 2043 02/09/16 0538  BP: (!) 130/59 (!) 115/51 131/61 (!) 111/53  Pulse: 72 65 64 69  Resp: 18 20 18 16   Temp: 97.6 F (36.4 C) 98.2 F (36.8 C) 98.5 F (36.9 C) 99.4 F (37.4 C)  TempSrc: Oral Oral Oral Oral  SpO2: 97% 96% 98% 95%  Weight:      Height:        Intake/Output Summary (Last 24 hours) at 02/09/16 0751 Last data filed at 02/08/16 1230  Gross per 24 hour  Intake              480 ml  Output                0 ml  Net              480 ml   Filed Weights   01/31/16 1913 02/01/16 1925  Weight: 116 lb (52.6 kg) 112 lb 10.5 oz (51.1 kg)    PHYSICAL EXAM  General: Pleasant, NAD. Neuro: Alert and oriented X 3. Moves all extremities spontaneously. Psych: Normal affect. HEENT:  Normal  Neck: Supple without bruits or JVD. Lungs:  Resp regular and unlabored, CTA. Heart: RRR no s3, s4, or murmurs. Abdomen: Soft, non-tender, non-distended, BS + x 4.  Extremities: No clubbing, cyanosis or edema. DP/PT/Radials 2+ and equal bilaterally.  Accessory Clinical Findings  CBC  Recent Labs  02/08/16 0408 02/09/16 0422  WBC 10.8* 11.0*  HGB 13.8 13.4  HCT 41.8 39.2  MCV 89.7 89.3  PLT 176 177   Basic Metabolic Panel  Recent Labs  02/09/16 0422  NA 137  K 3.7  CL 102  CO2 22  GLUCOSE 111*  BUN 11  CREATININE 0.83  CALCIUM 8.7*    TELE  NSR   Radiology/Studies  Dg Chest 2 View  Result  Date: 01/31/2016 CLINICAL DATA:  Chest tightness and shortness breath with exertion for the past 5 days. Ex-smoker. EXAM: CHEST  2 VIEW COMPARISON:  01/31/2016. FINDINGS: Normal sized heart. Clear lungs with normal vascularity. Minimal central peribronchial thickening. Mild thoracic spine degenerative changes. IMPRESSION: Interval minimal bronchitic changes. Electronically Signed   By: Beckie Salts M.D.   On: 01/31/2016 19:33   Conclusion: 02/01/16   LM lesion, 75 %stenosed.  Ost LAD lesion, 70 %stenosed.  Prox LAD to Mid LAD lesion, 80 %stenosed.  LPDA lesion, 70 %stenosed.  Ost Cx to Prox Cx lesion, 50 %stenosed.  Prox Cx lesion, 70 %stenosed.  1st Mrg lesion, 90 %stenosed.  There is severe left ventricular systolic dysfunction.  LV end diastolic pressure is moderately elevated.   Severe two-vessel coronary artery disease with separate ostium to the LAD and circumflex vessel with ostial 70% diffuse LAD stenosis followed by 70%and 80% proximal to mid stenoses; 50 and 70% proximal circumflex stenosis with 90%  stenosis in a large OM1 vessel and 70% distal stenosis in the PLA branch of the dominant circumflex vessel, and small nondominant RCA.  Severe LV dysfunction with significant hypo-to akinesis involving the mid distal anterolateral wall, apex and mid distal inferior wall.  Ejection fraction is 25%.  RECOMMENDATION: Surgical consultation for consideration of CABG revascularization surgery.    2D ECHO: 02/02/2016 LV EF: 35% -   40% Study Conclusions - Left ventricle: Systolic function was moderately reduced. The   estimated ejection fraction was in the range of 35% to 40%.   Akinesis of the mid-apicalanteroseptal and apical myocardium.   Doppler parameters are consistent with abnormal left ventricular   relaxation (grade 1 diastolic dysfunction).   2D ECHO: 02/04/2016 LV EF: 40% -   45% Study Conclusions - Left ventricle: Septal and apical hypokinesis The cavity size  was   mildly dilated. Wall thickness was increased in a pattern of mild   LVH. Systolic function was mildly to moderately reduced. The   estimated ejection fraction was in the range of 40% to 45%.   Doppler parameters are consistent with abnormal left ventricular   relaxation (grade 1 diastolic dysfunction). Doppler parameters   are consistent with both elevated ventricular end-diastolic   filling pressure and elevated left atrial filling pressure. - Atrial septum: No defect or patent foramen ovale was identified.   ASSESSMENT AND PLAN   CAD/NSTEMI: Pt admitted with NSTEMI. Cardiac cath with severe 2 Vessel CAD. She has been seen by CT surgery and planning underway for CABG tomorrow 02/10/16. 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 Most recent echo 02/04/16 with LVEF=40-45%. She is on ASA, statin, beta blocker and Ace inh. She is on heparin drip. She is having no chest pain. Continue ASA, BB and statin    Signed, Cline CrockKathryn Thompson PA-C  Pager [phone]   I have personally seen and examined this patient with Cline CrockKathryn Thompson, PA-C. I agree with the assessment and plan as outlined above. She was admitted with a NSTEMI. She was found to have severe 2V CAD. Plans for CABG tomorrow. She is on heparin. No chest pain this am. My exam shows a thin female in NAD, CV:RRR, Lungs: clear bilat, Ext: no edema. Labs reviewed. [facility].   Verne CarrowChristopher Patches Mcdonnell 02/09/2016 8:40 AM

## 2016-02-09 NOTE — Anesthesia Preprocedure Evaluation (Addendum)
Anesthesia Evaluation  Patient identified by MRN, date of birth, ID band Patient awake    Reviewed: Allergy & Precautions, NPO status , Patient's Chart, lab work & pertinent test results  Airway Mallampati: II  TM Distance: >3 FB     Dental  (+) Edentulous Upper, Edentulous Lower, Dental Advisory Given   Pulmonary neg pulmonary ROS,    breath sounds clear to auscultation       Cardiovascular + CAD and + Past MI   Rhythm:Regular Rate:Normal     Neuro/Psych negative neurological ROS  negative psych ROS   GI/Hepatic Neg liver ROS, GERD  Medicated,  Endo/Other  negative endocrine ROS  Renal/GU negative Renal ROS  negative genitourinary   Musculoskeletal  (+) Arthritis , Osteoarthritis,    Abdominal Normal abdominal exam  (+)   Peds negative pediatric ROS (+)  Hematology negative hematology ROS (+)   Anesthesia Other Findings   Reproductive/Obstetrics negative OB ROS                           Lab Results  Component Value Date   WBC 11.0 (H) 02/09/2016   HGB 13.4 02/09/2016   HCT 39.2 02/09/2016   MCV 89.3 02/09/2016   PLT 177 02/09/2016   Lab Results  Component Value Date   CREATININE 0.83 02/09/2016   BUN 11 02/09/2016   NA 137 02/09/2016   K 3.7 02/09/2016   CL 102 02/09/2016   CO2 22 02/09/2016   Lab Results  Component Value Date   INR 1.06 02/01/2016   INR 0.98 01/31/2016   01/2016 EKG: normal sinus rhythm.  01/2016 Echo - Left ventricle: Systolic function was moderately reduced. The   estimated ejection fraction was in the range of 35% to 40%.   Akinesis of the mid-apicalanteroseptal and apical myocardium.   Doppler parameters are consistent with abnormal left ventricular   relaxation (grade 1 diastolic dysfunction).  Anesthesia Physical Anesthesia Plan  ASA: IV  Anesthesia Plan: General   Post-op Pain Management:    Induction: Intravenous  Airway Management  Planned: Oral ETT  Additional Equipment: Arterial line, TEE, CVP, PA Cath and Ultrasound Guidance Line Placement  Intra-op Plan:   Post-operative Plan: Extubation in OR  Informed Consent: I have reviewed the patients History and Physical, chart, labs and discussed the procedure including the risks, benefits and alternatives for the proposed anesthesia with the patient or authorized representative who has indicated his/her understanding and acceptance.   Dental advisory given  Plan Discussed with: CRNA  Anesthesia Plan Comments:         Anesthesia Quick Evaluation

## 2016-02-09 NOTE — Progress Notes (Signed)
CARDIAC REHAB PHASE I   PRE:  Rate/Rhythm: 65 SR  BP:  Supine: 115/55  Sitting:   Standing:    SaO2: 95%RA  MODE:  Ambulation: 410 ft   POST:  Rate/Rhythm: 69  BP:  Supine:   Sitting: 125/67  Standing:    SaO2: 97%RA [id number] Pt stated feeling a little anxious. Seems sleepy today so did not walk as far. Pt walked 410 ft on RA with steady gait. Put on pre op video for pt and daughter to view. Emotional support given. Will see after surgery.   Luetta Nuttingharlene Cynthya Yam, RN BSN  02/09/2016 9:49 AM

## 2016-02-09 NOTE — Progress Notes (Signed)
ANTICOAGULATION CONSULT NOTE - Follow Up Consult  Pharmacy Consult for  Heparin Indication: CAD, awaiting CABG  Allergies  Allergen Reactions  . Aleve [Naproxen Sodium] Itching and Other (See Comments)    Reaction:  Redness on palms of hands/bottom of feet   . Codeine Nausea Only    Patient Measurements: Height: 5\' 1"  (154.9 cm) Weight: 112 lb 10.5 oz (51.1 kg) IBW/kg (Calculated) : 47.8 Heparin Dosing Weight: 51.1 kg  Vital Signs: Temp: 99.4 F (37.4 C) (09/06 0538) Temp Source: Oral (09/06 0538) BP: 111/53 (09/06 0538) Pulse Rate: 69 (09/06 0538)  Labs:  Recent Labs  02/07/16 0137  02/07/16 2100 02/08/16 0408 02/09/16 0422  HGB 14.4  --   --  13.8 13.4  HCT 42.8  --   --  41.8 39.2  PLT 202  --   --  176 177  HEPARINUNFRC 0.54  < > 0.46 0.48 0.25*  CREATININE  --   --   --   --  0.83  < > = values in this interval not displayed.  Estimated Creatinine Clearance: 44.2 mL/min (by C-G formula based on SCr of 0.83 mg/dL).  Assessment: Heparin level down to subtherapeutic (0.25) on 600 units/hr.  No issues reported with line per RN. No bleeding reported. CBC stable. CABG now scheduled for 9/7.  Goal of Therapy:  Heparin level 0.3-0.7 units/ml Monitor platelets by anticoagulation protocol: Yes   Plan:  Increase heparin drip to 700 units/hr. F/u 8 hr heparin level  Christoper Fabianaron Issa Kosmicki, PharmD, BCPS Clinical pharmacist, pager [phone] 02/09/2016,5:49 AM

## 2016-02-10 ENCOUNTER — Inpatient Hospital Stay (HOSPITAL_COMMUNITY): Payer: PPO

## 2016-02-10 ENCOUNTER — Inpatient Hospital Stay (HOSPITAL_COMMUNITY)
Admission: EM | Disposition: A | Discharge status: Home Health | Payer: Self-pay | Source: Home / Self Care | Attending: Cardiovascular Disease

## 2016-02-10 ENCOUNTER — Inpatient Hospital Stay (HOSPITAL_COMMUNITY): Payer: PPO | Admitting: Anesthesiology

## 2016-02-10 ENCOUNTER — Encounter (HOSPITAL_COMMUNITY): Payer: Self-pay | Admitting: Anesthesiology

## 2016-02-10 DIAGNOSIS — D62 Acute posthemorrhagic anemia: Secondary | ICD-10-CM | POA: Diagnosis not present

## 2016-02-10 DIAGNOSIS — I2510 Atherosclerotic heart disease of native coronary artery without angina pectoris: Secondary | ICD-10-CM | POA: Diagnosis not present

## 2016-02-10 DIAGNOSIS — I214 Non-ST elevation (NSTEMI) myocardial infarction: Secondary | ICD-10-CM | POA: Diagnosis not present

## 2016-02-10 DIAGNOSIS — D6959 Other secondary thrombocytopenia: Secondary | ICD-10-CM | POA: Diagnosis not present

## 2016-02-10 DIAGNOSIS — J9811 Atelectasis: Secondary | ICD-10-CM | POA: Diagnosis not present

## 2016-02-10 DIAGNOSIS — Z79890 Hormone replacement therapy (postmenopausal): Secondary | ICD-10-CM | POA: Diagnosis not present

## 2016-02-10 DIAGNOSIS — E8770 Fluid overload, unspecified: Secondary | ICD-10-CM | POA: Diagnosis not present

## 2016-02-10 DIAGNOSIS — D689 Coagulation defect, unspecified: Secondary | ICD-10-CM | POA: Diagnosis not present

## 2016-02-10 DIAGNOSIS — I080 Rheumatic disorders of both mitral and aortic valves: Secondary | ICD-10-CM | POA: Diagnosis not present

## 2016-02-10 DIAGNOSIS — Z791 Long term (current) use of non-steroidal anti-inflammatories (NSAID): Secondary | ICD-10-CM | POA: Diagnosis not present

## 2016-02-10 DIAGNOSIS — I25110 Atherosclerotic heart disease of native coronary artery with unstable angina pectoris: Secondary | ICD-10-CM | POA: Diagnosis not present

## 2016-02-10 DIAGNOSIS — J939 Pneumothorax, unspecified: Secondary | ICD-10-CM | POA: Diagnosis not present

## 2016-02-10 DIAGNOSIS — I5181 Takotsubo syndrome: Secondary | ICD-10-CM | POA: Diagnosis not present

## 2016-02-10 DIAGNOSIS — K219 Gastro-esophageal reflux disease without esophagitis: Secondary | ICD-10-CM | POA: Diagnosis not present

## 2016-02-10 DIAGNOSIS — M1990 Unspecified osteoarthritis, unspecified site: Secondary | ICD-10-CM | POA: Diagnosis not present

## 2016-02-10 HISTORY — PX: CORONARY ARTERY BYPASS GRAFT: SHX141

## 2016-02-10 HISTORY — PX: TEE WITHOUT CARDIOVERSION: SHX5443

## 2016-02-10 LAB — POCT I-STAT 3, ART BLOOD GAS (G3+)
ACID-BASE DEFICIT: 1 mmol/L (ref 0.0–2.0)
ACID-BASE DEFICIT: 1 mmol/L (ref 0.0–2.0)
ACID-BASE EXCESS: 3 mmol/L — AB (ref 0.0–2.0)
Acid-Base Excess: 2 mmol/L (ref 0.0–2.0)
Acid-Base Excess: 1 mmol/L (ref 0.0–2.0)
Acid-base deficit: 3 mmol/L — ABNORMAL HIGH (ref 0.0–2.0)
Acid-base deficit: 1 mmol/L (ref 0.0–2.0)
BICARBONATE: 26.4 mmol/L (ref 20.0–28.0)
BICARBONATE: 23.5 mmol/L (ref 20.0–28.0)
Bicarbonate: 25.6 mmol/L (ref 20.0–28.0)
Bicarbonate: 22.5 mmol/L (ref 20.0–28.0)
Bicarbonate: 23.4 mmol/L (ref 20.0–28.0)
Bicarbonate: 22.4 mmol/L (ref 20.0–28.0)
Bicarbonate: 23 mmol/L (ref 20.0–28.0)
O2 SAT: 100 %
O2 SAT: 100 %
O2 SAT: 96 %
O2 SAT: 100 %
O2 Saturation: 100 %
O2 Saturation: 100 %
O2 Saturation: 100 %
PCO2 ART: 39.8 mmHg (ref 32.0–48.0)
PCO2 ART: 26 mmHg — AB (ref 32.0–48.0)
PCO2 ART: 42.1 mmHg (ref 32.0–48.0)
PCO2 ART: 38.7 mmHg (ref 32.0–48.0)
PH ART: 7.43 (ref 7.350–7.450)
PH ART: 7.536 — AB (ref 7.350–7.450)
PH ART: 7.334 — AB (ref 7.350–7.450)
PH ART: 7.409 (ref 7.350–7.450)
PO2 ART: 437 mmHg — AB (ref 83.0–108.0)
PO2 ART: 216 mmHg — AB (ref 83.0–108.0)
Patient temperature: 36.9
TCO2: 28 mmol/L (ref 0–100)
TCO2: 26 mmol/L (ref 0–100)
TCO2: 23 mmol/L (ref 0–100)
TCO2: 24 mmol/L (ref 0–100)
TCO2: 24 mmol/L (ref 0–100)
TCO2: 24 mmol/L (ref 0–100)
TCO2: 25 mmol/L (ref 0–100)
pCO2 arterial: 29.7 mmHg — ABNORMAL LOW (ref 32.0–48.0)
pCO2 arterial: 27.4 mmHg — ABNORMAL LOW (ref 32.0–48.0)
pCO2 arterial: 37.1 mmHg (ref 32.0–48.0)
pH, Arterial: 7.6 — ABNORMAL HIGH (ref 7.350–7.450)
pH, Arterial: 7.487 — ABNORMAL HIGH (ref 7.350–7.450)
pH, Arterial: 7.39 (ref 7.350–7.450)
pO2, Arterial: 451 mmHg — ABNORMAL HIGH (ref 83.0–108.0)
pO2, Arterial: 164 mmHg — ABNORMAL HIGH (ref 83.0–108.0)
pO2, Arterial: 91 mmHg (ref 83.0–108.0)
pO2, Arterial: 186 mmHg — ABNORMAL HIGH (ref 83.0–108.0)
pO2, Arterial: 169 mmHg — ABNORMAL HIGH (ref 83.0–108.0)

## 2016-02-10 LAB — POCT I-STAT, CHEM 8
BUN: 11 mg/dL (ref 6–20)
BUN: 6 mg/dL (ref 6–20)
BUN: 8 mg/dL (ref 6–20)
BUN: 9 mg/dL (ref 6–20)
BUN: 8 mg/dL (ref 6–20)
BUN: 7 mg/dL (ref 6–20)
BUN: 8 mg/dL (ref 6–20)
CALCIUM ION: 0.74 mmol/L — AB (ref 1.15–1.40)
CALCIUM ION: 1.17 mmol/L (ref 1.15–1.40)
CALCIUM ION: 0.91 mmol/L — AB (ref 1.15–1.40)
CALCIUM ION: 1.22 mmol/L (ref 1.15–1.40)
CALCIUM ION: 1.09 mmol/L — AB (ref 1.15–1.40)
CHLORIDE: 101 mmol/L (ref 101–111)
CHLORIDE: 100 mmol/L — AB (ref 101–111)
CHLORIDE: 100 mmol/L — AB (ref 101–111)
CHLORIDE: 95 mmol/L — AB (ref 101–111)
CHLORIDE: 96 mmol/L — AB (ref 101–111)
CREATININE: 0.5 mg/dL (ref 0.44–1.00)
CREATININE: 0.5 mg/dL (ref 0.44–1.00)
CREATININE: 0.4 mg/dL — AB (ref 0.44–1.00)
Calcium, Ion: 1.2 mmol/L (ref 1.15–1.40)
Calcium, Ion: 1.22 mmol/L (ref 1.15–1.40)
Chloride: 99 mmol/L — ABNORMAL LOW (ref 101–111)
Chloride: 103 mmol/L (ref 101–111)
Creatinine, Ser: 0.5 mg/dL (ref 0.44–1.00)
Creatinine, Ser: 0.4 mg/dL — ABNORMAL LOW (ref 0.44–1.00)
Creatinine, Ser: 0.7 mg/dL (ref 0.44–1.00)
GLUCOSE: 119 mg/dL — AB (ref 65–99)
GLUCOSE: 157 mg/dL — AB (ref 65–99)
GLUCOSE: 173 mg/dL — AB (ref 65–99)
GLUCOSE: 155 mg/dL — AB (ref 65–99)
Glucose, Bld: 110 mg/dL — ABNORMAL HIGH (ref 65–99)
Glucose, Bld: 173 mg/dL — ABNORMAL HIGH (ref 65–99)
Glucose, Bld: 183 mg/dL — ABNORMAL HIGH (ref 65–99)
HCT: 18 % — ABNORMAL LOW (ref 36.0–46.0)
HCT: 30 % — ABNORMAL LOW (ref 36.0–46.0)
HCT: 28 % — ABNORMAL LOW (ref 36.0–46.0)
HCT: 26 % — ABNORMAL LOW (ref 36.0–46.0)
HEMATOCRIT: 33 % — AB (ref 36.0–46.0)
HEMATOCRIT: 33 % — AB (ref 36.0–46.0)
HEMATOCRIT: 24 % — AB (ref 36.0–46.0)
HEMOGLOBIN: 8.2 g/dL — AB (ref 12.0–15.0)
Hemoglobin: 11.2 g/dL — ABNORMAL LOW (ref 12.0–15.0)
Hemoglobin: 10.2 g/dL — ABNORMAL LOW (ref 12.0–15.0)
Hemoglobin: 11.2 g/dL — ABNORMAL LOW (ref 12.0–15.0)
Hemoglobin: 6.1 g/dL — CL (ref 12.0–15.0)
Hemoglobin: 9.5 g/dL — ABNORMAL LOW (ref 12.0–15.0)
Hemoglobin: 8.8 g/dL — ABNORMAL LOW (ref 12.0–15.0)
POTASSIUM: 3.6 mmol/L (ref 3.5–5.1)
POTASSIUM: 3.4 mmol/L — AB (ref 3.5–5.1)
POTASSIUM: 3.8 mmol/L (ref 3.5–5.1)
Potassium: 3.9 mmol/L (ref 3.5–5.1)
Potassium: 5 mmol/L (ref 3.5–5.1)
Potassium: 4.3 mmol/L (ref 3.5–5.1)
Potassium: 4.2 mmol/L (ref 3.5–5.1)
SODIUM: 131 mmol/L — AB (ref 135–145)
SODIUM: 132 mmol/L — AB (ref 135–145)
SODIUM: 139 mmol/L (ref 135–145)
Sodium: 139 mmol/L (ref 135–145)
Sodium: 136 mmol/L (ref 135–145)
Sodium: 137 mmol/L (ref 135–145)
Sodium: 138 mmol/L (ref 135–145)
TCO2: 27 mmol/L (ref 0–100)
TCO2: 25 mmol/L (ref 0–100)
TCO2: 25 mmol/L (ref 0–100)
TCO2: 28 mmol/L (ref 0–100)
TCO2: 26 mmol/L (ref 0–100)
TCO2: 22 mmol/L (ref 0–100)
TCO2: 24 mmol/L (ref 0–100)

## 2016-02-10 LAB — CBC
HCT: 31.2 % — ABNORMAL LOW (ref 36.0–46.0)
HCT: 29.1 % — ABNORMAL LOW (ref 36.0–46.0)
HEMATOCRIT: 39.2 % (ref 36.0–46.0)
Hemoglobin: 13.3 g/dL (ref 12.0–15.0)
Hemoglobin: 10.6 g/dL — ABNORMAL LOW (ref 12.0–15.0)
Hemoglobin: 10.2 g/dL — ABNORMAL LOW (ref 12.0–15.0)
MCH: 30.3 pg (ref 26.0–34.0)
MCH: 29.9 pg (ref 26.0–34.0)
MCH: 30.3 pg (ref 26.0–34.0)
MCHC: 33.9 g/dL (ref 30.0–36.0)
MCHC: 34 g/dL (ref 30.0–36.0)
MCHC: 35.1 g/dL (ref 30.0–36.0)
MCV: 89.3 fL (ref 78.0–100.0)
MCV: 88.1 fL (ref 78.0–100.0)
MCV: 86.4 fL (ref 78.0–100.0)
PLATELETS: 60 10*3/uL — AB (ref 150–400)
Platelets: 172 10*3/uL (ref 150–400)
Platelets: 84 10*3/uL — ABNORMAL LOW (ref 150–400)
RBC: 4.39 MIL/uL (ref 3.87–5.11)
RBC: 3.54 MIL/uL — ABNORMAL LOW (ref 3.87–5.11)
RBC: 3.37 MIL/uL — ABNORMAL LOW (ref 3.87–5.11)
RDW: 12.1 % (ref 11.5–15.5)
RDW: 12 % (ref 11.5–15.5)
RDW: 12.4 % (ref 11.5–15.5)
WBC: 11.7 10*3/uL — AB (ref 4.0–10.5)
WBC: 16.8 10*3/uL — AB (ref 4.0–10.5)
WBC: 16.1 10*3/uL — ABNORMAL HIGH (ref 4.0–10.5)

## 2016-02-10 LAB — CREATININE, SERUM
Creatinine, Ser: 0.75 mg/dL (ref 0.44–1.00)
GFR calc Af Amer: >60 mL/min (ref >60)
GFR calc non Af Amer: >60 mL/min (ref >60)

## 2016-02-10 LAB — POCT I-STAT 4, (NA,K, GLUC, HGB,HCT)
Glucose, Bld: 121 mg/dL — ABNORMAL HIGH (ref 65–99)
HEMATOCRIT: 30 % — AB (ref 36.0–46.0)
HEMOGLOBIN: 10.2 g/dL — AB (ref 12.0–15.0)
Potassium: 3.5 mmol/L (ref 3.5–5.1)
Sodium: 139 mmol/L (ref 135–145)

## 2016-02-10 LAB — BASIC METABOLIC PANEL
Anion gap: 7 (ref 5–15)
BUN: 10 mg/dL (ref 6–20)
CO2: 24 mmol/L (ref 22–32)
Calcium: 8.8 mg/dL — ABNORMAL LOW (ref 8.9–10.3)
Chloride: 106 mmol/L (ref 101–111)
Creatinine, Ser: 0.83 mg/dL (ref 0.44–1.00)
GFR calc Af Amer: >60 mL/min (ref >60)
GFR calc non Af Amer: >60 mL/min (ref >60)
Glucose, Bld: 123 mg/dL — ABNORMAL HIGH (ref 65–99)
Potassium: 3.6 mmol/L (ref 3.5–5.1)
Sodium: 137 mmol/L (ref 135–145)

## 2016-02-10 LAB — HEMOGLOBIN AND HEMATOCRIT, BLOOD
HCT: 30.7 % — ABNORMAL LOW (ref 36.0–46.0)
Hemoglobin: 10.2 g/dL — ABNORMAL LOW (ref 12.0–15.0)

## 2016-02-10 LAB — PROTIME-INR
INR: 1.5
Prothrombin Time: 18.3 seconds — ABNORMAL HIGH (ref 11.4–15.2)

## 2016-02-10 LAB — GLUCOSE, CAPILLARY
GLUCOSE-CAPILLARY: 96 mg/dL (ref 65–99)
GLUCOSE-CAPILLARY: 93 mg/dL (ref 65–99)
GLUCOSE-CAPILLARY: 134 mg/dL — AB (ref 65–99)
GLUCOSE-CAPILLARY: 141 mg/dL — AB (ref 65–99)
Glucose-Capillary: 90 mg/dL (ref 65–99)
Glucose-Capillary: 124 mg/dL — ABNORMAL HIGH (ref 65–99)
Glucose-Capillary: 127 mg/dL — ABNORMAL HIGH (ref 65–99)
Glucose-Capillary: 177 mg/dL — ABNORMAL HIGH (ref 65–99)

## 2016-02-10 LAB — PREPARE RBC (CROSSMATCH)

## 2016-02-10 LAB — APTT: APTT: 39 s — AB (ref 24–36)

## 2016-02-10 LAB — MAGNESIUM: Magnesium: 3.4 mg/dL — ABNORMAL HIGH (ref 1.7–2.4)

## 2016-02-10 SURGERY — CORONARY ARTERY BYPASS GRAFTING (CABG)
Anesthesia: General | Site: Chest

## 2016-02-10 MED ORDER — ASPIRIN EC 325 MG PO TBEC
325.0000 mg | DELAYED_RELEASE_TABLET | Freq: Every day | ORAL | Status: DC
  Administered 2016-02-11: 325 mg via ORAL
  Filled 2016-02-10: qty 1

## 2016-02-10 MED ORDER — CHLORHEXIDINE GLUCONATE 4 % EX LIQD
CUTANEOUS | Status: AC
  Filled 2016-02-10: qty 15

## 2016-02-10 MED ORDER — MAGNESIUM SULFATE 4 GM/100ML IV SOLN
4.0000 g | Freq: Once | INTRAVENOUS | Status: AC
  Administered 2016-02-10: 4 g via INTRAVENOUS
  Filled 2016-02-10: qty 100

## 2016-02-10 MED ORDER — PHENYLEPHRINE HCL 10 MG/ML IJ SOLN
0.0000 ug/min | INTRAVENOUS | Status: DC
  Filled 2016-02-10 (×2): qty 2

## 2016-02-10 MED ORDER — POTASSIUM CHLORIDE 10 MEQ/50ML IV SOLN
10.0000 meq | INTRAVENOUS | Status: AC
  Administered 2016-02-10 (×3): 10 meq via INTRAVENOUS

## 2016-02-10 MED ORDER — CHLORHEXIDINE GLUCONATE 0.12 % MT SOLN
15.0000 mL | OROMUCOSAL | Status: AC
  Administered 2016-02-10: 15 mL via OROMUCOSAL

## 2016-02-10 MED ORDER — 0.9 % SODIUM CHLORIDE (POUR BTL) OPTIME
TOPICAL | Status: DC | PRN
  Administered 2016-02-10: 6000 mL

## 2016-02-10 MED ORDER — SODIUM CHLORIDE 0.9% FLUSH
3.0000 mL | Freq: Two times a day (BID) | INTRAVENOUS | Status: DC
  Administered 2016-02-12 – 2016-02-15 (×4): 3 mL via INTRAVENOUS

## 2016-02-10 MED ORDER — ROCURONIUM BROMIDE 10 MG/ML (PF) SYRINGE
PREFILLED_SYRINGE | INTRAVENOUS | Status: DC | PRN
  Administered 2016-02-10: 50 mg via INTRAVENOUS
  Administered 2016-02-10: 30 mg via INTRAVENOUS
  Administered 2016-02-10 (×2): 20 mg via INTRAVENOUS

## 2016-02-10 MED ORDER — SODIUM CHLORIDE 0.45 % IV SOLN: INTRAVENOUS | Status: DC | PRN

## 2016-02-10 MED ORDER — FENTANYL CITRATE (PF) 250 MCG/5ML IJ SOLN
INTRAMUSCULAR | Status: AC
  Filled 2016-02-10: qty 15

## 2016-02-10 MED ORDER — THROMBIN 5000 UNITS EX SOLR
CUTANEOUS | Status: AC
  Filled 2016-02-10: qty 5000

## 2016-02-10 MED ORDER — BISACODYL 5 MG PO TBEC
10.0000 mg | DELAYED_RELEASE_TABLET | Freq: Every day | ORAL | Status: DC
  Administered 2016-02-11 – 2016-02-12 (×2): 10 mg via ORAL
  Filled 2016-02-10 (×2): qty 2

## 2016-02-10 MED ORDER — MILRINONE LACTATE IN DEXTROSE 20-5 MG/100ML-% IV SOLN
0.1250 ug/kg/min | INTRAVENOUS | Status: AC
  Administered 2016-02-10: .25 ug/kg/min via INTRAVENOUS
  Filled 2016-02-10: qty 100

## 2016-02-10 MED ORDER — PROTAMINE SULFATE 10 MG/ML IV SOLN
INTRAVENOUS | Status: DC | PRN
  Administered 2016-02-10: 20 mg via INTRAVENOUS
  Administered 2016-02-10 (×2): 50 mg via INTRAVENOUS
  Administered 2016-02-10: 25 mg via INTRAVENOUS
  Administered 2016-02-10: 35 mg via INTRAVENOUS
  Administered 2016-02-10: 50 mg via INTRAVENOUS

## 2016-02-10 MED ORDER — DOPAMINE-DEXTROSE 3.2-5 MG/ML-% IV SOLN: 0.0000 ug/kg/min | INTRAVENOUS | Status: DC

## 2016-02-10 MED ORDER — SODIUM CHLORIDE 0.9 % IJ SOLN
INTRAMUSCULAR | Status: AC
  Filled 2016-02-10: qty 10

## 2016-02-10 MED ORDER — METOCLOPRAMIDE HCL 5 MG/ML IJ SOLN: 10.0000 mg | Freq: Four times a day (QID) | INTRAMUSCULAR | Status: DC

## 2016-02-10 MED ORDER — DEXMEDETOMIDINE HCL IN NACL 200 MCG/50ML IV SOLN
0.0000 ug/kg/h | INTRAVENOUS | Status: DC
  Filled 2016-02-10: qty 50

## 2016-02-10 MED ORDER — ATORVASTATIN CALCIUM 40 MG PO TABS
40.0000 mg | ORAL_TABLET | Freq: Every day | ORAL | Status: DC
  Administered 2016-02-11 – 2016-02-15 (×5): 40 mg via ORAL
  Filled 2016-02-10 (×5): qty 1

## 2016-02-10 MED ORDER — NITROGLYCERIN IN D5W 200-5 MCG/ML-% IV SOLN: 0.0000 ug/min | INTRAVENOUS | Status: DC

## 2016-02-10 MED ORDER — SODIUM CHLORIDE 0.9% FLUSH: 3.0000 mL | INTRAVENOUS | Status: DC | PRN

## 2016-02-10 MED ORDER — ALBUMIN HUMAN 5 % IV SOLN
250.0000 mL | INTRAVENOUS | Status: AC | PRN
  Administered 2016-02-10 (×2): 250 mL via INTRAVENOUS
  Filled 2016-02-10: qty 250

## 2016-02-10 MED ORDER — FAMOTIDINE IN NACL 20-0.9 MG/50ML-% IV SOLN
20.0000 mg | Freq: Two times a day (BID) | INTRAVENOUS | Status: AC
  Administered 2016-02-10: 20 mg via INTRAVENOUS

## 2016-02-10 MED ORDER — SODIUM CHLORIDE 0.9 % IV SOLN: Freq: Once | INTRAVENOUS | Status: DC

## 2016-02-10 MED ORDER — VANCOMYCIN HCL IN DEXTROSE 1-5 GM/200ML-% IV SOLN
1000.0000 mg | Freq: Two times a day (BID) | INTRAVENOUS | Status: AC
  Administered 2016-02-10 – 2016-02-11 (×2): 1000 mg via INTRAVENOUS
  Filled 2016-02-10 (×2): qty 200

## 2016-02-10 MED ORDER — ORAL CARE MOUTH RINSE
15.0000 mL | Freq: Four times a day (QID) | OROMUCOSAL | Status: DC
  Administered 2016-02-10 – 2016-02-14 (×9): 15 mL via OROMUCOSAL

## 2016-02-10 MED ORDER — LIDOCAINE 2% (20 MG/ML) 5 ML SYRINGE
INTRAMUSCULAR | Status: AC
  Filled 2016-02-10: qty 5

## 2016-02-10 MED ORDER — SODIUM CHLORIDE 0.9 % IV SOLN: 250.0000 mL | INTRAVENOUS | Status: DC

## 2016-02-10 MED ORDER — MORPHINE SULFATE (PF) 2 MG/ML IV SOLN
2.0000 mg | INTRAVENOUS | Status: DC | PRN
  Administered 2016-02-11 (×4): 2 mg via INTRAVENOUS
  Filled 2016-02-10 (×5): qty 1

## 2016-02-10 MED ORDER — LACTATED RINGERS IV SOLN: INTRAVENOUS | Status: DC

## 2016-02-10 MED ORDER — PANTOPRAZOLE SODIUM 40 MG PO TBEC
40.0000 mg | DELAYED_RELEASE_TABLET | Freq: Every day | ORAL | Status: DC
  Administered 2016-02-12 – 2016-02-16 (×5): 40 mg via ORAL
  Filled 2016-02-10 (×5): qty 1

## 2016-02-10 MED ORDER — OXYCODONE HCL 5 MG PO TABS
5.0000 mg | ORAL_TABLET | ORAL | Status: DC | PRN
  Administered 2016-02-11: 5 mg via ORAL
  Filled 2016-02-10: qty 2
  Filled 2016-02-10: qty 1

## 2016-02-10 MED ORDER — METOCLOPRAMIDE HCL 5 MG/ML IJ SOLN
10.0000 mg | Freq: Four times a day (QID) | INTRAMUSCULAR | Status: DC
  Administered 2016-02-10 – 2016-02-12 (×9): 10 mg via INTRAVENOUS
  Filled 2016-02-10 (×10): qty 2

## 2016-02-10 MED ORDER — MIDAZOLAM HCL 10 MG/2ML IJ SOLN
INTRAMUSCULAR | Status: AC
  Filled 2016-02-10: qty 2

## 2016-02-10 MED ORDER — METOPROLOL TARTRATE 25 MG/10 ML ORAL SUSPENSION: 12.5000 mg | Freq: Two times a day (BID) | ORAL | Status: DC

## 2016-02-10 MED ORDER — ACETAMINOPHEN 160 MG/5ML PO SOLN
1000.0000 mg | Freq: Four times a day (QID) | ORAL | Status: DC
  Filled 2016-02-10: qty 40.6

## 2016-02-10 MED ORDER — ARTIFICIAL TEARS OP OINT
TOPICAL_OINTMENT | OPHTHALMIC | Status: DC | PRN
  Administered 2016-02-10: 1 via OPHTHALMIC

## 2016-02-10 MED ORDER — HEMOSTATIC AGENTS (NO CHARGE) OPTIME
TOPICAL | Status: DC | PRN
  Administered 2016-02-10: 1 via TOPICAL

## 2016-02-10 MED ORDER — LACTATED RINGERS IV SOLN
500.0000 mL | Freq: Once | INTRAVENOUS | Status: DC | PRN
Start: 2016-02-10 — End: 2016-02-10

## 2016-02-10 MED ORDER — ONDANSETRON HCL 4 MG/2ML IJ SOLN
4.0000 mg | Freq: Four times a day (QID) | INTRAMUSCULAR | Status: DC | PRN
  Administered 2016-02-11: 4 mg via INTRAVENOUS
  Filled 2016-02-10: qty 2

## 2016-02-10 MED ORDER — SODIUM CHLORIDE 0.9 % IV SOLN
INTRAVENOUS | Status: DC
  Filled 2016-02-10 (×2): qty 2.5

## 2016-02-10 MED ORDER — MIDAZOLAM HCL 5 MG/5ML IJ SOLN
INTRAMUSCULAR | Status: DC | PRN
  Administered 2016-02-10 (×2): 2 mg via INTRAVENOUS
  Administered 2016-02-10: 1 mg via INTRAVENOUS
  Administered 2016-02-10 (×2): 2 mg via INTRAVENOUS
  Administered 2016-02-10: 1 mg via INTRAVENOUS

## 2016-02-10 MED ORDER — HEMOSTATIC AGENTS (NO CHARGE) OPTIME
TOPICAL | Status: DC | PRN
Start: 2016-02-10 — End: 2016-02-10
  Administered 2016-02-10: 1 via TOPICAL

## 2016-02-10 MED ORDER — ACETAMINOPHEN 650 MG RE SUPP
650.0000 mg | Freq: Once | RECTAL | Status: AC
  Administered 2016-02-10: 650 mg via RECTAL

## 2016-02-10 MED ORDER — CHLORHEXIDINE GLUCONATE 0.12 % MT SOLN
15.0000 mL | Freq: Two times a day (BID) | OROMUCOSAL | Status: DC
  Administered 2016-02-10 – 2016-02-16 (×8): 15 mL via OROMUCOSAL
  Filled 2016-02-10 (×7): qty 15

## 2016-02-10 MED ORDER — PROPOFOL 10 MG/ML IV BOLUS
INTRAVENOUS | Status: DC | PRN
  Administered 2016-02-10: 20 mg via INTRAVENOUS
  Administered 2016-02-10: 50 mg via INTRAVENOUS
  Administered 2016-02-10: 20 mg via INTRAVENOUS

## 2016-02-10 MED ORDER — METOPROLOL TARTRATE 12.5 MG HALF TABLET
12.5000 mg | ORAL_TABLET | Freq: Two times a day (BID) | ORAL | Status: DC
  Administered 2016-02-11: 12.5 mg via ORAL
  Filled 2016-02-10: qty 1

## 2016-02-10 MED ORDER — TRAMADOL HCL 50 MG PO TABS
50.0000 mg | ORAL_TABLET | ORAL | Status: DC | PRN
  Administered 2016-02-11 – 2016-02-12 (×2): 100 mg via ORAL
  Filled 2016-02-10 (×2): qty 2
  Filled 2016-02-10: qty 1

## 2016-02-10 MED ORDER — SODIUM CHLORIDE 0.9 % IJ SOLN
INTRAMUSCULAR | Status: DC | PRN
  Administered 2016-02-10 (×3): 4 mL via TOPICAL

## 2016-02-10 MED ORDER — MORPHINE SULFATE (PF) 2 MG/ML IV SOLN: 1.0000 mg | INTRAVENOUS | Status: AC | PRN

## 2016-02-10 MED ORDER — VANCOMYCIN HCL IN DEXTROSE 1-5 GM/200ML-% IV SOLN
1000.0000 mg | Freq: Once | INTRAVENOUS | Status: DC
  Filled 2016-02-10: qty 200

## 2016-02-10 MED ORDER — DEXTROSE 5 % IV SOLN
1.5000 g | Freq: Two times a day (BID) | INTRAVENOUS | Status: AC
  Administered 2016-02-10 – 2016-02-12 (×4): 1.5 g via INTRAVENOUS
  Filled 2016-02-10 (×5): qty 1.5

## 2016-02-10 MED ORDER — SODIUM CHLORIDE 0.9 % IV SOLN: Freq: Once | INTRAVENOUS | Status: AC

## 2016-02-10 MED ORDER — BISACODYL 10 MG RE SUPP: 10.0000 mg | Freq: Every day | RECTAL | Status: DC

## 2016-02-10 MED ORDER — HEPARIN SODIUM (PORCINE) 1000 UNIT/ML IJ SOLN
INTRAMUSCULAR | Status: AC
  Filled 2016-02-10: qty 1

## 2016-02-10 MED ORDER — ARTIFICIAL TEARS OP OINT
TOPICAL_OINTMENT | OPHTHALMIC | Status: AC
  Filled 2016-02-10: qty 3.5

## 2016-02-10 MED ORDER — ACETAMINOPHEN 160 MG/5ML PO SOLN: 650.0000 mg | Freq: Once | ORAL | Status: AC

## 2016-02-10 MED ORDER — MIDAZOLAM HCL 2 MG/2ML IJ SOLN: 2.0000 mg | INTRAMUSCULAR | Status: DC | PRN

## 2016-02-10 MED ORDER — FENTANYL CITRATE (PF) 250 MCG/5ML IJ SOLN
INTRAMUSCULAR | Status: DC | PRN
  Administered 2016-02-10 (×8): 50 ug via INTRAVENOUS
  Administered 2016-02-10: 250 ug via INTRAVENOUS
  Administered 2016-02-10 (×2): 50 ug via INTRAVENOUS

## 2016-02-10 MED ORDER — INSULIN REGULAR BOLUS VIA INFUSION
0.0000 [IU] | Freq: Three times a day (TID) | INTRAVENOUS | Status: DC
  Filled 2016-02-10: qty 10

## 2016-02-10 MED ORDER — DOCUSATE SODIUM 100 MG PO CAPS
200.0000 mg | ORAL_CAPSULE | Freq: Every day | ORAL | Status: DC
  Administered 2016-02-11 – 2016-02-12 (×2): 200 mg via ORAL
  Filled 2016-02-10 (×2): qty 2

## 2016-02-10 MED ORDER — POTASSIUM CHLORIDE 10 MEQ/50ML IV SOLN
10.0000 meq | Freq: Once | INTRAVENOUS | Status: AC
  Administered 2016-02-10: 10 meq via INTRAVENOUS

## 2016-02-10 MED ORDER — HEPARIN SODIUM (PORCINE) 1000 UNIT/ML IJ SOLN
INTRAMUSCULAR | Status: DC | PRN
  Administered 2016-02-10: 19000 [IU] via INTRAVENOUS
  Administered 2016-02-10 (×2): 2000 [IU] via INTRAVENOUS

## 2016-02-10 MED ORDER — ROCURONIUM BROMIDE 10 MG/ML (PF) SYRINGE
PREFILLED_SYRINGE | INTRAVENOUS | Status: AC
  Filled 2016-02-10: qty 20

## 2016-02-10 MED ORDER — SODIUM CHLORIDE 0.9 % IV SOLN: INTRAVENOUS | Status: DC

## 2016-02-10 MED ORDER — PROPOFOL 10 MG/ML IV BOLUS
INTRAVENOUS | Status: AC
  Filled 2016-02-10: qty 20

## 2016-02-10 MED ORDER — PROTAMINE SULFATE 10 MG/ML IV SOLN
INTRAVENOUS | Status: AC
  Filled 2016-02-10: qty 25

## 2016-02-10 MED ORDER — METOPROLOL TARTRATE 5 MG/5ML IV SOLN
2.5000 mg | INTRAVENOUS | Status: DC | PRN
  Administered 2016-02-10: 1.5 mg via INTRAVENOUS

## 2016-02-10 MED ORDER — ACETAMINOPHEN 500 MG PO TABS
1000.0000 mg | ORAL_TABLET | Freq: Four times a day (QID) | ORAL | Status: AC
  Administered 2016-02-10 – 2016-02-14 (×14): 1000 mg via ORAL
  Administered 2016-02-14: 500 mg via ORAL
  Administered 2016-02-14 – 2016-02-15 (×4): 1000 mg via ORAL
  Filled 2016-02-10 (×19): qty 2

## 2016-02-10 MED ORDER — LACTATED RINGERS IV SOLN
INTRAVENOUS | Status: DC | PRN
  Administered 2016-02-10: 07:00:00 via INTRAVENOUS

## 2016-02-10 MED ORDER — MILRINONE LACTATE IN DEXTROSE 20-5 MG/100ML-% IV SOLN
0.2000 ug/kg/min | INTRAVENOUS | Status: DC
  Filled 2016-02-10: qty 100

## 2016-02-10 MED ORDER — ASPIRIN 81 MG PO CHEW: 324.0000 mg | CHEWABLE_TABLET | Freq: Every day | ORAL | Status: DC

## 2016-02-10 MED FILL — Potassium Chloride Inj 2 mEq/ML: INTRAVENOUS | Qty: 40 | Status: AC

## 2016-02-10 MED FILL — Heparin Sodium (Porcine) Inj 1000 Unit/ML: INTRAMUSCULAR | Qty: 30 | Status: AC

## 2016-02-10 MED FILL — Magnesium Sulfate Inj 50%: INTRAMUSCULAR | Qty: 10 | Status: AC

## 2016-02-10 SURGICAL SUPPLY — 104 items
ADAPTER CARDIO PERF ANTE/RETRO (ADAPTER) ×4 IMPLANT
ADH SKN CLS APL DERMABOND .7 (GAUZE/BANDAGES/DRESSINGS) ×2
ADPR PRFSN 84XANTGRD RTRGD (ADAPTER) ×2
APL SKNCLS STERI-STRIP NONHPOA (GAUZE/BANDAGES/DRESSINGS) ×2
BAG DECANTER FOR FLEXI CONT (MISCELLANEOUS) ×4 IMPLANT
BANDAGE ACE 4X5 VEL STRL LF (GAUZE/BANDAGES/DRESSINGS) ×2 IMPLANT
BANDAGE ACE 6X5 VEL STRL LF (GAUZE/BANDAGES/DRESSINGS) ×2 IMPLANT
BANDAGE ELASTIC 4 VELCRO ST LF (GAUZE/BANDAGES/DRESSINGS) ×2 IMPLANT
BANDAGE ELASTIC 6 VELCRO ST LF (GAUZE/BANDAGES/DRESSINGS) ×2 IMPLANT
BASKET HEART  (ORDER IN 25'S) (MISCELLANEOUS) ×1
BASKET HEART (ORDER IN 25'S) (MISCELLANEOUS) ×1
BASKET HEART (ORDER IN 25S) (MISCELLANEOUS) ×2 IMPLANT
BENZOIN TINCTURE PRP APPL 2/3 (GAUZE/BANDAGES/DRESSINGS) ×2 IMPLANT
BLADE STERNUM SYSTEM 6 (BLADE) ×4 IMPLANT
BLADE SURG 12 STRL SS (BLADE) ×4 IMPLANT
BLADE SURG ROTATE 9660 (MISCELLANEOUS) IMPLANT
BNDG GAUZE ELAST 4 BULKY (GAUZE/BANDAGES/DRESSINGS) ×4 IMPLANT
CANISTER SUCTION 2500CC (MISCELLANEOUS) ×4 IMPLANT
CANNULA GUNDRY RCSP 15FR (MISCELLANEOUS) ×4 IMPLANT
CATH CPB KIT VANTRIGT (MISCELLANEOUS) ×4 IMPLANT
CATH ROBINSON RED A/P 18FR (CATHETERS) ×12 IMPLANT
CATH THORACIC 36FR RT ANG (CATHETERS) ×4 IMPLANT
CLIP TI WIDE RED SMALL 24 (CLIP) ×4 IMPLANT
CRADLE DONUT ADULT HEAD (MISCELLANEOUS) ×4 IMPLANT
DERMABOND ADVANCED (GAUZE/BANDAGES/DRESSINGS) ×2
DERMABOND ADVANCED .7 DNX12 (GAUZE/BANDAGES/DRESSINGS) IMPLANT
DRAIN CHANNEL 32F RND 10.7 FF (WOUND CARE) ×4 IMPLANT
DRAPE CARDIOVASCULAR INCISE (DRAPES) ×4
DRAPE SLUSH/WARMER DISC (DRAPES) ×4 IMPLANT
DRAPE SRG 135X102X78XABS (DRAPES) ×2 IMPLANT
DRSG AQUACEL AG ADV 3.5X14 (GAUZE/BANDAGES/DRESSINGS) ×4 IMPLANT
ELECT BLADE 4.0 EZ CLEAN MEGAD (MISCELLANEOUS) ×4
ELECT BLADE 6.5 EXT (BLADE) ×4 IMPLANT
ELECT CAUTERY BLADE 6.4 (BLADE) ×4 IMPLANT
ELECT REM PT RETURN 9FT ADLT (ELECTROSURGICAL) ×8
ELECTRODE BLDE 4.0 EZ CLN MEGD (MISCELLANEOUS) ×2 IMPLANT
ELECTRODE REM PT RTRN 9FT ADLT (ELECTROSURGICAL) ×4 IMPLANT
FELT TEFLON 1X6 (MISCELLANEOUS) ×8 IMPLANT
GAUZE SPONGE 4X4 12PLY STRL (GAUZE/BANDAGES/DRESSINGS) ×8 IMPLANT
GLOVE BIO SURGEON STRL SZ7.5 (GLOVE) ×12 IMPLANT
GLOVE BIOGEL M 7.0 STRL (GLOVE) ×6 IMPLANT
GLOVE BIOGEL M STER SZ 6 (GLOVE) ×6 IMPLANT
GLOVE BIOGEL PI IND STRL 6.5 (GLOVE) IMPLANT
GLOVE BIOGEL PI INDICATOR 6.5 (GLOVE) ×6
GLOVE SURG SS PI 6.0 STRL IVOR (GLOVE) ×4 IMPLANT
GOWN STRL REUS W/ TWL LRG LVL3 (GOWN DISPOSABLE) ×8 IMPLANT
GOWN STRL REUS W/TWL LRG LVL3 (GOWN DISPOSABLE) ×32
HEMOSTAT POWDER SURGIFOAM 1G (HEMOSTASIS) ×12 IMPLANT
HEMOSTAT SURGICEL 2X14 (HEMOSTASIS) ×4 IMPLANT
INSERT FOGARTY XLG (MISCELLANEOUS) IMPLANT
KIT BASIN OR (CUSTOM PROCEDURE TRAY) ×4 IMPLANT
KIT ROOM TURNOVER OR (KITS) ×4 IMPLANT
KIT SUCTION CATH 14FR (SUCTIONS) ×4 IMPLANT
KIT VASOVIEW HEMOPRO VH 3000 (KITS) ×4 IMPLANT
LEAD PACING MYOCARDI (MISCELLANEOUS) ×4 IMPLANT
MARKER GRAFT CORONARY BYPASS (MISCELLANEOUS) ×12 IMPLANT
NS IRRIG 1000ML POUR BTL (IV SOLUTION) ×22 IMPLANT
PACK OPEN HEART (CUSTOM PROCEDURE TRAY) ×4 IMPLANT
PAD ARMBOARD 7.5X6 YLW CONV (MISCELLANEOUS) ×8 IMPLANT
PAD ELECT DEFIB RADIOL ZOLL (MISCELLANEOUS) ×4 IMPLANT
PENCIL BUTTON HOLSTER BLD 10FT (ELECTRODE) ×4 IMPLANT
PUNCH AORTIC ROT 4.0MM RCL 40 (MISCELLANEOUS) ×2 IMPLANT
PUNCH AORTIC ROTATE 4.0MM (MISCELLANEOUS) IMPLANT
PUNCH AORTIC ROTATE 4.5MM 8IN (MISCELLANEOUS) IMPLANT
PUNCH AORTIC ROTATE 5MM 8IN (MISCELLANEOUS) IMPLANT
SET CARDIOPLEGIA MPS 5001102 (MISCELLANEOUS) ×2 IMPLANT
SPONGE GAUZE 4X4 12PLY STER LF (GAUZE/BANDAGES/DRESSINGS) ×4 IMPLANT
SPONGE LAP 18X18 X RAY DECT (DISPOSABLE) ×2 IMPLANT
SPONGE LAP 4X18 X RAY DECT (DISPOSABLE) ×2 IMPLANT
SURGIFLO W/THROMBIN 8M KIT (HEMOSTASIS) ×4 IMPLANT
SUT BONE WAX W31G (SUTURE) ×4 IMPLANT
SUT MNCRL AB 4-0 PS2 18 (SUTURE) ×2 IMPLANT
SUT PROLENE 3 0 SH DA (SUTURE) IMPLANT
SUT PROLENE 3 0 SH1 36 (SUTURE) IMPLANT
SUT PROLENE 4 0 RB 1 (SUTURE) ×4
SUT PROLENE 4 0 SH DA (SUTURE) ×6 IMPLANT
SUT PROLENE 4-0 RB1 .5 CRCL 36 (SUTURE) ×2 IMPLANT
SUT PROLENE 5 0 C 1 36 (SUTURE) IMPLANT
SUT PROLENE 6 0 C 1 30 (SUTURE) ×4 IMPLANT
SUT PROLENE 6 0 CC (SUTURE) ×12 IMPLANT
SUT PROLENE 8 0 BV175 6 (SUTURE) ×6 IMPLANT
SUT PROLENE BLUE 7 0 (SUTURE) ×4 IMPLANT
SUT SILK  1 MH (SUTURE)
SUT SILK 1 MH (SUTURE) IMPLANT
SUT SILK 2 0 SH CR/8 (SUTURE) IMPLANT
SUT SILK 3 0 SH CR/8 (SUTURE) IMPLANT
SUT STEEL 6MS V (SUTURE) ×6 IMPLANT
SUT STEEL SZ 6 DBL 3X14 BALL (SUTURE) ×4 IMPLANT
SUT VIC AB 1 CTX 36 (SUTURE) ×8
SUT VIC AB 1 CTX36XBRD ANBCTR (SUTURE) ×4 IMPLANT
SUT VIC AB 2-0 CT1 27 (SUTURE) ×8
SUT VIC AB 2-0 CT1 TAPERPNT 27 (SUTURE) IMPLANT
SUT VIC AB 2-0 CTX 27 (SUTURE) IMPLANT
SUT VIC AB 3-0 X1 27 (SUTURE) IMPLANT
SUTURE E-PAK OPEN HEART (SUTURE) ×4 IMPLANT
SYSTEM SAHARA CHEST DRAIN ATS (WOUND CARE) ×4 IMPLANT
TAPE CLOTH SURG 4X10 WHT LF (GAUZE/BANDAGES/DRESSINGS) ×4 IMPLANT
TOWEL OR 17X24 6PK STRL BLUE (TOWEL DISPOSABLE) ×8 IMPLANT
TOWEL OR 17X26 10 PK STRL BLUE (TOWEL DISPOSABLE) ×8 IMPLANT
TRAY FOLEY IC TEMP SENS 14FR (CATHETERS) ×2 IMPLANT
TRAY FOLEY IC TEMP SENS 16FR (CATHETERS) ×2 IMPLANT
TUBING INSUFFLATION (TUBING) ×4 IMPLANT
UNDERPAD 30X30 (UNDERPADS AND DIAPERS) ×4 IMPLANT
WATER STERILE IRR 1000ML POUR (IV SOLUTION) ×8 IMPLANT

## 2016-02-10 NOTE — Procedures (Signed)
Extubation Procedure Note  Patient Details:   Name: Carolyn Burns DOB: [birth year] MRN: [id number]   Airway Documentation:     Evaluation  O2 sats: stable throughout Complications: No apparent complications Patient did tolerate procedure well. Bilateral Breath Sounds: Clear, Diminished   Yes Pt extubated to humidified O2 via [place] at 4 L/m as per successful completion to rapid wean protocol. No stridor noted post extubation. Positive cuff leak. Sufficient respiratory mechanics measured: VC .550 L and NIF-20. Upon removal of ETT Pt is able to speak with no signs of labored breathing or adverse effects.  Carolyn Burns 02/10/2016, 10:23 PM

## 2016-02-10 NOTE — Brief Op Note (Signed)
01/31/2016 - 02/10/2016  11:54 AM  PATIENT:  Naomie DeanNancy Z Drumheller  75 y.o. female  PRE-OPERATIVE DIAGNOSIS:  CAD  POST-OPERATIVE DIAGNOSIS:  CAD  PROCEDURE:  Procedure(s): CORONARY ARTERY BYPASS GRAFTING (CABG)x with endoscopic harvesting of right saphenous vein (N/A) TRANSESOPHAGEAL ECHOCARDIOGRAM (TEE) (N/A)  SURGEON:  Surgeon(s) and Role:    * Kerin PernaPeter Van Trigt, MD - Primary  PHYSICIAN ASSISTANT:  Jari Favreessa Conte, PA-C  ANESTHESIA:   general  EBL:  Total I/O In: -  Out: 1305 [Urine:1305]  BLOOD ADMINISTERED: 2 units pRBC  DRAINS: routine   LOCAL MEDICATIONS USED:  NONE  SPECIMEN:  No Specimen  DISPOSITION OF SPECIMEN:  N/A  COUNTS:  YES  TOURNIQUET:  * No tourniquets in log *  DICTATION: .Other Dictation: Dictation Number pending  PLAN OF CARE: Admit to inpatient   PATIENT DISPOSITION:  ICU - intubated and hemodynamically stable.

## 2016-02-10 NOTE — Anesthesia Procedure Notes (Signed)
Procedures

## 2016-02-10 NOTE — Transfer of Care (Signed)
Immediate Anesthesia Transfer of Care Note  Patient: Carolyn Burns  Procedure(s) Performed: Procedure(s): CORONARY ARTERY BYPASS GRAFTING (CABG)x 3 with endoscopic harvesting of right saphenous vein (N/A) TRANSESOPHAGEAL ECHOCARDIOGRAM (TEE) (N/A)  Patient Location: SICU  Anesthesia Type:General  Level of Consciousness: sedated and unresponsive  Airway & Oxygen Therapy: Patient remains intubated per anesthesia plan and Patient placed on Ventilator (see vital sign flow sheet for setting)  Post-op Assessment: Report given to RN and Post -op Vital signs reviewed and stable  Post vital signs: Reviewed and stable  Last Vitals:  Vitals:   02/10/16 0404 02/10/16 0452  BP: (!) 99/58 102/60  Pulse: 71   Resp: 20   Temp: 37.4 C     Last Pain:  Vitals:   02/10/16 0404  TempSrc: Oral  PainSc:          Complications: No apparent anesthesia complications

## 2016-02-10 NOTE — Progress Notes (Signed)
RT note- placed back to full support, low minute ventilation.

## 2016-02-10 NOTE — Anesthesia Procedure Notes (Addendum)
Procedure Name: Intubation Date/Time: 02/10/2016 7:52 AM Performed by: Edmonia CaprioAUSTON, Aspyn Warnke M Pre-anesthesia Checklist: Patient identified, Emergency Drugs available, Suction available, Patient being monitored and Timeout performed Patient Re-evaluated:Patient Re-evaluated prior to inductionOxygen Delivery Method: Circle system utilized Preoxygenation: Pre-oxygenation with 100% oxygen Intubation Type: IV induction Ventilation: Mask ventilation without difficulty Laryngoscope Size: Miller and 2 Grade View: Grade I Tube type: Oral Tube size: 7.5 mm Number of attempts: 1 Airway Equipment and Method: Stylet Placement Confirmation: ETT inserted through vocal cords under direct vision,  positive ETCO2 and breath sounds checked- equal and bilateral Secured at: 23 cm Tube secured with: Tape Dental Injury: Teeth and Oropharynx as per pre-operative assessment

## 2016-02-10 NOTE — Progress Notes (Signed)
02/10/2016 Dr. Donata ClayVan Trigt at bedside and verbal order received not to initiate Rapid Wean Protocol until 5 pm as long as patient meets criteria at that time. Orders enacted. Will continue to closely monitor patient.  Esther Broyles, Blanchard KelchStephanie Ingold

## 2016-02-10 NOTE — Progress Notes (Signed)
RT note- VT decreased post ABG.

## 2016-02-10 NOTE — Progress Notes (Signed)
The patient was examined and preop studies reviewed. There has been no change from the prior exam and the patient is ready for surgery.  plan CABG on [address]

## 2016-02-10 NOTE — Progress Notes (Signed)
Patient ID: Carolyn DeanNancy Z Moncus, female   DOB: [birth year], 75 y.o.   MRN: [id number]   SICU Evening Rounds:   Hemodynamically stable, atrial paced at 86 CI = 2.3  Has started to wake up on vent.   Urine output good  CT output low  CBC    Component Value Date/Time   WBC 16.8 (H) 02/10/2016 1355   RBC 3.54 (L) 02/10/2016 1355   HGB 10.2 (L) 02/10/2016 1359   HCT 30.0 (L) 02/10/2016 1359   PLT 60 (L) 02/10/2016 1355   MCV 88.1 02/10/2016 1355   MCH 29.9 02/10/2016 1355   MCHC 34.0 02/10/2016 1355   RDW 12.0 02/10/2016 1355     BMET    Component Value Date/Time   NA 139 02/10/2016 1359   K 3.5 02/10/2016 1359   CL 99 (L) 02/10/2016 1238   CO2 24 02/10/2016 0223   GLUCOSE 121 (H) 02/10/2016 1359   BUN 7 02/10/2016 1238   CREATININE 0.40 (L) 02/10/2016 1238   CALCIUM 8.8 (L) 02/10/2016 0223   GFRNONAA >60 02/10/2016 0223   GFRAA >60 02/10/2016 0223     A/P:  Stable postop course. Continue current plans

## 2016-02-10 NOTE — Progress Notes (Signed)
  Echocardiogram Echocardiogram Transesophageal has been performed.  Shaine Newmark 02/10/2016, 9:15 AM

## 2016-02-10 NOTE — Anesthesia Postprocedure Evaluation (Signed)
Anesthesia Post Note  Patient: Carolyn Burns  Procedure(s) Performed: Procedure(s) (LRB): CORONARY ARTERY BYPASS GRAFTING (CABG)x 3 with endoscopic harvesting of right saphenous vein (N/A) TRANSESOPHAGEAL ECHOCARDIOGRAM (TEE) (N/A)  Patient location during evaluation: SICU Anesthesia Type: General Level of consciousness: sedated Pain management: pain level controlled Vital Signs Assessment: post-procedure vital signs reviewed and stable Respiratory status: patient remains intubated per anesthesia plan Cardiovascular status: stable Anesthetic complications: no    Last Vitals:  Vitals:   02/10/16 2225 02/10/16 2230  BP:  103/60  Pulse: 84 85  Resp: 20 19  Temp: (!) 38.6 C (!) 38.6 C    Last Pain:  Vitals:   02/10/16 2000  TempSrc: Core (Comment)  PainSc:                  Shelton SilvasKevin D Mohini Heathcock

## 2016-02-10 NOTE — Progress Notes (Signed)
RT note- Placed back to full support, ABG within parameter, however, NIF and FVC parameter not met.

## 2016-02-11 ENCOUNTER — Inpatient Hospital Stay (HOSPITAL_COMMUNITY): Payer: PPO

## 2016-02-11 ENCOUNTER — Encounter (HOSPITAL_COMMUNITY): Payer: Self-pay | Admitting: Cardiothoracic Surgery

## 2016-02-11 DIAGNOSIS — Z791 Long term (current) use of non-steroidal anti-inflammatories (NSAID): Secondary | ICD-10-CM | POA: Diagnosis not present

## 2016-02-11 DIAGNOSIS — Z79890 Hormone replacement therapy (postmenopausal): Secondary | ICD-10-CM | POA: Diagnosis not present

## 2016-02-11 DIAGNOSIS — K219 Gastro-esophageal reflux disease without esophagitis: Secondary | ICD-10-CM

## 2016-02-11 DIAGNOSIS — I5181 Takotsubo syndrome: Secondary | ICD-10-CM | POA: Diagnosis not present

## 2016-02-11 DIAGNOSIS — M1990 Unspecified osteoarthritis, unspecified site: Secondary | ICD-10-CM

## 2016-02-11 DIAGNOSIS — R918 Other nonspecific abnormal finding of lung field: Secondary | ICD-10-CM | POA: Diagnosis not present

## 2016-02-11 DIAGNOSIS — D62 Acute posthemorrhagic anemia: Secondary | ICD-10-CM | POA: Diagnosis not present

## 2016-02-11 DIAGNOSIS — D6959 Other secondary thrombocytopenia: Secondary | ICD-10-CM | POA: Diagnosis not present

## 2016-02-11 DIAGNOSIS — I25110 Atherosclerotic heart disease of native coronary artery with unstable angina pectoris: Secondary | ICD-10-CM | POA: Diagnosis not present

## 2016-02-11 DIAGNOSIS — D689 Coagulation defect, unspecified: Secondary | ICD-10-CM | POA: Diagnosis not present

## 2016-02-11 DIAGNOSIS — J9811 Atelectasis: Secondary | ICD-10-CM | POA: Diagnosis not present

## 2016-02-11 DIAGNOSIS — J90 Pleural effusion, not elsewhere classified: Secondary | ICD-10-CM | POA: Diagnosis not present

## 2016-02-11 DIAGNOSIS — I214 Non-ST elevation (NSTEMI) myocardial infarction: Secondary | ICD-10-CM | POA: Diagnosis not present

## 2016-02-11 DIAGNOSIS — E8770 Fluid overload, unspecified: Secondary | ICD-10-CM | POA: Diagnosis not present

## 2016-02-11 LAB — CBC
HCT: 28.6 % — ABNORMAL LOW (ref 36.0–46.0)
HCT: 30 % — ABNORMAL LOW (ref 36.0–46.0)
HEMOGLOBIN: 10 g/dL — AB (ref 12.0–15.0)
Hemoglobin: 10.1 g/dL — ABNORMAL LOW (ref 12.0–15.0)
MCH: 30.6 pg (ref 26.0–34.0)
MCH: 30.1 pg (ref 26.0–34.0)
MCHC: 35 g/dL (ref 30.0–36.0)
MCHC: 33.7 g/dL (ref 30.0–36.0)
MCV: 87.5 fL (ref 78.0–100.0)
MCV: 89.6 fL (ref 78.0–100.0)
PLATELETS: 97 10*3/uL — AB (ref 150–400)
Platelets: 99 10*3/uL — ABNORMAL LOW (ref 150–400)
RBC: 3.27 MIL/uL — ABNORMAL LOW (ref 3.87–5.11)
RBC: 3.35 MIL/uL — ABNORMAL LOW (ref 3.87–5.11)
RDW: 12.7 % (ref 11.5–15.5)
RDW: 12.9 % (ref 11.5–15.5)
WBC: 18.2 10*3/uL — ABNORMAL HIGH (ref 4.0–10.5)
WBC: 19 10*3/uL — AB (ref 4.0–10.5)

## 2016-02-11 LAB — PREPARE FRESH FROZEN PLASMA
UNIT DIVISION: 0
Unit division: 0

## 2016-02-11 LAB — GLUCOSE, CAPILLARY
GLUCOSE-CAPILLARY: 118 mg/dL — AB (ref 65–99)
GLUCOSE-CAPILLARY: 129 mg/dL — AB (ref 65–99)
GLUCOSE-CAPILLARY: 119 mg/dL — AB (ref 65–99)
GLUCOSE-CAPILLARY: 145 mg/dL — AB (ref 65–99)
GLUCOSE-CAPILLARY: 156 mg/dL — AB (ref 65–99)
Glucose-Capillary: 109 mg/dL — ABNORMAL HIGH (ref 65–99)
Glucose-Capillary: 113 mg/dL — ABNORMAL HIGH (ref 65–99)
Glucose-Capillary: 123 mg/dL — ABNORMAL HIGH (ref 65–99)
Glucose-Capillary: 124 mg/dL — ABNORMAL HIGH (ref 65–99)
Glucose-Capillary: 153 mg/dL — ABNORMAL HIGH (ref 65–99)
Glucose-Capillary: 163 mg/dL — ABNORMAL HIGH (ref 65–99)
Glucose-Capillary: 173 mg/dL — ABNORMAL HIGH (ref 65–99)
Glucose-Capillary: 86 mg/dL (ref 65–99)
Glucose-Capillary: 96 mg/dL (ref 65–99)

## 2016-02-11 LAB — BASIC METABOLIC PANEL
Anion gap: 9 (ref 5–15)
BUN: 8 mg/dL (ref 6–20)
CHLORIDE: 105 mmol/L (ref 101–111)
CO2: 22 mmol/L (ref 22–32)
CREATININE: 0.8 mg/dL (ref 0.44–1.00)
Calcium: 7.8 mg/dL — ABNORMAL LOW (ref 8.9–10.3)
GFR calc Af Amer: >60 mL/min (ref >60)
GFR calc non Af Amer: >60 mL/min (ref >60)
GLUCOSE: 123 mg/dL — AB (ref 65–99)
Potassium: 4 mmol/L (ref 3.5–5.1)
SODIUM: 136 mmol/L (ref 135–145)

## 2016-02-11 LAB — POCT I-STAT, CHEM 8
BUN: 11 mg/dL (ref 6–20)
CALCIUM ION: 1.13 mmol/L — AB (ref 1.15–1.40)
CHLORIDE: 100 mmol/L — AB (ref 101–111)
CREATININE: 0.7 mg/dL (ref 0.44–1.00)
GLUCOSE: 172 mg/dL — AB (ref 65–99)
HCT: 31 % — ABNORMAL LOW (ref 36.0–46.0)
Hemoglobin: 10.5 g/dL — ABNORMAL LOW (ref 12.0–15.0)
Potassium: 4.3 mmol/L (ref 3.5–5.1)
Sodium: 135 mmol/L (ref 135–145)
TCO2: 23 mmol/L (ref 0–100)

## 2016-02-11 LAB — CREATININE, SERUM: Creatinine, Ser: 0.81 mg/dL (ref 0.44–1.00)

## 2016-02-11 LAB — MAGNESIUM
MAGNESIUM: 2.5 mg/dL — AB (ref 1.7–2.4)
MAGNESIUM: 2.4 mg/dL (ref 1.7–2.4)

## 2016-02-11 LAB — PLATELET COUNT: Platelets: 91 10*3/uL — ABNORMAL LOW (ref 150–400)

## 2016-02-11 MED ORDER — FUROSEMIDE 10 MG/ML IJ SOLN
20.0000 mg | Freq: Two times a day (BID) | INTRAMUSCULAR | Status: AC
  Administered 2016-02-11 – 2016-02-12 (×4): 20 mg via INTRAVENOUS
  Filled 2016-02-11 (×4): qty 2

## 2016-02-11 MED ORDER — ASPIRIN 81 MG PO CHEW: 81.0000 mg | CHEWABLE_TABLET | Freq: Every day | ORAL | Status: DC

## 2016-02-11 MED ORDER — METOPROLOL TARTRATE 25 MG PO TABS: 25.0000 mg | ORAL_TABLET | Freq: Two times a day (BID) | ORAL | Status: DC

## 2016-02-11 MED ORDER — INSULIN ASPART 100 UNIT/ML ~~LOC~~ SOLN
0.0000 [IU] | SUBCUTANEOUS | Status: DC
  Administered 2016-02-11 (×2): 2 [IU] via SUBCUTANEOUS
  Administered 2016-02-11: 4 [IU] via SUBCUTANEOUS

## 2016-02-11 MED ORDER — METOPROLOL TARTRATE 25 MG/10 ML ORAL SUSPENSION: 12.5000 mg | Freq: Two times a day (BID) | ORAL | Status: DC

## 2016-02-11 MED ORDER — INSULIN DETEMIR 100 UNIT/ML ~~LOC~~ SOLN
8.0000 [IU] | Freq: Every day | SUBCUTANEOUS | Status: DC
  Administered 2016-02-11: 8 [IU] via SUBCUTANEOUS
  Filled 2016-02-11 (×2): qty 0.08

## 2016-02-11 MED ORDER — ASPIRIN EC 81 MG PO TBEC
81.0000 mg | DELAYED_RELEASE_TABLET | Freq: Every day | ORAL | Status: DC
  Administered 2016-02-12 – 2016-02-16 (×5): 81 mg via ORAL
  Filled 2016-02-11 (×5): qty 1

## 2016-02-11 MED ORDER — PROGESTERONE MICRONIZED 100 MG PO CAPS
100.0000 mg | ORAL_CAPSULE | Freq: Every day | ORAL | Status: DC
  Administered 2016-02-11 – 2016-02-16 (×6): 100 mg via ORAL
  Filled 2016-02-11 (×6): qty 1

## 2016-02-11 MED ORDER — POTASSIUM CHLORIDE 10 MEQ/50ML IV SOLN
10.0000 meq | INTRAVENOUS | Status: AC
  Administered 2016-02-11 (×2): 10 meq via INTRAVENOUS
  Filled 2016-02-11 (×2): qty 50

## 2016-02-11 MED ORDER — ESTRADIOL 1 MG PO TABS
0.5000 mg | ORAL_TABLET | Freq: Every day | ORAL | Status: DC
  Administered 2016-02-11 – 2016-02-16 (×6): 0.5 mg via ORAL
  Filled 2016-02-11 (×6): qty 0.5

## 2016-02-11 MED FILL — Thrombin For Soln 5000 Unit: CUTANEOUS | Qty: 5000 | Status: AC

## 2016-02-11 MED FILL — Sodium Chloride IV Soln 0.9%: INTRAVENOUS | Qty: 2000 | Status: AC

## 2016-02-11 MED FILL — Electrolyte-R (PH 7.4) Solution: INTRAVENOUS | Qty: 4000 | Status: AC

## 2016-02-11 MED FILL — Sodium Bicarbonate IV Soln 8.4%: INTRAVENOUS | Qty: 50 | Status: AC

## 2016-02-11 MED FILL — Mannitol IV Soln 20%: INTRAVENOUS | Qty: 500 | Status: AC

## 2016-02-11 MED FILL — Heparin Sodium (Porcine) Inj 1000 Unit/ML: INTRAMUSCULAR | Qty: 10 | Status: AC

## 2016-02-11 MED FILL — Lidocaine HCl IV Inj 20 MG/ML: INTRAVENOUS | Qty: 5 | Status: AC

## 2016-02-11 NOTE — Care Management Note (Signed)
Case Management Note  Patient Details  Name: Carolyn Burns MRN: [id number] Date of Birth: 06/15/1940  Subjective/Objective:   Pt lives with spouse who will be primary caregiver, daughter states she will also be staying with pt to assist with care.  Pt has needed no adaptive equipment in the home.                          Expected Discharge Plan:  Home/Self Care  Discharge planning Services  CM Consult  Status of Service:  In process, will continue to follow  Magdalene RiverMayo, Javis Abboud T, RN 02/11/2016, 12:59 PM

## 2016-02-11 NOTE — Care Management Important Message (Signed)
Important Message  Patient Details  Name: Carolyn Burns MRN: [id number] Date of Birth: [birth year]   Medicare Important Message Given:  Yes    Addis Bennie Abena 02/11/2016, 11:20 AM

## 2016-02-11 NOTE — Op Note (Signed)
Carolyn Burns, Carolyn Burns NO.:  [id number]  MEDICAL RECORD NO.:  [id number]  LOCATION:  2S03C                        FACILITY:  [facility]  PHYSICIAN:  [name], M.D.  DATE OF BIRTH:  02/26/1941  DATE OF PROCEDURE:  02/10/2016 DATE OF DISCHARGE:                              OPERATIVE REPORT   OPERATIONS: 1. Coronary artery bypass grafting x3 (left internal mammary artery to     left anterior descending artery, saphenous vein graft to obtuse     marginal 1, saphenous vein graft to obtuse marginal 2). 2. Endoscopic harvest of right leg greater saphenous vein.  SURGEON:  [name], M.D.  ASSISTANT:  Jari Favre, PA-C.  ANESTHESIA:  General by Dr.  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  POSTOPERATIVE DIAGNOSES:  Severe three-vessel coronary artery disease with left ventricular dysfunction, unstable angina and non-ST elevation myocardial infarction.  INDICATIONS:  The patient is a 75 year old female with history of chest pain, accelerating in intensity and frequency, who presented to the hospital with mildly elevated cardiac enzymes.  She underwent cardiac catheterization, which showed left dominant circulation with severe LAD and circumflex stenosis.  Her ejection fraction was initially 25-30% with elevated LVEDP.  She was felt to be a potential candidate for surgical revascularization.  I evaluated the patient, reviewed the cardiac catheterization and discussed the role of CABG for treatment of her CAD.  I felt that the patient would be at increased risk due to her small BSA and small vessel size, but that bypass surgery would be her best long-term therapy.  Her Cardiology team reviewed her echocardiogram, which was performed after the cath and felt that she could have an acute nonischemic cardiomyopathy and recommended  holding off on surgery to allow LV function to improve.  The patient remained stable without recurrent chest pain.  After 72 hours, a followup echo showed improvement of LV function to 45%.  So, the patient was scheduled for surgical revascularization and maintained on IV heparin.  I discussed the major aspects of the procedure with the patient including the use of general anesthesia and cardiopulmonary bypass, the location of the surgical incisions, and the expected postoperative hospital recovery.  I discussed with the patient the risks to her of coronary artery bypass surgery including the risk of stroke, bleeding, blood transfusion requirement, MI, postoperative pulmonary problems including pleural effusion, postoperative cardiac arrhythmias, infection, and death.  After reviewing these issues, she demonstrated her understanding and agreed to proceed with surgery under what I felt was an informed consent.  FINDINGS: 1. Small, but adequate conduit. 2. Small, but adequate targets.  The distal circumflex was less than 1     mm was too small to graft. 3. Intraoperative anemia, hemoglobin of 6.5, requiring blood     transfusion while on cardiopulmonary bypass.  OPERATIVE PROCEDURE:  The patient was brought to the operating room and placed supine on the operating room table where general anesthesia was induced under invasive hemodynamic monitoring.  A transesophageal echoprobe was placed by the anesthesiologist, Dr. Lyda Jester.  The patient was prepped and draped as a sterile field and a proper time-out  was performed.  The sternal incision was made and the saphenous vein was harvested endoscopically from the right leg.  The left internal mammary artery was harvested as a pedicle graft from its origin at the subclavian vessels.  It was 1.3-1.5-mm vessel with excellent flow.  The sternal retractor was placed and the pericardium was opened and suspended.  The aorta was inspected, palpated and  examined.  It had no significant calcified plaque.  Pursestrings were placed in the ascending aorta and right atrium, and heparin was administered.  When the ACT was documented as being therapeutic, the patient was cannulated and placed on bypass.  The coronaries were identified for grafting and the mammary artery and vein grafts were prepared for the distal anastomoses. Cardioplegia cannulas were placed for both antegrade and retrograde cold blood cardioplegia.  The patient was cooled to 32 degrees.  The aortic crossclamp was applied.  One liter of cold blood cardioplegia was delivered in split doses between the antegrade aortic and retrograde coronary sinus catheters.  There was good cardioplegic arrest and the septal temperature dropped less than 14 degrees.  Cardioplegia was delivered every 20 minutes.  The distal coronary anastomoses were performed.  The first distal anastomosis was to the OM2 branch of the circumflex.  This had a proximal 90% stenosis.  A reverse saphenous vein was sewn end-to-side with running 7-0 Prolene with good flow through the graft.  Cardioplegia was redosed.  The second distal anastomosis was to the OM1 branch of the left circumflex.  This had a proximal 90% stenosis.  A reverse saphenous vein was sewn end-to-side with running 7-0 Prolene with excellent flow through the graft.  Cardioplegia was redosed.  The third distal anastomosis was placed to the mid-LAD.  It was a small vessel, 1.2-1.4 mm in diameter.  The left IMA pedicle was brought through an opening and the left lateral pericardium was brought down onto the LAD and sewn end-to-side with running 8-0 Prolene.  There was excellent flow through the anastomosis with immediate rise in septal temperature after briefly releasing the bulldog pedicle clamp.  The bulldog pedicle clamp was reapplied and the pedicle was secured to the epicardium with 6-0 Prolenes.  Cardioplegia was redosed.  While the  crossclamp was still in place, two proximal vein anastomoses were performed on the ascending aorta using a 4.0-mm punch with running cycle 6-0 Prolene.  Prior to removing the crossclamp, air was vented from the coronaries with a dose of retrograde warm blood cardioplegia in the usual de-airing maneuvers.  The crossclamp was removed.  The heart resumed a spontaneous rhythm.  The vein grafts were de-aired and opened.  Each had good flow and hemostasis was documented at the proximal and distal anastomoses.  The patient was rewarmed and reperfused.  Temporary pacing wires were applied.  The lungs were expanded and ventilator was resumed.  The patient was placed on low-dose milrinone and prepare for separation from cardiopulmonary bypass.  The patient separated from cardiopulmonary bypass without difficulty with stable hemodynamics and transesophageal echo showing normal LV function now and no significant valvular abnormalities.  Protamine was administered without adverse reaction.  The cannulas were removed.  The heparin was reversed with protamine; however, there was still some diffuse coagulopathy and the patient was given FFP with improved coagulation function.  The mediastinum was irrigated with saline.  The superior pericardial fat was closed over the aorta.  The anterior mediastinal and left pleural chest tubes were placed and brought out through separate incisions.  The sternum was closed with interrupted steel wire.  The pectoralis fascia was closed with a running #1 Vicryl.  The subcutaneous and skin layers were closed using running Vicryl and sterile dressings were applied. Total cardiopulmonary bypass time was 112 minutes.     Kerin PernaPeter Van Trigt, M.D.     PV/MEDQ  D:  02/10/2016  T:  02/11/2016  Job:  [id number]  cc:   Rollene RotundaJames Hochrein, MD, [facility] BurdickFACC

## 2016-02-11 NOTE — Discharge Instructions (Signed)
Endoscopic Saphenous Vein Harvesting, Care After °Refer to this sheet in the next few weeks. These instructions provide you with information on caring for yourself after your procedure. Your health care provider may also give you more specific instructions. Your treatment has been planned according to current medical practices, but problems sometimes occur. Call your health care provider if you have any problems or questions after your procedure. °HOME CARE INSTRUCTIONS °Medicine °· Take whatever pain medicine your surgeon prescribes. Follow the directions carefully. Do not take over-the-counter pain medicine unless your surgeon says it is okay. Some pain medicine can cause bleeding problems for several weeks after surgery. °· Follow your surgeon's instructions about driving. You will probably not be permitted to drive after heart surgery. °· Take any medicines your surgeon prescribes. Any medicines you took before your heart surgery should be checked with your health care provider before you start taking them again. °Wound care °· If your surgeon has prescribed an elastic bandage or stocking, ask how long you should wear it. °· Check the area around your surgical cuts (incisions) whenever your bandages (dressings) are changed. Look for any redness or swelling. °· You will need to return to have the stitches (sutures) or staples taken out. Ask your surgeon when to do that. °· Ask your surgeon when you can shower or bathe. °Activity °· Try to keep your legs raised when you are sitting. °· Do any exercises your health care providers have given you. These may include deep breathing exercises, coughing, walking, or other exercises. °SEEK MEDICAL CARE IF: °· You have any questions about your medicines. °· You have more leg pain, especially if your pain medicine stops working. °· New or growing bruises develop on your leg. °· Your leg swells, feels tight, or becomes red. °· You have numbness in your leg. °SEEK IMMEDIATE  MEDICAL CARE IF: °· Your pain gets much worse. °· Blood or fluid leaks from any of the incisions. °· Your incisions become warm, swollen, or red. °· You have chest pain. °· You have trouble breathing. °· You have a fever. °· You have more pain near your leg incision. °MAKE SURE YOU: °· Understand these instructions. °· Will watch your condition. °· Will get help right away if you are not doing well or get worse. °  °This information is not intended to replace advice given to you by your health care provider. Make sure you discuss any questions you have with your health care provider. °  °Document Released: 02/01/2011 Document Revised: 06/12/2014 Document Reviewed: 02/01/2011 °Elsevier Interactive Patient Education ©2016 [employer] °Coronary Artery Bypass Grafting, Care After °These instructions give you information on caring for yourself after your procedure. Your doctor may also give you more specific instructions. Call your doctor if you have any problems or questions after your procedure.  °HOME CARE °· Only take medicine as told by your doctor. Take medicines exactly as told. Do not stop taking medicines or start any new medicines without talking to your doctor first. °· Take your pulse as told by your doctor. °· Do deep breathing as told by your doctor. Use your breathing device (incentive spirometer), if given, to practice deep breathing several times a day. Support your chest with a pillow or your arms when you take deep breaths or cough. °· Keep the area clean, dry, and protected where the surgery cuts (incisions) were made. Remove bandages (dressings) only as told by your doctor. If strips were applied to surgical area, do not take   them off. They fall off on their own. °· Check the surgery area daily for puffiness (swelling), redness, or leaking fluid. °· If surgery cuts were made in your legs: °· Avoid crossing your legs. °· Avoid sitting for long periods of time. Change positions every 30  minutes. °· Raise your legs when you are sitting. Place them on pillows. °· Wear stockings that help keep blood clots from forming in your legs (compression stockings). °· Only take sponge baths until your doctor says it is okay to take showers. Pat the surgery area dry. Do not rub the surgery area with a washcloth or towel. Do not bathe, swim, or use a hot tub until your doctor says it is okay. °· Eat foods that are high in fiber. These include raw fruits and vegetables, whole grains, beans, and nuts. Choose lean meats. Avoid canned, processed, and fried foods. °· Drink enough fluids to keep your pee (urine) clear or pale yellow. °· Weigh yourself every day. °· Rest and limit activity as told by your doctor. You may be told to: °· Stop any activity if you have chest pain, shortness of breath, changes in heartbeat, or dizziness. Get help right away if this happens. °· Move around often for short amounts of time or take short walks as told by your doctor. Gradually become more active. You may need help to strengthen your muscles and build endurance. °· Avoid lifting, pushing, or pulling anything heavier than 10 pounds (4.5 kg) for at least 6 weeks after surgery. °· Do not drive until your doctor says it is okay. °· Ask your doctor when you can go back to work. °· Ask your doctor when you can begin sexual activity again. °· Follow up with your doctor as told. °GET HELP IF: °· You have puffiness, redness, more pain, or fluid draining from the incision site. °· You have a fever. °· You have puffiness in your ankles or legs. °· You have pain in your legs. °· You gain 2 or more pounds (0.9 kg) a day. °· You feel sick to your stomach (nauseous) or throw up (vomit). °· You have watery poop (diarrhea). °GET HELP RIGHT AWAY IF: °· You have chest pain that goes to your jaw or arms. °· You have shortness of breath. °· You have a fast or irregular heartbeat. °· You notice a "clicking" in your breastbone when you move. °· You  have numbness or weakness in your arms or legs. °· You feel dizzy or light-headed. °MAKE SURE YOU: °· Understand these instructions. °· Will watch your condition. °· Will get help right away if you are not doing well or get worse. °  °This information is not intended to replace advice given to you by your health care provider. Make sure you discuss any questions you have with your health care provider. °  °Document Released: 05/27/2013 Document Reviewed: 05/27/2013 °Elsevier Interactive Patient Education ©2016 [employer] ° °

## 2016-02-11 NOTE — Progress Notes (Signed)
1 Day Post-Op Procedure(s) (LRB): CORONARY ARTERY BYPASS GRAFTING (CABG)x 3 with endoscopic harvesting of right saphenous vein (N/A) TRANSESOPHAGEAL ECHOCARDIOGRAM (TEE) (N/A) Subjective: Stable after CABG preop anteroseptal MI NSTEMI Will start low dose lisinopril later postop NSR, extubated  Objective: Vital signs in last 24 hours: Temp:  [96.6 F (35.9 C)-101.5 F (38.6 C)] 98.6 F (37 C) (09/08 0845) Pulse Rate:  [73-85] 84 (09/08 0845) Cardiac Rhythm: Atrial paced (09/08 0800) Resp:  [11-33] 14 (09/08 0845) BP: (86-131)/(46-79) 109/61 (09/08 0845) SpO2:  [90 %-100 %] 100 % (09/08 0845) Arterial Line BP: (98-190)/(41-127) 135/55 (09/08 0845) FiO2 (%):  [40 %-50 %] 40 % (09/07 2115) Weight:  [124 lb 9 oz (56.5 kg)] 124 lb 9 oz (56.5 kg) (09/08 0500)  Hemodynamic parameters for last 24 hours: PAP: (15-62)/(6-37) 31/11 CO:  [1.9 L/min-3.8 L/min] 3.8 L/min CI:  [1.3 L/min/m2-2.6 L/min/m2] 2.6 L/min/m2  Intake/Output from previous day: 09/07 0701 - 09/08 0700 In: 5697.4 [I.V.:3733.4; Blood:904; NG/GT:60; IV Piggyback:1000] Out: 5637 [Urine:4220; Emesis/NG output:130; Blood:700; Chest Tube:587] Intake/Output this shift: Total I/O In: 44.6 [I.V.:44.6] Out: 30 [Urine:30]       Exam    General- alert and comfortable   Lungs- clear without rales, wheezes   Cor- regular rate and rhythm, no murmur , gallop   Abdomen- soft, non-tender   Extremities - warm, non-tender, minimal edema   Neuro- oriented, appropriate, no focal weakness   Lab Results:  Recent Labs  02/10/16 1945 02/10/16 2115 02/11/16 0418  WBC 16.1*  --  18.2*  HGB 10.2* 8.8* 10.0*  HCT 29.1* 26.0* 28.6*  PLT 84*  --  99*   BMET:  Recent Labs  02/10/16 0223  02/10/16 2115 02/11/16 0418  NA 137  < > 139 136  K 3.6  < > 4.2 4.0  CL 106  < > 103 105  CO2 24  --   --  22  GLUCOSE 123*  < > 183* 123*  BUN 10  < > 8 8  CREATININE 0.83  < > 0.70 0.80  CALCIUM 8.8*  --   --  7.8*  < > = values in  this interval not displayed.  PT/INR:  Recent Labs  02/10/16 1355  LABPROT 18.3*  INR 1.50   ABG    Component Value Date/Time   PHART 7.409 02/10/2016 2327   HCO3 23.5 02/10/2016 2327   TCO2 25 02/10/2016 2327   ACIDBASEDEF 1.0 02/10/2016 2327   O2SAT 100.0 02/10/2016 2327   CBG (last 3)   Recent Labs  02/11/16 0632 02/11/16 0736 02/11/16 0837  GLUCAP 119* 124* 113*    Assessment/Plan: S/P Procedure(s) (LRB): CORONARY ARTERY BYPASS GRAFTING (CABG)x 3 with endoscopic harvesting of right saphenous vein (N/A) TRANSESOPHAGEAL ECHOCARDIOGRAM (TEE) (N/A) Mobilize Diuresis Diabetes control follow elevated WBC w/o fever   LOS: 11 days    Carolyn Burns 02/11/2016

## 2016-02-11 NOTE — Progress Notes (Signed)
[facility] BRIEF SICU PROGRESS NOTE  1 Day Post-Op  S/P Procedure(s) (LRB): CORONARY ARTERY BYPASS GRAFTING (CABG)x 3 with endoscopic harvesting of right saphenous vein (N/A) TRANSESOPHAGEAL ECHOCARDIOGRAM (TEE) (N/A)   Stable day NSR w/ stable BP Breathing comfortably w/ O2 sats 95% on 2 L/min UOP adequate  Plan: Continue routine plan  Purcell Nailslarence H Owen, MD 02/11/2016 6:00 PM

## 2016-02-11 NOTE — Discharge Summary (Signed)
Physician Discharge Summary  Patient ID: Carolyn Burns MRN: [id number] DOB/AGE: Feb 22, 1941 75 y.o.  Admit date: 01/31/2016 Discharge date: 02/16/2016  Admission Diagnoses:NSTEMI (non-ST elevated myocardial infarction) [facility](HCC)  Discharge Diagnoses:  Active Problems:   NSTEMI (non-ST elevated myocardial infarction) (HCC)   Osteoarthritis   GERD (gastroesophageal reflux disease)  Patient Active Problem List   Diagnosis Date Noted  . Osteoarthritis 02/11/2016  . GERD (gastroesophageal reflux disease) 02/11/2016  . NSTEMI (non-ST elevated myocardial infarction) (HCC) 01/31/2016    Pt is a 75yo female with PMH significant only for OA and GERD who presented to the [facility] ED with SSCP. Carolyn Burns had been in her normal state of health until one week ago when she experienced acute onset "sharp" SSCP while ambulating in her kitchen. This episode lasted for approximately 30 minutes and was associated with nausea and mild SOB prior to self-resolving. Since that time she has consistently experienced milder episodes of SSCP and fatigue while ambulating which then are relieved with rest, prompting her to present to her PCP for evaluation today. An ambulatory troponin was drawn and found to be elevated at 0.36 and the Carolyn Burns was subsequently asked to present to the ED for evaluation. After arrival to [facility] she was chest pain free and hemodynamically stable. Repeat troponin was 0.17. Given a concern for NSTEMI the pt was given a full dose ASA and started on treatment dose Enoxaparin. She was then transferred to [facility] for further evaluation and management. On arrival here she had remained CP free and had no other acute complaints.   She underwent cardiac catheterization on 02/01/2016 and was found to have the following lesions:   LM lesion, 75 %stenosed.  Ost LAD lesion, 70 %stenosed.  Prox LAD to Mid LAD lesion, 80 %stenosed.  LPDA lesion, 70 %stenosed.  Ost Cx to  Prox Cx lesion, 50 %stenosed.  Prox Cx lesion, 70 %stenosed.  1st Mrg lesion, 90 %stenosed.  There is severe left ventricular systolic dysfunction.  LV end diastolic pressure is moderately elevated  Due to these findings cardiothoracic surgical consultation was obtained and she was felt to be a candidate for coronary artery surgical revascularization.   Discharged Condition: good  Hospital Course: The patient was admitted in transfer as described above. She was scheduled for surgery and on 02/10/2016 taken to the operating room where she underwent the below described procedure. She tolerated well was taken to the surgical intensive care unit in stable condition.  She was extubated the evening of surgery.  Her chest tubes and arterial lines were removed without difficulty.  She was maintaining NSR with some PVCs, and was felt stable for transfer to the telemetry unit in stable condition.  The patient continued to make progress.  She continued to have PVCs and she was placed on Lopressor and Cardizem.  However, cardizem is contraindicated in her state and was removed.  Her lopressor was titrated as her HR and ectopy allowed.  She developed leukocytosis without signs, symptoms on infection.  UA was also obtained and was negative for infection.  Her temporary pacing wires were removed without difficulty.  She was ambulating without difficulty.  Cardiology evaluated patient and cleared patient for discharge home with close outpatient follow up.  She is medically stable for discharge home today.         Consults: cardiology   Treatments: surgery:  DATE OF PROCEDURE:  02/10/2016 DATE OF DISCHARGE:  OPERATIVE REPORT   OPERATIONS: 1. Coronary artery bypass grafting x3 (left internal mammary artery to     left anterior descending artery, saphenous vein graft to obtuse     marginal 1, saphenous vein graft to obtuse marginal 2). 2. Endoscopic harvest of right leg  greater saphenous vein.  SURGEON:  [name], M.D.  ASSISTANT:  Jari Favre, PA-C.  ANESTHESIA:  General by Dr.  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  POSTOPERATIVE DIAGNOSES:  Severe three-vessel coronary artery disease with left ventricular dysfunction, unstable angina and non-ST elevation myocardial infarction.   Disposition: Home  Discharge medications:  The patient has been discharged on:   1.Beta Blocker:  Yes [ x  ]                              No   [   ]                              If No, reason:  2.Ace Inhibitor/ARB: Yes [ x  ]                                     No  [    ]                                     If No, reason:  3.Statin:   Yes [ x  ]                  No  [   ]                  If No, reason:  4.Ecasa:  Yes  [ x  ]                  No   [   ]                  If No, reason:     Medication List    TAKE these medications   aspirin 81 MG EC tablet Take 1 tablet (81 mg total) by mouth daily. Start taking on:  02/17/2016   atorvastatin 40 MG tablet Commonly known as:  LIPITOR Take 1 tablet (40 mg total) by mouth daily at 6 PM.   celecoxib 200 MG capsule Commonly known as:  CELEBREX Take 200 mg by mouth daily.   cholecalciferol 1000 units tablet Commonly known as:  VITAMIN D Take 2,000 Units by mouth 2 (two) times daily.   estradiol 0.5 MG tablet Commonly known as:  ESTRACE Take 0.5 mg by mouth daily.   Krill Oil 1000 MG Caps Take 1,000 mg by mouth daily.   lisinopril 2.5 MG tablet Commonly known as:  PRINIVIL,ZESTRIL Take 1 tablet (2.5 mg total) by mouth daily. Start taking on:  02/17/2016   metoprolol 100 MG tablet Commonly known as:  LOPRESSOR Take 1 tablet (100 mg total) by mouth 2 (two) times daily.   progesterone 100 MG capsule Commonly known as:  PROMETRIUM Take 100 mg by mouth  daily.   ranitidine 150 MG tablet Commonly known as:  ZANTAC Take  150 mg by mouth daily.   traMADol 50 MG tablet Commonly known as:  ULTRAM Take 1-2 tablets (50-100 mg total) by mouth every 4 (four) hours as needed for moderate pain.       Discharge Instructions    AMB Referral to [facility] Care Management    Complete by:  As directed    Reason for consult:  Post hospital monitoring - HTA member new diagnosis and medications   Expected date of contact:  1-3 days (reserved for hospital discharges)   Please assign to community nurse for transition of care calls and assess for home visits. Questions please call:   Charlesetta Shanks, RN BSN CCM [facility]  [phone] business mobile phone Toll free office [phone]   Amb Referral to Cardiac Rehabilitation    Complete by:  As directed    Diagnosis:   CABG NSTEMI     CABG X ___:  3       Follow-up Information    Kathlee Nations Trigt III, MD Follow up on 03/08/2016.   Specialty:  Cardiothoracic Surgery Why:  Appoitnment is at 1:30, please get CXR at 1:00 at [facility] imaging located on first floor our of office building Contact information: [address] [place] [address] [phone]        [facility] .   Why:  HHRN arranged- they will call you to set up home visits Contact information: [address] [place] [address] [phone]        Corine Shelter, PA-C Follow up on 03/01/2016.   Specialties:  Cardiology, Radiology Why:  9:00 am (Dr [facility]) Contact information: [address] [place] [address] [phone]          The patient has been discharged on:   1.Beta Blocker:  Yes [place].Etienne   ]                              No   [   ]                              If No, reason:  2.Ace Inhibitor/ARB: Yes [   ]                                     No  [    ]                                     If No, reason:  3.Statin:   Yes [  y  ]                  No  [   ]                  If No, reason:  4.Ecasa:  Yes  [ y  ]                  No   [   ]                  If No, reason:  Signed: Glennis Montenegro 02/16/2016, 11:11 AM

## 2016-02-12 ENCOUNTER — Inpatient Hospital Stay (HOSPITAL_COMMUNITY): Payer: PPO

## 2016-02-12 DIAGNOSIS — R918 Other nonspecific abnormal finding of lung field: Secondary | ICD-10-CM | POA: Diagnosis not present

## 2016-02-12 DIAGNOSIS — Z951 Presence of aortocoronary bypass graft: Secondary | ICD-10-CM | POA: Diagnosis not present

## 2016-02-12 LAB — CBC
HCT: 26.8 % — ABNORMAL LOW (ref 36.0–46.0)
Hemoglobin: 9.1 g/dL — ABNORMAL LOW (ref 12.0–15.0)
MCH: 30.5 pg (ref 26.0–34.0)
MCHC: 34 g/dL (ref 30.0–36.0)
MCV: 89.9 fL (ref 78.0–100.0)
Platelets: 80 10*3/uL — ABNORMAL LOW (ref 150–400)
RBC: 2.98 MIL/uL — ABNORMAL LOW (ref 3.87–5.11)
RDW: 12.9 % (ref 11.5–15.5)
WBC: 13.1 10*3/uL — ABNORMAL HIGH (ref 4.0–10.5)

## 2016-02-12 LAB — GLUCOSE, CAPILLARY
Glucose-Capillary: 107 mg/dL — ABNORMAL HIGH (ref 65–99)
Glucose-Capillary: 122 mg/dL — ABNORMAL HIGH (ref 65–99)
Glucose-Capillary: 140 mg/dL — ABNORMAL HIGH (ref 65–99)
Glucose-Capillary: 141 mg/dL — ABNORMAL HIGH (ref 65–99)
Glucose-Capillary: 95 mg/dL (ref 65–99)
Glucose-Capillary: 96 mg/dL (ref 65–99)

## 2016-02-12 LAB — BASIC METABOLIC PANEL
Anion gap: 4 — ABNORMAL LOW (ref 5–15)
BUN: 11 mg/dL (ref 6–20)
CO2: 28 mmol/L (ref 22–32)
Calcium: 8 mg/dL — ABNORMAL LOW (ref 8.9–10.3)
Chloride: 105 mmol/L (ref 101–111)
Creatinine, Ser: 0.67 mg/dL (ref 0.44–1.00)
GFR calc Af Amer: >60 mL/min (ref >60)
GFR calc non Af Amer: >60 mL/min (ref >60)
Glucose, Bld: 92 mg/dL (ref 65–99)
Potassium: 4.2 mmol/L (ref 3.5–5.1)
Sodium: 137 mmol/L (ref 135–145)

## 2016-02-12 MED ORDER — INSULIN ASPART 100 UNIT/ML ~~LOC~~ SOLN
0.0000 [IU] | Freq: Three times a day (TID) | SUBCUTANEOUS | Status: DC
  Administered 2016-02-12 – 2016-02-15 (×11): 2 [IU] via SUBCUTANEOUS
  Administered 2016-02-15 (×2): 4 [IU] via SUBCUTANEOUS
  Administered 2016-02-16: 2 [IU] via SUBCUTANEOUS

## 2016-02-12 MED ORDER — SODIUM CHLORIDE 0.9 % IV SOLN: 250.0000 mL | INTRAVENOUS | Status: DC | PRN

## 2016-02-12 MED ORDER — SODIUM CHLORIDE 0.9% FLUSH
3.0000 mL | Freq: Two times a day (BID) | INTRAVENOUS | Status: DC
  Administered 2016-02-12 – 2016-02-15 (×4): 3 mL via INTRAVENOUS

## 2016-02-12 MED ORDER — MORPHINE SULFATE (PF) 2 MG/ML IV SOLN
1.0000 mg | INTRAVENOUS | Status: DC | PRN
  Administered 2016-02-12: 1 mg via INTRAVENOUS

## 2016-02-12 MED ORDER — SODIUM CHLORIDE 0.9% FLUSH: 3.0000 mL | INTRAVENOUS | Status: DC | PRN

## 2016-02-12 MED ORDER — FUROSEMIDE 40 MG PO TABS
40.0000 mg | ORAL_TABLET | Freq: Every day | ORAL | Status: DC
  Administered 2016-02-13: 40 mg via ORAL
  Filled 2016-02-12: qty 1

## 2016-02-12 MED ORDER — MOVING RIGHT ALONG BOOK
Freq: Once | Status: AC
  Administered 2016-02-12: 10:00:00
  Filled 2016-02-12: qty 1

## 2016-02-12 MED ORDER — METOPROLOL TARTRATE 12.5 MG HALF TABLET
12.5000 mg | ORAL_TABLET | Freq: Two times a day (BID) | ORAL | Status: DC
  Administered 2016-02-12: 12.5 mg via ORAL
  Filled 2016-02-12: qty 1

## 2016-02-12 MED ORDER — FUROSEMIDE 20 MG PO TABS: 20.0000 mg | ORAL_TABLET | Freq: Every day | ORAL | Status: DC

## 2016-02-12 NOTE — Progress Notes (Signed)
Pt transferred via w/c to 2W22; chart, pt belongings, SCD's along for transfer; RN and NT in room upon transfer.  Benjamine SpragueYates, Shyniece Scripter A

## 2016-02-12 NOTE — Progress Notes (Signed)
Report called to Rosey Batheresa, RN on 2W; pt to transfer to 2W22; daughter Kennith Centerracey called to make aware of transfer; will transport pt via wheelchair; will cont. To monitor.  Benjamine SpragueYates, Gordy Goar A

## 2016-02-12 NOTE — Progress Notes (Signed)
[address]       [place] [address]             [phone]        CARDIOTHORACIC SURGERY PROGRESS NOTE   R2 Days Post-Op Procedure(s) (LRB): CORONARY ARTERY BYPASS GRAFTING (CABG)x 3 with endoscopic harvesting of right saphenous vein (N/A) TRANSESOPHAGEAL ECHOCARDIOGRAM (TEE) (N/A)  Subjective: Looks good and feels well.  Minimal soreness in chest.  No SOB  Objective: Vital signs: BP Readings from Last 1 Encounters:  02/12/16 (!) 91/57   Pulse Readings from Last 1 Encounters:  02/12/16 79   Resp Readings from Last 1 Encounters:  02/12/16 (!) 21   Temp Readings from Last 1 Encounters:  02/12/16 99 F (37.2 C) (Oral)    Hemodynamics: PAP: (28-36)/(11-17) 35/17  Physical Exam:  Rhythm:   sinus  Breath sounds: clear  Heart sounds:  RRR  Incisions:  Dressing dry, intact  Abdomen:  Soft, non-distended, non-tender  Extremities:  Warm, well-perfused  Chest tubes:  Low volume thin serosanguinous output, no air leak    Intake/Output from previous day: 09/08 0701 - 09/09 0700 In: 676.3 [P.O.:40; I.V.:536.3; IV Piggyback:100] Out: 1595 [Urine:1305; Chest Tube:290] Intake/Output this shift: Total I/O In: 220 [P.O.:180; I.V.:40] Out: 585 [Urine:565; Chest Tube:20]  Lab Results:  CBC: Recent Labs  02/11/16 1833 02/12/16 0440  WBC 19.0* 13.1*  HGB 10.1* 9.1*  HCT 30.0* 26.8*  PLT 97* 80*    BMET:  Recent Labs  02/11/16 0418 02/11/16 1814 02/11/16 1833 02/12/16 0440  NA 136 135  --  137  K 4.0 4.3  --  4.2  CL 105 100*  --  105  CO2 22  --   --  28  GLUCOSE 123* 172*  --  92  BUN 8 11  --  11  CREATININE 0.80 0.70 0.81 0.67  CALCIUM 7.8*  --   --  8.0*     PT/INR:   Recent Labs  02/10/16 1355  LABPROT 18.3*  INR 1.50    CBG (last 3)   Recent Labs  02/12/16 0016 02/12/16 0433 02/12/16 0749  GLUCAP 95 96 107*    ABG    Component Value Date/Time   PHART 7.409 02/10/2016 2327   PCO2ART 37.1 02/10/2016 2327    PO2ART 169.0 (H) 02/10/2016 2327   HCO3 23.5 02/10/2016 2327   TCO2 23 02/11/2016 1814   ACIDBASEDEF 1.0 02/10/2016 2327   O2SAT 100.0 02/10/2016 2327    CXR: PORTABLE CHEST 1 VIEW  COMPARISON:  Radiograph of February 11, 2016.  FINDINGS: Stable cardiomegaly. Status post coronary artery bypass graft. Theone Murdoch catheter has been removed. Right internal jugular venous sheath remains. No pneumothorax is noted. Left-sided chest tube is unchanged in position without evidence of pneumothorax. Stable mild bibasilar opacities are noted concerning for atelectasis and effusion. Bony thorax is unremarkable.  IMPRESSION: Stable position of left-sided chest tube without pneumothorax. Stable bibasilar opacities as described above.   Electronically Signed   By: Lupita Raider, M.D.   On: 02/12/2016 09:25   Assessment/Plan: S/P Procedure(s) (LRB): CORONARY ARTERY BYPASS GRAFTING (CABG)x 3 with endoscopic harvesting of right saphenous vein (N/A) TRANSESOPHAGEAL ECHOCARDIOGRAM (TEE) (N/A)  Doing well POD2 Maintaining NSR w/ stable BP off drips, BP 90-110 systolic Breathing comfortably w/ O2 sats 98% on 2 L/min Expected post op acute blood loss anemia, Hgb down slightly 9.1 Expected post op atelectasis, mild Expected post op volume excess, weight 8-10 lbs above  baseline Post op thrombocytopenia, platelet count down slightly 80k Pre-op anteroseptal MI   Mobilize  D/C tubes  Gentle diuresis  Decrease metoprolol dose and watch BP  Hold ACE-I for now  Watch anemia, platelet count  Transfer step down   Purcell Nailslarence H Owen, MD 02/12/2016 9:40 AM

## 2016-02-13 ENCOUNTER — Inpatient Hospital Stay (HOSPITAL_COMMUNITY): Payer: PPO

## 2016-02-13 DIAGNOSIS — Z951 Presence of aortocoronary bypass graft: Secondary | ICD-10-CM | POA: Diagnosis not present

## 2016-02-13 DIAGNOSIS — J9811 Atelectasis: Secondary | ICD-10-CM | POA: Diagnosis not present

## 2016-02-13 LAB — BASIC METABOLIC PANEL
ANION GAP: 6 (ref 5–15)
BUN: 13 mg/dL (ref 6–20)
CALCIUM: 8.3 mg/dL — AB (ref 8.9–10.3)
CO2: 29 mmol/L (ref 22–32)
Chloride: 103 mmol/L (ref 101–111)
Creatinine, Ser: 0.75 mg/dL (ref 0.44–1.00)
Glucose, Bld: 111 mg/dL — ABNORMAL HIGH (ref 65–99)
Potassium: 3.4 mmol/L — ABNORMAL LOW (ref 3.5–5.1)
SODIUM: 138 mmol/L (ref 135–145)

## 2016-02-13 LAB — TYPE AND SCREEN
ABO/RH(D): A NEG
Antibody Screen: NEGATIVE
Unit division: 0
Unit division: 0
Unit division: 0
Unit division: 0

## 2016-02-13 LAB — CBC
HEMATOCRIT: 30.4 % — AB (ref 36.0–46.0)
Hemoglobin: 9.8 g/dL — ABNORMAL LOW (ref 12.0–15.0)
MCH: 29.9 pg (ref 26.0–34.0)
MCHC: 32.2 g/dL (ref 30.0–36.0)
MCV: 92.7 fL (ref 78.0–100.0)
PLATELETS: 155 10*3/uL (ref 150–400)
RBC: 3.28 MIL/uL — ABNORMAL LOW (ref 3.87–5.11)
RDW: 12.7 % (ref 11.5–15.5)
WBC: 14.6 10*3/uL — AB (ref 4.0–10.5)

## 2016-02-13 LAB — GLUCOSE, CAPILLARY
GLUCOSE-CAPILLARY: 119 mg/dL — AB (ref 65–99)
GLUCOSE-CAPILLARY: 135 mg/dL — AB (ref 65–99)
Glucose-Capillary: 121 mg/dL — ABNORMAL HIGH (ref 65–99)
Glucose-Capillary: 146 mg/dL — ABNORMAL HIGH (ref 65–99)

## 2016-02-13 MED ORDER — LISINOPRIL 2.5 MG PO TABS
2.5000 mg | ORAL_TABLET | Freq: Every day | ORAL | Status: DC
  Administered 2016-02-13 – 2016-02-16 (×4): 2.5 mg via ORAL
  Filled 2016-02-13 (×4): qty 1

## 2016-02-13 MED ORDER — POTASSIUM CHLORIDE CRYS ER 20 MEQ PO TBCR
40.0000 meq | EXTENDED_RELEASE_TABLET | Freq: Every day | ORAL | Status: DC
  Administered 2016-02-13: 40 meq via ORAL
  Filled 2016-02-13: qty 2

## 2016-02-13 MED ORDER — METOPROLOL TARTRATE 25 MG PO TABS
25.0000 mg | ORAL_TABLET | Freq: Two times a day (BID) | ORAL | Status: DC
  Administered 2016-02-13 – 2016-02-14 (×3): 25 mg via ORAL
  Filled 2016-02-13 (×3): qty 1

## 2016-02-13 NOTE — Progress Notes (Addendum)
[address]       [place] [address]             [phone]      3 Days Post-Op Procedure(s) (LRB): CORONARY ARTERY BYPASS GRAFTING (CABG)x 3 with endoscopic harvesting of right saphenous vein (N/A) TRANSESOPHAGEAL ECHOCARDIOGRAM (TEE) (N/A) Subjective: Feels fair, some diarrhea  Objective: Vital signs in last 24 hours: Temp:  [97.8 F (36.6 C)-99.3 F (37.4 C)] 98.4 F (36.9 C) (09/10 0414) Pulse Rate:  [79-100] 94 (09/10 0414) Cardiac Rhythm: Normal sinus rhythm (09/09 1900) Resp:  [16-30] 18 (09/10 0414) BP: (91-132)/(52-72) 127/56 (09/10 0414) SpO2:  [92 %-98 %] 92 % (09/10 0414) Weight:  [116 lb 10 oz (52.9 kg)] 116 lb 10 oz (52.9 kg) (09/10 0414)  Hemodynamic parameters for last 24 hours:    Intake/Output from previous day: 09/09 0701 - 09/10 0700 In: 495.7 [P.O.:440; I.V.:55.7] Out: 1526 [Urine:1505; Stool:1; Chest Tube:20] Intake/Output this shift: No intake/output data recorded.  General appearance: alert, cooperative and no distress Heart: regular rate and rhythm Lungs: clear to auscultation bilaterally Abdomen: benign Extremities: no edema Wound: EVH incis healing well , chest dressing intact  Lab Results:  Recent Labs  02/12/16 0440 02/13/16 0413  WBC 13.1* 14.6*  HGB 9.1* 9.8*  HCT 26.8* 30.4*  PLT 80* 155   BMET:  Recent Labs  02/12/16 0440 02/13/16 0413  NA 137 138  K 4.2 3.4*  CL 105 103  CO2 28 29  GLUCOSE 92 111*  BUN 11 13  CREATININE 0.67 0.75  CALCIUM 8.0* 8.3*    PT/INR:  Recent Labs  02/10/16 1355  LABPROT 18.3*  INR 1.50   ABG    Component Value Date/Time   PHART 7.409 02/10/2016 2327   HCO3 23.5 02/10/2016 2327   TCO2 23 02/11/2016 1814   ACIDBASEDEF 1.0 02/10/2016 2327   O2SAT 100.0 02/10/2016 2327   CBG (last 3)   Recent Labs  02/12/16 1713 02/12/16 2128 02/13/16 0607  GLUCAP 122* 140* 119*    Meds Scheduled Meds: . acetaminophen  1,000 mg Oral Q6H  . aspirin EC  81 mg  Oral Daily  . atorvastatin  40 mg Oral q1800  . bisacodyl  10 mg Oral Daily   Or  . bisacodyl  10 mg Rectal Daily  . chlorhexidine  15 mL Mouth Rinse BID  . docusate sodium  200 mg Oral Daily  . estradiol  0.5 mg Oral Daily  . furosemide  40 mg Oral Daily  . insulin aspart  0-24 Units Subcutaneous TID AC & HS  . mouth rinse  15 mL Mouth Rinse QID  . metoCLOPramide (REGLAN) injection  10 mg Intravenous Q6H  . metoprolol tartrate  12.5 mg Oral BID  . pantoprazole  40 mg Oral Daily  . progesterone  100 mg Oral Daily  . sodium chloride flush  3 mL Intravenous Q12H  . sodium chloride flush  3 mL Intravenous Q12H   Continuous Infusions:  PRN Meds:.sodium chloride, metoprolol, morphine injection, ondansetron (ZOFRAN) IV, oxyCODONE, sodium chloride flush, sodium chloride flush, traMADol  Xrays Dg Chest 2 View  Result Date: 02/13/2016 CLINICAL DATA:  Status post CABG. EXAM: CHEST  2 VIEW COMPARISON:  02/12/2016. FINDINGS: Right IJ sheath and left chest tube have been removed in the interval. The midline mediastinal/ pericardial drain has been pulled. There is a tiny right-sided pneumothorax evident. The cardio pericardial silhouette is enlarged. Bibasilar atelectasis noted. Telemetry leads overlie the chest.  IMPRESSION: 1. Tiny right apical pneumothorax is new in the interval. 2. Interval removal of support apparatus. 3. Bibasilar atelectasis. Electronically Signed   By: Kennith CenterEric  Mansell M.D.   On: 02/13/2016 07:52   Dg Chest Port 1 View  Result Date: 02/12/2016 CLINICAL DATA:  Status post coronary artery bypass graft. EXAM: PORTABLE CHEST 1 VIEW COMPARISON:  Radiograph of February 11, 2016. FINDINGS: Stable cardiomegaly. Status post coronary artery bypass graft. Theone MurdochSwan Ganz catheter has been removed. Right internal jugular venous sheath remains. No pneumothorax is noted. Left-sided chest tube is unchanged in position without evidence of pneumothorax. Stable mild bibasilar opacities are noted concerning  for atelectasis and effusion. Bony thorax is unremarkable. IMPRESSION: Stable position of left-sided chest tube without pneumothorax. Stable bibasilar opacities as described above. Electronically Signed   By: Lupita RaiderJames  Green Jr, M.D.   On: 02/12/2016 09:25    Assessment/Plan: S/P Procedure(s) (LRB): CORONARY ARTERY BYPASS GRAFTING (CABG)x 3 with endoscopic harvesting of right saphenous vein (N/A) TRANSESOPHAGEAL ECHOCARDIOGRAM (TEE) (N/A)  1 doing well overall 2 sinus but frequent PVC's, replace K+ and increase beta blocker dose  3 cont gentle diuresis for some volume overload. WT up about 4 # if accurate 4 blood sugars mildly elevated- no DM preop, HGB A1C 5.8 preop 6 H/H improved with diuresis, Platelets improved. 7 cont routine pulm toilet 8 BP well controlled but should tol a small dose of ACE inhib 9 hold stool softeners/ laxatives, regalan  LOS: 13 days    GOLD,WAYNE E 02/13/2016  I have seen and examined the patient and agree with the assessment and plan as outlined.  Making good progress.  Purcell Nailslarence H Syrai Gladwin, MD 02/13/2016 11:15 AM

## 2016-02-14 DIAGNOSIS — R000 Tachycardia, unspecified: Secondary | ICD-10-CM | POA: Diagnosis not present

## 2016-02-14 DIAGNOSIS — I2510 Atherosclerotic heart disease of native coronary artery without angina pectoris: Secondary | ICD-10-CM | POA: Diagnosis not present

## 2016-02-14 DIAGNOSIS — I493 Ventricular premature depolarization: Secondary | ICD-10-CM

## 2016-02-14 DIAGNOSIS — I472 Ventricular tachycardia: Secondary | ICD-10-CM | POA: Diagnosis not present

## 2016-02-14 DIAGNOSIS — I214 Non-ST elevation (NSTEMI) myocardial infarction: Secondary | ICD-10-CM | POA: Diagnosis not present

## 2016-02-14 DIAGNOSIS — I10 Essential (primary) hypertension: Secondary | ICD-10-CM | POA: Diagnosis not present

## 2016-02-14 LAB — GLUCOSE, CAPILLARY
GLUCOSE-CAPILLARY: 123 mg/dL — AB (ref 65–99)
GLUCOSE-CAPILLARY: 149 mg/dL — AB (ref 65–99)
Glucose-Capillary: 120 mg/dL — ABNORMAL HIGH (ref 65–99)
Glucose-Capillary: 140 mg/dL — ABNORMAL HIGH (ref 65–99)

## 2016-02-14 LAB — URINE MICROSCOPIC-ADD ON: Bacteria, UA: NONE SEEN

## 2016-02-14 LAB — BASIC METABOLIC PANEL
Anion gap: 10 (ref 5–15)
BUN: 13 mg/dL (ref 6–20)
CHLORIDE: 107 mmol/L (ref 101–111)
CO2: 24 mmol/L (ref 22–32)
CREATININE: 0.7 mg/dL (ref 0.44–1.00)
Calcium: 8.5 mg/dL — ABNORMAL LOW (ref 8.9–10.3)
GFR calc Af Amer: >60 mL/min (ref >60)
GFR calc non Af Amer: >60 mL/min (ref >60)
GLUCOSE: 132 mg/dL — AB (ref 65–99)
POTASSIUM: 3.7 mmol/L (ref 3.5–5.1)
SODIUM: 141 mmol/L (ref 135–145)

## 2016-02-14 LAB — URINALYSIS, ROUTINE W REFLEX MICROSCOPIC
Bilirubin Urine: NEGATIVE
GLUCOSE, UA: 100 mg/dL — AB
Hgb urine dipstick: NEGATIVE
KETONES UR: 15 mg/dL — AB
LEUKOCYTES UA: NEGATIVE
NITRITE: NEGATIVE
PH: 7.5 (ref 5.0–8.0)
Protein, ur: 30 mg/dL — AB
Specific Gravity, Urine: 1.028 (ref 1.005–1.030)

## 2016-02-14 MED ORDER — POTASSIUM CHLORIDE CRYS ER 20 MEQ PO TBCR
40.0000 meq | EXTENDED_RELEASE_TABLET | Freq: Once | ORAL | Status: AC
  Administered 2016-02-14: 40 meq via ORAL
  Filled 2016-02-14: qty 2

## 2016-02-14 MED ORDER — METOPROLOL TARTRATE 25 MG PO TABS
25.0000 mg | ORAL_TABLET | Freq: Once | ORAL | Status: AC
  Administered 2016-02-14: 25 mg via ORAL
  Filled 2016-02-14: qty 1

## 2016-02-14 MED ORDER — ENSURE ENLIVE PO LIQD
237.0000 mL | Freq: Two times a day (BID) | ORAL | Status: DC
  Administered 2016-02-15 (×2): 237 mL via ORAL

## 2016-02-14 MED ORDER — METOPROLOL TARTRATE 50 MG PO TABS
50.0000 mg | ORAL_TABLET | Freq: Two times a day (BID) | ORAL | Status: DC
  Administered 2016-02-14 – 2016-02-15 (×2): 50 mg via ORAL
  Filled 2016-02-14 (×2): qty 1

## 2016-02-14 MED ORDER — DILTIAZEM HCL ER 60 MG PO CP12
60.0000 mg | ORAL_CAPSULE | Freq: Two times a day (BID) | ORAL | Status: DC
  Administered 2016-02-14 (×2): 60 mg via ORAL
  Filled 2016-02-14 (×3): qty 1

## 2016-02-14 NOTE — Care Management Important Message (Signed)
Important Message  Patient Details  Name: Carolyn Burns MRN: [id number] Date of Birth: Nov 05, 1940   Medicare Important Message Given:  Yes    Dorena BodoIris Liesel Peckenpaugh 02/14/2016, 1:42 PM

## 2016-02-14 NOTE — Progress Notes (Signed)
Patient Name: Carolyn Burns Date of Encounter: 02/14/2016  Active Problems:   NSTEMI (non-ST elevated myocardial infarction) (HCC)   Osteoarthritis   GERD (gastroesophageal reflux disease)   Length of Stay: 14  SUBJECTIVE  Minimal chest pain at the sternotomy site with motion, otherwise she feels well.   CURRENT MEDS . acetaminophen  1,000 mg Oral Q6H  . aspirin EC  81 mg Oral Daily  . atorvastatin  40 mg Oral q1800  . chlorhexidine  15 mL Mouth Rinse BID  . diltiazem  60 mg Oral Q12H  . estradiol  0.5 mg Oral Daily  . insulin aspart  0-24 Units Subcutaneous TID AC & HS  . lisinopril  2.5 mg Oral Daily  . mouth rinse  15 mL Mouth Rinse QID  . metoprolol tartrate  25 mg Oral BID  . pantoprazole  40 mg Oral Daily  . potassium chloride  40 mEq Oral Once  . progesterone  100 mg Oral Daily  . sodium chloride flush  3 mL Intravenous Q12H  . sodium chloride flush  3 mL Intravenous Q12H   OBJECTIVE  Vitals:   02/13/16 1458 02/13/16 1856 02/13/16 2141 02/14/16 0536  BP:   129/67 124/69  Pulse:   (!) 105 (!) 104  Resp:   18 18  Temp: 99.7 F (37.6 C) 99.6 F (37.6 C) 99.4 F (37.4 C) 98.3 F (36.8 C)  TempSrc: Oral Oral Oral Oral  SpO2:   93% 95%  Weight:    109 lb 9.6 oz (49.7 kg)  Height:        Intake/Output Summary (Last 24 hours) at 02/14/16 1028 Last data filed at 02/14/16 0815  Gross per 24 hour  Intake              480 ml  Output             1850 ml  Net            -1370 ml   Filed Weights   02/12/16 0500 02/13/16 0414 02/14/16 0536  Weight: 122 lb (55.3 kg) 116 lb 10 oz (52.9 kg) 109 lb 9.6 oz (49.7 kg)   PHYSICAL EXAM  General: Pleasant, NAD. Neuro: Alert and oriented X 3. Moves all extremities spontaneously. Psych: Normal affect. HEENT:  Normal  Neck: Supple without bruits or JVD. Lungs:  Resp regular and unlabored, CTA. S/P sternotomy Heart: RRR no s3, s4, or murmurs. Abdomen: Soft, non-tender, non-distended, BS + x 4.  Extremities:  No clubbing, cyanosis or edema. DP/PT/Radials 2+ and equal bilaterally.  Accessory Clinical Findings  CBC  Recent Labs  02/12/16 0440 02/13/16 0413  WBC 13.1* 14.6*  HGB 9.1* 9.8*  HCT 26.8* 30.4*  MCV 89.9 92.7  PLT 80* 155   Basic Metabolic Panel  Recent Labs  02/11/16 1833  02/13/16 0413 02/14/16 0314  NA  --   < > 138 141  K  --   < > 3.4* 3.7  CL  --   < > 103 107  CO2  --   < > 29 24  GLUCOSE  --   < > 111* 132*  BUN  --   < > 13 13  CREATININE 0.81  < > 0.75 0.70  CALCIUM  --   < > 8.3* 8.5*  MG 2.4  --   --   --   < > = values in this interval not displayed.  Radiology/Studies  Dg Chest 2 View  Result Date:  02/13/2016 CLINICAL DATA:  Status post CABG. EXAM: CHEST  2 VIEW COMPARISON:  02/12/2016. FINDINGS: Right IJ sheath and left chest tube have been removed in the interval. The midline mediastinal/ pericardial drain has been pulled. There is a tiny right-sided pneumothorax evident. The cardio pericardial silhouette is enlarged. Bibasilar atelectasis noted. Telemetry leads overlie the chest. IMPRESSION: 1. Tiny right apical pneumothorax is new in the interval. 2. Interval removal of support apparatus. 3. Bibasilar atelectasis.   TELE:     ASSESSMENT AND PLAN  1. CAD, s/p CABG x 3 on 9/7, on ASA, atorvastatin, low dose lisinopril and metoprolol  2. LV dysfunction - 40-45% on lisinopril and metoprolol, at this point she is not fluid overloaded and rather dry with mild fever, agree with holding lasix  3. Very frequent PVCs in a pattern of trigeminy, couplets and one ns VT - 3 beats total, I would increase metoprolol to 50 mg p BID   Signed, Tobias Alexander MD, [facility] 02/14/2016

## 2016-02-14 NOTE — Progress Notes (Signed)
Patient concerned about irregular heart beats.  I explained medication changes made by MD and monitoring of heart rate/rhythm.  Patient states she may be noticing it more since doctor told her about it.  She did feel fast HR earlier but now not sure what she feels. Pt resting with call bell within reach.  Will continue to monitor.

## 2016-02-14 NOTE — Progress Notes (Signed)
CARDIAC REHAB PHASE I   PRE:  Rate/Rhythm: 102 ST  BP:  Supine: 139/63  Sitting:   Standing:    SaO2: 98%RA  MODE:  Ambulation: 350 ft   POST:  Rate/Rhythm: 125 ST PVCs  BP:  Supine:   Sitting: 140/82  Standing:    SaO2: 96%RA 0935-0955 Pt walked 350 ft on RA with rolling walker and minimal asst. Reminded of sternal precautions. Did not feel like going farther. C/o feeling tired. To bed after walk. Will walk with staff or family later.   Carolyn Nuttingharlene Kavya Haag, RN BSN  02/14/2016 9:52 AM

## 2016-02-14 NOTE — Progress Notes (Addendum)
[address]       [place] [address]             [phone]      4 Days Post-Op Procedure(s) (LRB): CORONARY ARTERY BYPASS GRAFTING (CABG)x 3 with endoscopic harvesting of right saphenous vein (N/A) TRANSESOPHAGEAL ECHOCARDIOGRAM (TEE) (N/A) Subjective: Feeling better, ambulation conts to improve  Objective: Vital signs in last 24 hours: Temp:  [98.3 F (36.8 C)-100.1 F (37.8 C)] 98.3 F (36.8 C) (09/11 0536) Pulse Rate:  [18-105] 104 (09/11 0536) Cardiac Rhythm: Normal sinus rhythm (09/11 0700) Resp:  [18-19] 18 (09/11 0536) BP: (108-129)/(58-69) 124/69 (09/11 0536) SpO2:  [93 %-100 %] 95 % (09/11 0536) Weight:  [109 lb 9.6 oz (49.7 kg)] 109 lb 9.6 oz (49.7 kg) (09/11 0536)  Hemodynamic parameters for last 24 hours:    Intake/Output from previous day: 09/10 0701 - 09/11 0700 In: 240 [P.O.:240] Out: 2100 [Urine:2100] Intake/Output this shift: No intake/output data recorded.  General appearance: alert, cooperative and no distress Heart: regular rate and rhythm Lungs: clear to auscultation bilaterally Abdomen: benign Extremities: no edema Wound: incis healing well  Lab Results:  Recent Labs  02/12/16 0440 02/13/16 0413  WBC 13.1* 14.6*  HGB 9.1* 9.8*  HCT 26.8* 30.4*  PLT 80* 155   BMET:  Recent Labs  02/13/16 0413 02/14/16 0314  NA 138 141  K 3.4* 3.7  CL 103 107  CO2 29 24  GLUCOSE 111* 132*  BUN 13 13  CREATININE 0.75 0.70  CALCIUM 8.3* 8.5*    PT/INR: No results for input(s): LABPROT, INR in the last 72 hours. ABG    Component Value Date/Time   PHART 7.409 02/10/2016 2327   HCO3 23.5 02/10/2016 2327   TCO2 23 02/11/2016 1814   ACIDBASEDEF 1.0 02/10/2016 2327   O2SAT 100.0 02/10/2016 2327   CBG (last 3)   Recent Labs  02/13/16 1612 02/13/16 2218 02/14/16 0642  GLUCAP 121* 146* 123*    Meds Scheduled Meds: . acetaminophen  1,000 mg Oral Q6H  . aspirin EC  81 mg Oral Daily  . atorvastatin  40 mg Oral  q1800  . chlorhexidine  15 mL Mouth Rinse BID  . estradiol  0.5 mg Oral Daily  . furosemide  40 mg Oral Daily  . insulin aspart  0-24 Units Subcutaneous TID AC & HS  . lisinopril  2.5 mg Oral Daily  . mouth rinse  15 mL Mouth Rinse QID  . metoprolol tartrate  25 mg Oral BID  . pantoprazole  40 mg Oral Daily  . potassium chloride  40 mEq Oral Daily  . progesterone  100 mg Oral Daily  . sodium chloride flush  3 mL Intravenous Q12H  . sodium chloride flush  3 mL Intravenous Q12H   Continuous Infusions:  PRN Meds:.sodium chloride, metoprolol, morphine injection, ondansetron (ZOFRAN) IV, oxyCODONE, sodium chloride flush, sodium chloride flush, traMADol  Xrays Dg Chest 2 View  Result Date: 02/13/2016 CLINICAL DATA:  Status post CABG. EXAM: CHEST  2 VIEW COMPARISON:  02/12/2016. FINDINGS: Right IJ sheath and left chest tube have been removed in the interval. The midline mediastinal/ pericardial drain has been pulled. There is a tiny right-sided pneumothorax evident. The cardio pericardial silhouette is enlarged. Bibasilar atelectasis noted. Telemetry leads overlie the chest. IMPRESSION: 1. Tiny right apical pneumothorax is new in the interval. 2. Interval removal of support apparatus. 3. Bibasilar atelectasis. Electronically Signed   By: Jamison Oka.D.  On: 02/13/2016 07:52    Assessment/Plan: S/P Procedure(s) (LRB): CORONARY ARTERY BYPASS GRAFTING (CABG)x 3 with endoscopic harvesting of right saphenous vein (N/A) TRANSESOPHAGEAL ECHOCARDIOGRAM (TEE) (N/A)  1 conts to progress 2 low grade temp- will check UA as WBC was increased - no sx 3 frequent PVC's cont to replace K+- no VTACH, she does have some LV Dysfunction, cont beta blocker 4 D/C epw's today 5 good diuresis- she is below preop wt- will stop diuretic  6 poss d/c tomoprrow  LOS: 14 days    GOLD,WAYNE E 02/14/2016  needs better HR control Will add po cardizem patient examined and medical record reviewed,agree with above  note. Kathlee Nationseter Van Trigt III 02/14/2016

## 2016-02-15 DIAGNOSIS — I214 Non-ST elevation (NSTEMI) myocardial infarction: Secondary | ICD-10-CM | POA: Diagnosis not present

## 2016-02-15 DIAGNOSIS — I493 Ventricular premature depolarization: Secondary | ICD-10-CM | POA: Diagnosis not present

## 2016-02-15 DIAGNOSIS — I472 Ventricular tachycardia: Secondary | ICD-10-CM

## 2016-02-15 DIAGNOSIS — I10 Essential (primary) hypertension: Secondary | ICD-10-CM | POA: Diagnosis not present

## 2016-02-15 DIAGNOSIS — R000 Tachycardia, unspecified: Secondary | ICD-10-CM | POA: Diagnosis not present

## 2016-02-15 DIAGNOSIS — I2510 Atherosclerotic heart disease of native coronary artery without angina pectoris: Secondary | ICD-10-CM | POA: Diagnosis not present

## 2016-02-15 LAB — GLUCOSE, CAPILLARY
GLUCOSE-CAPILLARY: 182 mg/dL — AB (ref 65–99)
GLUCOSE-CAPILLARY: 161 mg/dL — AB (ref 65–99)
Glucose-Capillary: 146 mg/dL — ABNORMAL HIGH (ref 65–99)
Glucose-Capillary: 141 mg/dL — ABNORMAL HIGH (ref 65–99)

## 2016-02-15 LAB — BASIC METABOLIC PANEL
Anion gap: 10 (ref 5–15)
BUN: 11 mg/dL (ref 6–20)
CALCIUM: 8.8 mg/dL — AB (ref 8.9–10.3)
CO2: 22 mmol/L (ref 22–32)
Chloride: 106 mmol/L (ref 101–111)
Creatinine, Ser: 0.67 mg/dL (ref 0.44–1.00)
GFR calc non Af Amer: >60 mL/min (ref >60)
Glucose, Bld: 125 mg/dL — ABNORMAL HIGH (ref 65–99)
Potassium: 4.2 mmol/L (ref 3.5–5.1)
Sodium: 138 mmol/L (ref 135–145)

## 2016-02-15 LAB — URINALYSIS, ROUTINE W REFLEX MICROSCOPIC
Bilirubin Urine: NEGATIVE
Glucose, UA: NEGATIVE mg/dL
Hgb urine dipstick: NEGATIVE
Ketones, ur: NEGATIVE mg/dL
Leukocytes, UA: NEGATIVE
Nitrite: NEGATIVE
Protein, ur: NEGATIVE mg/dL
Specific Gravity, Urine: 1.027 (ref 1.005–1.030)
pH: 7.5 (ref 5.0–8.0)

## 2016-02-15 LAB — CBC
HEMATOCRIT: 31.3 % — AB (ref 36.0–46.0)
Hemoglobin: 10.1 g/dL — ABNORMAL LOW (ref 12.0–15.0)
MCH: 29.7 pg (ref 26.0–34.0)
MCHC: 32.3 g/dL (ref 30.0–36.0)
MCV: 92.1 fL (ref 78.0–100.0)
Platelets: 308 10*3/uL (ref 150–400)
RBC: 3.4 MIL/uL — ABNORMAL LOW (ref 3.87–5.11)
RDW: 13 % (ref 11.5–15.5)
WBC: 16.6 10*3/uL — ABNORMAL HIGH (ref 4.0–10.5)

## 2016-02-15 MED ORDER — DIPHENHYDRAMINE HCL 25 MG PO CAPS
25.0000 mg | ORAL_CAPSULE | Freq: Four times a day (QID) | ORAL | Status: DC | PRN
  Administered 2016-02-15: 25 mg via ORAL
  Filled 2016-02-15: qty 1

## 2016-02-15 MED ORDER — METOPROLOL TARTRATE 50 MG PO TABS
75.0000 mg | ORAL_TABLET | Freq: Two times a day (BID) | ORAL | Status: DC
  Administered 2016-02-15: 75 mg via ORAL
  Filled 2016-02-15 (×2): qty 1

## 2016-02-15 NOTE — Progress Notes (Signed)
CARDIAC REHAB PHASE I   PRE:  Rate/Rhythm: 80 SR PVCs  BP:  Supine:   Sitting: 120/74  Standing:    SaO2: 97%RA  MODE:  Ambulation: 450 ft   POST:  Rate/Rhythm: 94 SR PVCs  BP:  Supine:   Sitting: 116/69  Standing:    SaO2: 99%RA 1045-1128 Pt walked 450 ft on RA with rolling walker and has walker at home if needed. Hand held asst. Gait steady. Needs reminding of sternal precautions. To recliner after walk. Pt stated there is possibility that she may go home later so I completed ed with her. Put on discharge video to view. Reviewed sternal precautions, IS and ex ed. Discussed CRP 2 and will refer to [place].   Carolyn Nuttingharlene Lanea Vankirk, RN BSN  02/15/2016 11:24 AM

## 2016-02-15 NOTE — Progress Notes (Signed)
Patient Name: Carolyn Burns Date of Encounter: 02/15/2016  Active Problems:   NSTEMI (non-ST elevated myocardial infarction) (HCC)   Osteoarthritis   GERD (gastroesophageal reflux disease)   Length of Stay: 15  SUBJECTIVE  No chest pain, significant shortness of breath, she tried to walk only feels tired.  CURRENT MEDS . acetaminophen  1,000 mg Oral Q6H  . aspirin EC  81 mg Oral Daily  . atorvastatin  40 mg Oral q1800  . chlorhexidine  15 mL Mouth Rinse BID  . estradiol  0.5 mg Oral Daily  . feeding supplement (ENSURE ENLIVE)  237 mL Oral BID BM  . insulin aspart  0-24 Units Subcutaneous TID AC & HS  . lisinopril  2.5 mg Oral Daily  . mouth rinse  15 mL Mouth Rinse QID  . metoprolol tartrate  50 mg Oral BID  . pantoprazole  40 mg Oral Daily  . progesterone  100 mg Oral Daily  . sodium chloride flush  3 mL Intravenous Q12H  . sodium chloride flush  3 mL Intravenous Q12H   OBJECTIVE  Vitals:   02/14/16 1400 02/14/16 2125 02/15/16 0513 02/15/16 0900  BP: 131/71 (!) 148/67 124/71 (!) 117/101  Pulse: 79 98 85 94  Resp:  18 18   Temp: 98.5 F (36.9 C) 99.9 F (37.7 C) 98.3 F (36.8 C)   TempSrc: Oral Oral Oral   SpO2: 98% 98% 96%   Weight:   109 lb 6.4 oz (49.6 kg)   Height:        Intake/Output Summary (Last 24 hours) at 02/15/16 1200 Last data filed at 02/15/16 0900  Gross per 24 hour  Intake              480 ml  Output             1100 ml  Net             -620 ml   Filed Weights   02/13/16 0414 02/14/16 0536 02/15/16 0513  Weight: 116 lb 10 oz (52.9 kg) 109 lb 9.6 oz (49.7 kg) 109 lb 6.4 oz (49.6 kg)   PHYSICAL EXAM  General: Pleasant, NAD. Neuro: Alert and oriented X 3. Moves all extremities spontaneously. Psych: Normal affect. HEENT:  Normal  Neck: Supple without bruits or JVD. Lungs:  Resp regular and unlabored, CTA. S/P sternotomy Heart: RRR no s3, s4, or murmurs. Abdomen: Soft, non-tender, non-distended, BS + x 4.  Extremities: No  clubbing, cyanosis or edema. DP/PT/Radials 2+ and equal bilaterally.  Accessory Clinical Findings  CBC  Recent Labs  02/13/16 0413 02/15/16 0345  WBC 14.6* 16.6*  HGB 9.8* 10.1*  HCT 30.4* 31.3*  MCV 92.7 92.1  PLT 155 308   Basic Metabolic Panel  Recent Labs  02/14/16 0314 02/15/16 0345  NA 141 138  K 3.7 4.2  CL 107 106  CO2 24 22  GLUCOSE 132* 125*  BUN 13 11  CREATININE 0.70 0.67  CALCIUM 8.5* 8.8*    Radiology/Studies  Dg Chest 2 View  Result Date: 02/13/2016 CLINICAL DATA:  Status post CABG. EXAM: CHEST  2 VIEW COMPARISON:  02/12/2016. FINDINGS: Right IJ sheath and left chest tube have been removed in the interval. The midline mediastinal/ pericardial drain has been pulled. There is a tiny right-sided pneumothorax evident. The cardio pericardial silhouette is enlarged. Bibasilar atelectasis noted. Telemetry leads overlie the chest. IMPRESSION: 1. Tiny right apical pneumothorax is new in the interval. 2. Interval removal of support  apparatus. 3. Bibasilar atelectasis.   TELE:     ASSESSMENT AND PLAN  1. CAD, s/p CABG x 3 on 9/7, on ASA, atorvastatin, low dose lisinopril and metoprolol  2. LV dysfunction - 40-45% on lisinopril and metoprolol, at this point she is not fluid overloaded and rather dry with mild fever, agree with holding lasix, I would repeat her echocardiogram for evaluation of LVEF.   3. Very frequent PVCs in a pattern of trigeminy, couplets and one ns VT - 3 beats total, I wouldFurther increase metoprolol to 75 mg p BID, I agree with discontinuation of Cardizem.   Signed, Tobias AlexanderKatarina Kinshas[place] Throckmorton MD, [facility] 02/15/2016

## 2016-02-15 NOTE — Progress Notes (Addendum)
[address]       [place] [id number]             [phone]      5 Days Post-Op Procedure(s) (LRB): CORONARY ARTERY BYPASS GRAFTING (CABG)x 3 with endoscopic harvesting of right saphenous vein (N/A) TRANSESOPHAGEAL ECHOCARDIOGRAM (TEE) (N/A) Subjective: Feels pretty well , minor cough  Objective: Vital signs in last 24 hours: Temp:  [98.3 F (36.8 C)-99.9 F (37.7 C)] 98.3 F (36.8 C) (09/12 0513) Pulse Rate:  [79-108] 85 (09/12 0513) Cardiac Rhythm: Normal sinus rhythm (09/11 1900) Resp:  [18] 18 (09/12 0513) BP: (102-148)/(51-82) 124/71 (09/12 0513) SpO2:  [96 %-98 %] 96 % (09/12 0513) Weight:  [109 lb 6.4 oz (49.6 kg)] 109 lb 6.4 oz (49.6 kg) (09/12 0513)  Hemodynamic parameters for last 24 hours:    Intake/Output from previous day: 09/11 0701 - 09/12 0700 In: 720 [P.O.:720] Out: 900 [Urine:900] Intake/Output this shift: No intake/output data recorded.  General appearance: alert, cooperative and no distress Heart: regular rate and rhythm Lungs: clear to auscultation bilaterally Abdomen: benign Extremities: benign Wound: incis healing well  Lab Results:  Recent Labs  02/13/16 0413 02/15/16 0345  WBC 14.6* 16.6*  HGB 9.8* 10.1*  HCT 30.4* 31.3*  PLT 155 308   BMET:  Recent Labs  02/14/16 0314 02/15/16 0345  NA 141 138  K 3.7 4.2  CL 107 106  CO2 24 22  GLUCOSE 132* 125*  BUN 13 11  CREATININE 0.70 0.67  CALCIUM 8.5* 8.8*    PT/INR: No results for input(s): LABPROT, INR in the last 72 hours. ABG    Component Value Date/Time   PHART 7.409 02/10/2016 2327   HCO3 23.5 02/10/2016 2327   TCO2 23 02/11/2016 1814   ACIDBASEDEF 1.0 02/10/2016 2327   O2SAT 100.0 02/10/2016 2327   CBG (last 3)   Recent Labs  02/14/16 1624 02/14/16 2121 02/15/16 0634  GLUCAP 149* 140* 146*    Meds Scheduled Meds: . acetaminophen  1,000 mg Oral Q6H  . aspirin EC  81 mg Oral Daily  . atorvastatin  40 mg Oral q1800  .  chlorhexidine  15 mL Mouth Rinse BID  . diltiazem  60 mg Oral Q12H  . estradiol  0.5 mg Oral Daily  . feeding supplement (ENSURE ENLIVE)  237 mL Oral BID BM  . insulin aspart  0-24 Units Subcutaneous TID AC & HS  . lisinopril  2.5 mg Oral Daily  . mouth rinse  15 mL Mouth Rinse QID  . metoprolol tartrate  50 mg Oral BID  . pantoprazole  40 mg Oral Daily  . progesterone  100 mg Oral Daily  . sodium chloride flush  3 mL Intravenous Q12H  . sodium chloride flush  3 mL Intravenous Q12H   Continuous Infusions:  PRN Meds:.sodium chloride, metoprolol, morphine injection, ondansetron (ZOFRAN) IV, oxyCODONE, sodium chloride flush, sodium chloride flush, traMADol  Xrays No results found.  Assessment/Plan: S/P Procedure(s) (LRB): CORONARY ARTERY BYPASS GRAFTING (CABG)x 3 with endoscopic harvesting of right saphenous vein (N/A) TRANSESOPHAGEAL ECHOCARDIOGRAM (TEE) (N/A)  1 doing well 2 frequent pvc's persist, on cardizem and lopressor, will need to monitor closely with EFx 25% on preop cath . EFX on echo is 40-45 % so unsure where she stands at this point.  This is a significant contra-indication for cardizem 3 low grade temp, increasing leukocytosis- no obvious source. Monitor, check Urine culture 4 UA- spilling ketones and glucose. Glucose mildly elevated,  HgB A!C 5.8- will need to limit dietary carbs/sugars as outpatient- could consider glucophage   LOS: 15 days    GOLD,WAYNE E 02/15/2016  WBC 16 k- check for prob UTI and recheck WBC tomorrow Sinus tach better with increased lopressor HR < 90, EF .45 , stop   cardizem avoid amiodarone for freq PVCs due to nausea

## 2016-02-16 ENCOUNTER — Inpatient Hospital Stay (HOSPITAL_COMMUNITY): Payer: PPO

## 2016-02-16 DIAGNOSIS — I255 Ischemic cardiomyopathy: Secondary | ICD-10-CM | POA: Diagnosis not present

## 2016-02-16 DIAGNOSIS — E440 Moderate protein-calorie malnutrition: Secondary | ICD-10-CM | POA: Insufficient documentation

## 2016-02-16 DIAGNOSIS — R000 Tachycardia, unspecified: Secondary | ICD-10-CM | POA: Diagnosis not present

## 2016-02-16 DIAGNOSIS — I2510 Atherosclerotic heart disease of native coronary artery without angina pectoris: Secondary | ICD-10-CM | POA: Diagnosis not present

## 2016-02-16 DIAGNOSIS — I214 Non-ST elevation (NSTEMI) myocardial infarction: Secondary | ICD-10-CM | POA: Diagnosis not present

## 2016-02-16 DIAGNOSIS — I493 Ventricular premature depolarization: Secondary | ICD-10-CM | POA: Diagnosis not present

## 2016-02-16 DIAGNOSIS — I472 Ventricular tachycardia: Secondary | ICD-10-CM | POA: Diagnosis not present

## 2016-02-16 DIAGNOSIS — I10 Essential (primary) hypertension: Secondary | ICD-10-CM | POA: Diagnosis not present

## 2016-02-16 LAB — CBC
HCT: 34.8 % — ABNORMAL LOW (ref 36.0–46.0)
Hemoglobin: 11.4 g/dL — ABNORMAL LOW (ref 12.0–15.0)
MCH: 30.2 pg (ref 26.0–34.0)
MCHC: 32.8 g/dL (ref 30.0–36.0)
MCV: 92.1 fL (ref 78.0–100.0)
Platelets: 408 10*3/uL — ABNORMAL HIGH (ref 150–400)
RBC: 3.78 MIL/uL — ABNORMAL LOW (ref 3.87–5.11)
RDW: 12.9 % (ref 11.5–15.5)
WBC: 15.1 10*3/uL — ABNORMAL HIGH (ref 4.0–10.5)

## 2016-02-16 LAB — URINE CULTURE

## 2016-02-16 LAB — ECHOCARDIOGRAM COMPLETE
FS: 28 % (ref 28–44)
Height: 61 in
IVS/LV PW RATIO, ED: 0.74
LA ID, A-P, ES: 32 mm
LA diam end sys: 32 mm
LA diam index: 2.22 cm/m2
LA vol A4C: 34 ml
LA vol index: 26.1 mL/m2
LA vol: 37.7 mL
LV PW d: 7.79 mm — AB (ref 0.6–1.1)
LVOT area: 2.27 cm2
LVOT diameter: 17 mm
Reg peak vel: 273 cm/s
TR max vel: 273 cm/s
Weight: 1710.4 oz

## 2016-02-16 LAB — GLUCOSE, CAPILLARY
GLUCOSE-CAPILLARY: 148 mg/dL — AB (ref 65–99)
GLUCOSE-CAPILLARY: 117 mg/dL — AB (ref 65–99)

## 2016-02-16 MED ORDER — ATORVASTATIN CALCIUM 40 MG PO TABS: 40.0000 mg | ORAL_TABLET | Freq: Every day | ORAL | 3 refills | Status: DC

## 2016-02-16 MED ORDER — METOPROLOL TARTRATE 100 MG PO TABS: 100.0000 mg | ORAL_TABLET | Freq: Two times a day (BID) | ORAL | 3 refills | Status: DC

## 2016-02-16 MED ORDER — METOPROLOL TARTRATE 50 MG PO TABS
75.0000 mg | ORAL_TABLET | Freq: Two times a day (BID) | ORAL | Status: DC
  Administered 2016-02-16: 75 mg via ORAL

## 2016-02-16 MED ORDER — TRAMADOL HCL 50 MG PO TABS: 50.0000 mg | ORAL_TABLET | ORAL | 0 refills | Status: DC | PRN

## 2016-02-16 MED ORDER — GLUCERNA SHAKE PO LIQD: 237.0000 mL | Freq: Two times a day (BID) | ORAL | Status: DC

## 2016-02-16 MED ORDER — LISINOPRIL 2.5 MG PO TABS: 2.5000 mg | ORAL_TABLET | Freq: Every day | ORAL | 3 refills | Status: DC

## 2016-02-16 MED ORDER — ASPIRIN 81 MG PO TBEC: 81.0000 mg | DELAYED_RELEASE_TABLET | Freq: Every day | ORAL | Status: AC

## 2016-02-16 NOTE — Care Management Important Message (Signed)
Important Message  Patient Details  Name: Carolyn Burns MRN: [id number] Date of Birth: 29-Jul-1940   Medicare Important Message Given:  Yes    Kyla BalzarineShealy, Tyja Gortney Abena 02/16/2016, 10:59 AM

## 2016-02-16 NOTE — Progress Notes (Signed)
CARDIAC REHAB PHASE I   PRE:  Rate/Rhythm: 105 ST trig PVCs  BP:  Supine:   Sitting: 124/63  Standing:    SaO2: 96%RA  MODE:  Ambulation: 550 ft   POST:  Rate/Rhythm: 118 ST PVCs  BP:  Supine:   Sitting: 144/73  Standing:    SaO2: 96%RA 0826-0855 Pt walked 550 ft on RA with rolling walker and minimal asst. Gait steady. Tolerated well. To recliner with call bell.   Luetta Nuttingharlene Alonda Weaber, RN BSN  02/16/2016 8:52 AM

## 2016-02-16 NOTE — Progress Notes (Signed)
Initial Nutrition Assessment  DOCUMENTATION CODES:   Non-severe (moderate) malnutrition in context of chronic illness  INTERVENTION:   Will discontinue Ensure Enlive BID order.  Will order Butter Pecan Glucerna BID. Each oral nutrition supplement provides 220 kcal and 10 grams of protein.  NUTRITION DIAGNOSIS:   Malnutrition related to chronic illness (CAD) as evidenced by moderate depletions of muscle mass, moderate depletion of body fat.  GOAL:   Patient will meet greater than or equal to 90% of their needs  MONITOR:   PO intake, Supplement acceptance, Labs, Weight trends  REASON FOR ASSESSMENT:   Malnutrition Screening Tool   ASSESSMENT:   75 yo female with PMH significant only for OA and GERD who [facility] S[facility]  Spoke with pt and husband at bedside. Pt reports poor appetite since admission but that it is improving. Per chart, pt has been consuming 75-100% of meals since 9/11.  Pt has progressed from a clear liquid diet to a heart healthy/carb modified diet. Pt reports good appetite PTA. Pt reports consuming toast for breakfast and some sort of meat (chicken, fish) and vegetables for dinner PTA.  Pt reports no intentional weight loss PTA. Pt has lost some weight during admission, however, this is most likely due to fluid fluctuations.  Pt reports receiving Ensure Enlive oral nutrition supplement but that she believes it gave her diarrhea. Pt has not been consuming oral nutrition supplement since diarrhea incident. Intern suggested that the pt try Glucerna oral nutrition supplement due to the pt's high CBGs. Intern reiterated the importance of good nutrition while in the hospital to promote healing and prevent further weight loss. Pt was agreeable to try Glucerna and requested the [employer] flavor.  Labs reviewed. CBGs since 9/10: 119-182 Medications reviewed. Procedures: CABG and TEE 9/7 NFPE: Exam performed. Moderate muscle depletion and  moderate fat depletion noted. No edema noted.  Diet Order:  Diet heart healthy/carb modified Room service appropriate? Yes; Fluid consistency: Thin  Skin:  Wound (see comment) (Lower left back skin tear)  Last BM:  02/15/16  Height:   Ht Readings from Last 1 Encounters:  02/01/16 5\' 1"  (1.549 m)    Weight:   Wt Readings from Last 1 Encounters:  02/16/16 106 lb 14.4 oz (48.5 kg)    Ideal Body Weight:  47.7 kg  BMI:  Body mass index is 20.2 kg/m.  Estimated Nutritional Needs:   Kcal:  1500-1700  Protein:  70-85 grams  Fluid:  1.5-1.7 L  EDUCATION NEEDS:   No education needs identified at this time  Rosemarie AxKate Gerrald Basu Dietetic Intern Pager Number: [phone]

## 2016-02-16 NOTE — Progress Notes (Signed)
Patient in a stable condition, this RN went over discharge instructions with patient they verbalised undersensing, paper prescriptions given to patient, patient belongings at bedside, iv removed, tele dc ccmd notified

## 2016-02-16 NOTE — Progress Notes (Signed)
Patient Name: Carolyn Burns Date of Encounter: 02/16/2016  Active Problems:   NSTEMI (non-ST elevated myocardial infarction) (HCC)   Osteoarthritis   GERD (gastroesophageal reflux disease)   Length of Stay: 16  SUBJECTIVE  No chest pain, shortness of breath, she slept well the last night.  CURRENT MEDS . aspirin EC  81 mg Oral Daily  . atorvastatin  40 mg Oral q1800  . chlorhexidine  15 mL Mouth Rinse BID  . estradiol  0.5 mg Oral Daily  . feeding supplement (ENSURE ENLIVE)  237 mL Oral BID BM  . insulin aspart  0-24 Units Subcutaneous TID AC & HS  . lisinopril  2.5 mg Oral Daily  . mouth rinse  15 mL Mouth Rinse QID  . metoprolol tartrate  75 mg Oral BID  . pantoprazole  40 mg Oral Daily  . progesterone  100 mg Oral Daily  . sodium chloride flush  3 mL Intravenous Q12H  . sodium chloride flush  3 mL Intravenous Q12H   OBJECTIVE  Vitals:   02/15/16 0900 02/15/16 1342 02/15/16 2035 02/16/16 0547  BP: (!) 117/101 113/64 (!) 142/69 (!) 149/76  Pulse: 94 81 94 100  Resp:  20 20 20   Temp:  98.3 F (36.8 C) 98.7 F (37.1 C) 99.3 F (37.4 C)  TempSrc:  Oral Oral Oral  SpO2:   98% 99%  Weight:    106 lb 14.4 oz (48.5 kg)  Height:        Intake/Output Summary (Last 24 hours) at 02/16/16 0944 Last data filed at 02/16/16 0546  Gross per 24 hour  Intake              360 ml  Output              500 ml  Net             -140 ml   Filed Weights   02/14/16 0536 02/15/16 0513 02/16/16 0547  Weight: 109 lb 9.6 oz (49.7 kg) 109 lb 6.4 oz (49.6 kg) 106 lb 14.4 oz (48.5 kg)   PHYSICAL EXAM  General: Pleasant, NAD. Neuro: Alert and oriented X 3. Moves all extremities spontaneously. Psych: Normal affect. HEENT:  Normal  Neck: Supple without bruits or JVD. Lungs:  Resp regular and unlabored, CTA. S/P sternotomy Heart: RRR no s3, s4, or murmurs. Abdomen: Soft, non-tender, non-distended, BS + x 4.  Extremities: No clubbing, cyanosis or edema. DP/PT/Radials 2+ and  equal bilaterally.  Accessory Clinical Findings  CBC  Recent Labs  02/15/16 0345 02/16/16 0341  WBC 16.6* 15.1*  HGB 10.1* 11.4*  HCT 31.3* 34.8*  MCV 92.1 92.1  PLT 308 408*   Basic Metabolic Panel  Recent Labs  02/14/16 0314 02/15/16 0345  NA 141 138  K 3.7 4.2  CL 107 106  CO2 24 22  GLUCOSE 132* 125*  BUN 13 11  CREATININE 0.70 0.67  CALCIUM 8.5* 8.8*    Radiology/Studies  Dg Chest 2 View  Result Date: 02/13/2016 CLINICAL DATA:  Status post CABG. EXAM: CHEST  2 VIEW COMPARISON:  02/12/2016. FINDINGS: Right IJ sheath and left chest tube have been removed in the interval. The midline mediastinal/ pericardial drain has been pulled. There is a tiny right-sided pneumothorax evident. The cardio pericardial silhouette is enlarged. Bibasilar atelectasis noted. Telemetry leads overlie the chest. IMPRESSION: 1. Tiny right apical pneumothorax is new in the interval. 2. Interval removal of support apparatus. 3. Bibasilar atelectasis.   TELE:  ASSESSMENT AND PLAN  1. CAD, s/p CABG x 3 on 9/7, on ASA, atorvastatin, low dose lisinopril and metoprolol  2. LV dysfunction - 40-45%, repeat echo shows improved LVEF, now 50-55% (reviewed preliminary bedside), continue lisinopril and metoprolol, at this point she is not fluid overloaded and rather dry with mild fever, agree with holding lasix, I would repeat her echocardiogram for evaluation of LVEF.   3. Very frequent PVCs in a pattern of trigeminy, couplets, no more nsVTs, she remains tachycardic and borderline hypertension after discontinuation of cardizem, I would increase metoprolol to 100 mg po BID and arrange for a follow , I would arrange for a close follow up in our clinic the next week and adjust as necessary.  Stable for the discharge today.   Signed, Tobias AlexanderKatarina Esmay Amspacher MD, [facility] 02/16/2016

## 2016-02-16 NOTE — Progress Notes (Addendum)
[address]       [place] [address]             [phone]      6 Days Post-Op Procedure(s) (LRB): CORONARY ARTERY BYPASS GRAFTING (CABG)x 3 with endoscopic harvesting of right saphenous vein (N/A) TRANSESOPHAGEAL ECHOCARDIOGRAM (TEE) (N/A) Subjective: Feels ok, no specific c/o  Objective: Vital signs in last 24 hours: Temp:  [98.3 F (36.8 C)-99.3 F (37.4 C)] 99.3 F (37.4 C) (09/13 0547) Pulse Rate:  [81-100] 100 (09/13 0547) Cardiac Rhythm: Normal sinus rhythm (09/12 2000) Resp:  [20] 20 (09/13 0547) BP: (113-149)/(64-101) 149/76 (09/13 0547) SpO2:  [98 %-99 %] 99 % (09/13 0547) Weight:  [106 lb 14.4 oz (48.5 kg)] 106 lb 14.4 oz (48.5 kg) (09/13 0547)  Hemodynamic parameters for last 24 hours:    Intake/Output from previous day: 09/12 0701 - 09/13 0700 In: 600 [P.O.:600] Out: 700 [Urine:700] Intake/Output this shift: No intake/output data recorded.  General appearance: alert, cooperative and no distress Heart: regular rate and rhythm and frequent extrasystole Lungs: clear to auscultation bilaterally Abdomen: benign Extremities: no edema Wound: incis healing well  Lab Results:  Recent Labs  02/15/16 0345 02/16/16 0341  WBC 16.6* 15.1*  HGB 10.1* 11.4*  HCT 31.3* 34.8*  PLT 308 408*   BMET:  Recent Labs  02/14/16 0314 02/15/16 0345  NA 141 138  K 3.7 4.2  CL 107 106  CO2 24 22  GLUCOSE 132* 125*  BUN 13 11  CREATININE 0.70 0.67  CALCIUM 8.5* 8.8*    PT/INR: No results for input(s): LABPROT, INR in the last 72 hours. ABG    Component Value Date/Time   PHART 7.409 02/10/2016 2327   HCO3 23.5 02/10/2016 2327   TCO2 23 02/11/2016 1814   ACIDBASEDEF 1.0 02/10/2016 2327   O2SAT 100.0 02/10/2016 2327   CBG (last 3)   Recent Labs  02/15/16 1643 02/15/16 2035 02/16/16 0619  GLUCAP 161* 141* 148*    Meds Scheduled Meds: . aspirin EC  81 mg Oral Daily  . atorvastatin  40 mg Oral q1800  . chlorhexidine  15 mL  Mouth Rinse BID  . estradiol  0.5 mg Oral Daily  . feeding supplement (ENSURE ENLIVE)  237 mL Oral BID BM  . insulin aspart  0-24 Units Subcutaneous TID AC & HS  . lisinopril  2.5 mg Oral Daily  . mouth rinse  15 mL Mouth Rinse QID  . metoprolol tartrate  75 mg Oral BID  . pantoprazole  40 mg Oral Daily  . progesterone  100 mg Oral Daily  . sodium chloride flush  3 mL Intravenous Q12H  . sodium chloride flush  3 mL Intravenous Q12H   Continuous Infusions:  PRN Meds:.sodium chloride, diphenhydrAMINE, metoprolol, ondansetron (ZOFRAN) IV, oxyCODONE, sodium chloride flush, sodium chloride flush, traMADol  Xrays No results found.  Assessment/Plan: S/P Procedure(s) (LRB): CORONARY ARTERY BYPASS GRAFTING (CABG)x 3 with endoscopic harvesting of right saphenous vein (N/A) TRANSESOPHAGEAL ECHOCARDIOGRAM (TEE) (N/A)  1 no fevers, leukocytosis trending down, UA-neg, no clinical signs of current infection 2 frequent ectopy - no changes will ask cardiology to see just to make sure no changes from their perspective 3 sugars elevated , only eating primarily fruit with meals as doesn't like other food. Will need to F/U outpatient 4 poss home today  LOS: 16 days    Carolyn Burns,Carolyn Burns 02/16/2016  HR remains elevated on lopressor 50 BID Cardiology to review meds prior to  DC HHN for skilled observation ordered  patient examined and medical record reviewed,agree with above note. Kathlee Nationseter Van Trigt III 02/16/2016

## 2016-02-16 NOTE — Progress Notes (Signed)
  Echocardiogram 2D Echocardiogram has been performed.  Delcie RochENNINGTON, Morad Tal 02/16/2016, 10:11 AM

## 2016-02-16 NOTE — Care Management Note (Signed)
Case Management Note Donn PieriniKristi Tomas Schamp RN, BSN Unit 2W-Case Manager [phone]  Patient Details  Name: Carolyn Burns MRN: [id number] Date of Birth: [birth year]  Subjective/Objective:  Pt s/p CABG tx from ICU to 2W on 9/9 post op                  Action/Plan: PTA pt lived at home with spouse- also has a daughter who will be able to assist at discharge- [facility] spoke with pt and husband at bedside- [facility] [place]- per pt choice [facility]- referral called [facility]Karen [facility]- referral accepted- pt address and phone # confirmed in epic- no further CM needs noted.   Expected Discharge Date:  02/16/16               Expected Discharge Plan:  Home w Home Health Services  In-House Referral:     Discharge planning Services  CM Consult  Post Acute Care Choice:  Home Health Choice offered to:  Patient  DME Arranged:  N/A DME Agency:     HH Arranged:  RN HH Agency:  [facility]  Status of Service:  Completed, signed off  If discussed at Long Length of Stay Meetings, dates discussed:  9/11  Discharge Disposition: Home with Home Health   Additional Comments:  Darrold SpanWebster, Carolyn Preece Hall, RN 02/16/2016, 10:22 AM

## 2016-02-17 ENCOUNTER — Telehealth: Payer: Self-pay | Admitting: Cardiology

## 2016-02-17 ENCOUNTER — Other Ambulatory Visit: Payer: Self-pay

## 2016-02-17 DIAGNOSIS — Z7982 Long term (current) use of aspirin: Secondary | ICD-10-CM | POA: Diagnosis not present

## 2016-02-17 DIAGNOSIS — G4733 Obstructive sleep apnea (adult) (pediatric): Secondary | ICD-10-CM | POA: Diagnosis not present

## 2016-02-17 DIAGNOSIS — I214 Non-ST elevation (NSTEMI) myocardial infarction: Secondary | ICD-10-CM | POA: Diagnosis not present

## 2016-02-17 DIAGNOSIS — Z951 Presence of aortocoronary bypass graft: Secondary | ICD-10-CM | POA: Diagnosis not present

## 2016-02-17 DIAGNOSIS — M150 Primary generalized (osteo)arthritis: Secondary | ICD-10-CM | POA: Diagnosis not present

## 2016-02-17 DIAGNOSIS — Z48812 Encounter for surgical aftercare following surgery on the circulatory system: Secondary | ICD-10-CM | POA: Diagnosis not present

## 2016-02-17 DIAGNOSIS — K219 Gastro-esophageal reflux disease without esophagitis: Secondary | ICD-10-CM | POA: Diagnosis not present

## 2016-02-17 NOTE — Telephone Encounter (Signed)
Error

## 2016-02-17 NOTE — Patient Outreach (Signed)
[facility]) Care Management  02/17/2016  Carolyn Burns 07/19/1940 [id number]   Assessment: 75 year old recent admisison for myocardial infarction/coronary artery bypass graft. RNCM completed transition of care call. Member reports she is doing well. Denies pain. Carolyn Burns reports she has been contacted by home health. She denies any issues at this time.  Plan: update assigned RNCM  Carolyn SheriffJuana Kalisi Bevill, RN, MSN, [facility],CCM [facility] Community Care Coordinator Cell: [phone]

## 2016-02-18 ENCOUNTER — Other Ambulatory Visit: Payer: Self-pay | Admitting: *Deleted

## 2016-02-18 ENCOUNTER — Encounter: Payer: Self-pay | Admitting: Cardiothoracic Surgery

## 2016-02-18 ENCOUNTER — Ambulatory Visit (INDEPENDENT_AMBULATORY_CARE_PROVIDER_SITE_OTHER): Payer: Self-pay | Admitting: Cardiothoracic Surgery

## 2016-02-18 VITALS — BP 107/70 | HR 73 | Resp 16 | Ht 61.0 in | Wt 109.6 lb

## 2016-02-18 DIAGNOSIS — R053 Chronic cough: Secondary | ICD-10-CM

## 2016-02-18 DIAGNOSIS — Z951 Presence of aortocoronary bypass graft: Secondary | ICD-10-CM

## 2016-02-18 DIAGNOSIS — I214 Non-ST elevation (NSTEMI) myocardial infarction: Secondary | ICD-10-CM

## 2016-02-18 DIAGNOSIS — I25110 Atherosclerotic heart disease of native coronary artery with unstable angina pectoris: Secondary | ICD-10-CM

## 2016-02-18 DIAGNOSIS — M150 Primary generalized (osteo)arthritis: Secondary | ICD-10-CM | POA: Diagnosis not present

## 2016-02-18 DIAGNOSIS — R05 Cough: Secondary | ICD-10-CM

## 2016-02-18 DIAGNOSIS — Z48812 Encounter for surgical aftercare following surgery on the circulatory system: Secondary | ICD-10-CM | POA: Diagnosis not present

## 2016-02-18 MED ORDER — HYDROCOD POLST-CPM POLST ER 10-8 MG/5ML PO SUER: 5.0000 mL | Freq: Two times a day (BID) | ORAL | Status: DC | PRN

## 2016-02-18 MED ORDER — HYDROCOD POLST-CPM POLST ER 10-8 MG/5ML PO SUER: 5.0000 mL | Freq: Two times a day (BID) | ORAL | Status: AC | PRN

## 2016-02-18 NOTE — Progress Notes (Signed)
SHE IS ALLERGIC TO CODEINE...NAUSEA...WILL D/C SCRIPT

## 2016-02-21 ENCOUNTER — Other Ambulatory Visit: Payer: Self-pay | Admitting: Family Medicine

## 2016-02-21 DIAGNOSIS — R1013 Epigastric pain: Secondary | ICD-10-CM

## 2016-02-22 ENCOUNTER — Encounter: Payer: Self-pay | Admitting: Nurse Practitioner

## 2016-02-22 ENCOUNTER — Ambulatory Visit (INDEPENDENT_AMBULATORY_CARE_PROVIDER_SITE_OTHER): Payer: PPO | Admitting: Nurse Practitioner

## 2016-02-22 VITALS — BP 118/73 | HR 85 | Ht 61.0 in | Wt 108.2 lb

## 2016-02-22 DIAGNOSIS — I214 Non-ST elevation (NSTEMI) myocardial infarction: Secondary | ICD-10-CM | POA: Diagnosis not present

## 2016-02-22 DIAGNOSIS — I2510 Atherosclerotic heart disease of native coronary artery without angina pectoris: Secondary | ICD-10-CM

## 2016-02-22 DIAGNOSIS — E785 Hyperlipidemia, unspecified: Secondary | ICD-10-CM | POA: Diagnosis not present

## 2016-02-22 DIAGNOSIS — I5181 Takotsubo syndrome: Secondary | ICD-10-CM

## 2016-02-22 DIAGNOSIS — I493 Ventricular premature depolarization: Secondary | ICD-10-CM | POA: Diagnosis not present

## 2016-02-22 NOTE — Progress Notes (Signed)
  Review of Systems:   Please see the history of present illness.    ROS

## 2016-02-22 NOTE — Progress Notes (Signed)
Office Visit    Patient Name: Carolyn Burns Date of Encounter: 02/22/2016  Primary Care Provider:  Gweneth DimitriMCNEILL,[name], MD Primary Cardiologist:  J. Hochrein, MD   Chief Complaint    75 year old female status post recent non-STEMI and CABG who presents for follow-up.  Past Medical History    Past Medical History:  Diagnosis Date  . CAD (coronary artery disease)    a. 01/2016 NSTEMI/Cath: LM 75, LAD 70ost, LCX 50ost, 70p, LPDA 70, OM1 90;  b. 02/10/2016 CABG x 3 (LIMA->LAD, VG->OM1, VG->OM2).  . Ischemic cardiomyopathy    a. 02/02/2016 Echo: EF 35-40% w/ mid-apicalanteroseptal and apical AK, Gr1 DD-->high suspicion for takotsubo;  b. 02/16/2016 Echo (post-cabg): EF 50-55%, midanteroseptal HK, Gr1 DD, mild MR, PASP 33mmHg, triv effusion.  . Osteoarthritis   . PVC's (premature ventricular contractions)    a. 02/2016 Freq pvc's noted post-op.   Past Surgical History:  Procedure Laterality Date  . CARDIAC CATHETERIZATION N/A 02/01/2016   Procedure: Left Heart Cath and Coronary Angiography;  Surgeon: Lennette Biharihomas A Kelly, MD;  [facility];  Service: Cardiovascular;  Laterality: N/A;  . CORONARY ARTERY BYPASS GRAFT N/A 02/10/2016   Procedure: CORONARY ARTERY BYPASS GRAFTING (CABG)x 3 with endoscopic harvesting of right saphenous vein;  Surgeon: Kerin PernaPeter Van Trigt, MD;  [facility];  Service: Open Heart Surgery;  Laterality: N/A;  . TEE WITHOUT CARDIOVERSION N/A 02/10/2016   Procedure: TRANSESOPHAGEAL ECHOCARDIOGRAM (TEE);  Surgeon: Kerin PernaPeter Van Trigt, MD;  [facility];  Service: Open Heart Surgery;  Laterality: N/A;    Allergies  Allergies  Allergen Reactions  . Aleve [Naproxen Sodium] Itching and Other (See Comments)    Reaction:  Redness on palms of hands/bottom of feet   . Codeine Nausea Only    History of Present Illness    75 year old female with the above complex past medical history. She was admitted to [facility] in late August with chest pain and ruled in for  non-STEMI. She underwent catheterization revealing severe left main, ostial LAD, proximal circumflex, and first obtuse marginal disease. EF was initially depressed at 35-40% with suggestion of taco soup or cardiomyopathy superimposed on ischemic cardiomyopathy. She was placed on medical therapy and evaluated by thoracic surgery with recommendation for coronary artery bypass grafting once she had some improvement in ejection fraction. Initially, there was a plan to have her go home and come back for outpatient follow-up, however follow-up echo showed improvement in LV function and decision was made to pursue bypass while hospitalized. This took place on September 7. She had a relatively uneventful post bypass hospital course though she did have frequent PVCs requiring titration of her beta blocker.  Since d/c, she has done quite well.  She has had only minimal chest wall pain and has not required any prn pain meds.  She denies angina or dyspnea.  She has been ambulating some around the house as recommended by the inpt cardiac rehab team, and she has not been noticing any significant limitations.  She did have a small area along her sternal scar, which was draining and she saw Dr. Donata ClayVan Trigt and was placed on Keflex (she believes that's what she's taking - I can't find a record of this in Epic).  Since then, the area has looked better.  She denies palpitations, pnd, orthopnea, n, v, dizziness, syncope, edema, weight gain, or early satiety. She does plan on enrolling in cardiac rehab once cleared to do so.  Home Medications  Prior to Admission medications   Medication Sig Start Date End Date Taking? Authorizing Provider  aspirin EC 81 MG EC tablet Take 1 tablet (81 mg total) by mouth daily. 02/17/16  Yes Erin R Barrett, PA-C  atorvastatin (LIPITOR) 40 MG tablet Take 1 tablet (40 mg total) by mouth daily at 6 PM. 02/16/16  Yes Erin R Barrett, PA-C  celecoxib (CELEBREX) 200 MG capsule Take 200 mg by mouth daily.    Yes Historical Provider, MD  cholecalciferol (VITAMIN D) 1000 units tablet Take 2,000 Units by mouth 2 (two) times daily.   Yes Historical Provider, MD  estradiol (ESTRACE) 0.5 MG tablet Take 0.5 mg by mouth daily.   Yes Historical Provider, MD  lisinopril (PRINIVIL,ZESTRIL) 2.5 MG tablet Take 1 tablet (2.5 mg total) by mouth daily. 02/17/16  Yes Erin R Barrett, PA-C  Metoprolol Tartrate (LOPRESSOR) 100 MG tablet Take 1 tablet (100 mg total) by mouth 2 (two) times daily. 02/16/16  Yes Erin R Barrett, PA-C  progesterone (PROMETRIUM) 100 MG capsule Take 100 mg by mouth daily.   Yes Historical Provider, MD    Review of Systems    As above, doing well w/o chest pain, dyspnea, pnd, orthopnea, n, v, dizziness, syncope, edema, or early satiety.  All other systems reviewed and are otherwise negative except as noted above.  Physical Exam    VS:  BP 118/73   Pulse 85   Ht 5\' 1"  (1.549 m)   Wt 108 lb 3.2 oz (49.1 kg)   BMI 20.44 kg/m  , BMI Body mass index is 20.44 kg/m. GEN: Well nourished, well developed, in no acute distress.  HEENT: normal.  Neck: Supple, no JVD, carotid bruits, or masses. Cardiac: RRR, no murmurs, rubs, or gallops. No clubbing, cyanosis, edema. R lower leg surgical site is healing well w/o erythema or drainage. Radials/DP/PT 2+ and equal bilaterally.  Sternal scar is healing well w/ a small area of very mild erythema without drainage midway down along the right sternal border. Respiratory:  Respirations regular and unlabored, clear to auscultation bilaterally. GI: Soft, nontender, nondistended, BS + x 4. Abd chest tube sites are healing well w/o erythema or drainage. MS: no deformity or atrophy. Skin: warm and dry, no rash. Neuro:  AAOx3.  Strength and sensation are intact. Psych: Normal affect.  Accessory Clinical Findings    ECG - Regular sinus rhythm, 85, anterolateral T-wave inversion with subtle ST elevation in lead 3-similar to prior ECGs.  Assessment & Plan      1.  Non-ST segment elevation myocardial infarction, subsequent care/coronary artery disease: Status post recent admission and subsequent coronary artery bypass grafting 3. She has done well since her discharge and has not been having any angina. She has minimal chest wall discomfort and has not required any when necessary narcotics. She did have an area of drainage along her sternal surgical scar however this has improved significantly since being placed on Keflex. She otherwise remains on aspirin, statin, beta blocker, and ACE inhibitor therapy. She does plan on enrolling in cardiac rehabilitation once cleared to do so. She has follow-up with thoracic surgery next week.  2. Takotsubo Cardiomyopathy/ICM:  Ms. Stephens was noted to have LV dysfunction upon admission with recovery of LV function prior to discharge. She is euvolemic on exam and her weight has been stable at home. She remains on beta blocker and ACE inhibitor therapy.  3. PVCs: Patient was noted to have a fair amount of PVCs burden prior to discharge. ECG today  shows no PVCs. She denies palpitations. She remains on beta blocker therapy.  4. HL:  LDL 127 during recent admission.  Cont lipitor 40.  She prev had myalgias w/ lipitor but is tolerating this dose up to this point.  F/u lipids/lft's in 6 wks.  if she develops myalgias, would plan to switch her to crestor.  5.  Disposition: She will have follow-up lipids and LFTs in approximately 6 weeks. She will follow-up with Dr. Antoine Poche in 2 months or sooner if necessary.   Nicolasa Ducking, NP 02/22/2016, 11:10 AM

## 2016-02-22 NOTE — Patient Instructions (Signed)
Labwork: Your physician recommends that you return for a FASTING lipid profile and Hepatic Function Panel in 6 weeks.   Follow- Up:  Your physician recommends that you schedule a follow-up appointment in: 2-3 months with Dr. Antoine PocheHochrein.  If you need a refill on your cardiac medications before your next appointment, please call your pharmacy.

## 2016-02-24 ENCOUNTER — Other Ambulatory Visit: Payer: Self-pay | Admitting: *Deleted

## 2016-02-24 ENCOUNTER — Encounter: Payer: Self-pay | Admitting: *Deleted

## 2016-02-24 NOTE — Patient Outreach (Signed)
[facility] Care Management [facility] CM Telephone Outreach, Transition of Care day 8 02/24/2016  Carolyn Burns January 04, 1941 [id number]  Successful telephone outreach to Carolyn CoyerNancy Burns, 75 y/o female [facility] CM for transition of care after recent hospitalization August 29-February 16, 2016 for NSTEMI.  Patient had CABG x 3 on February 10, 2016.  Patient was discharged home with home health [facility] RN services.  HIPAA/ identity verified; [facility] CM services explained/ verbal consent obtained.  Today, patient continues to report that she is "doing well," and she denies concerns, needs, issues or problems.  Mrs. Carolyn Burns confirms that she attended post-discharge provider appointments with [facility] last week, and cardiology on Tuesday 02/22/16, stating that she "got good reports" during both visits, where "nothing was changed."  Patient verbalizes an accurate reporting of upcoming provider appointments, and states that her family will provide transportation to appointments.  Patient reports HH services have started and "are going well."  Patient denies community resource needs, and states that her husband and daughter are very supportive and actively involved in her daily care as needed.  Patient states that her daughter cooks dinner for her every night and checks on her frequently.  Patient reports that she has all of her medications and takes as prescribed.  Patient confirms that she is monitoring and recording her daily weights, reporting a weight today of 108.2 lbs.  Patient verbalized a good understanding of self-health management of cardiac disease/ MI, and we discussed general care guidelines for same.  We scheduled initial [facility] Community CM in-home visit today.  Plan:  Patient will continue to take her medications as prescribed and attend all scheduled provider appointments.  Patient will continue working with home health RN services as  ordered.  Patient will continue to monitor and record her daily weights.  Patient will contact her providers for any new concerns, questions, problems, or issues.   [facility] Community CM involvement for transition of care to continue with telephone outreach scheduled for next week and initial home visit next month.  Caryl PinaLaine Mckinney Tesean Stump, RN, BSN, [employer] [employer] Community Care Coordinator [facility] Care Management  [phone]

## 2016-02-29 ENCOUNTER — Telehealth: Payer: Self-pay | Admitting: Cardiology

## 2016-02-29 NOTE — Telephone Encounter (Signed)
[facility] on 05/25/16 with Dr. Antoine PocheHochrein, [facility] ([facility])

## 2016-03-01 ENCOUNTER — Ambulatory Visit (INDEPENDENT_AMBULATORY_CARE_PROVIDER_SITE_OTHER): Payer: Self-pay | Admitting: Cardiothoracic Surgery

## 2016-03-01 ENCOUNTER — Ambulatory Visit: Payer: PPO | Admitting: Cardiology

## 2016-03-01 VITALS — BP 122/72 | HR 68 | Resp 16 | Ht 61.0 in | Wt 108.0 lb

## 2016-03-01 DIAGNOSIS — I25110 Atherosclerotic heart disease of native coronary artery with unstable angina pectoris: Secondary | ICD-10-CM

## 2016-03-01 DIAGNOSIS — Z951 Presence of aortocoronary bypass graft: Secondary | ICD-10-CM

## 2016-03-01 DIAGNOSIS — I214 Non-ST elevation (NSTEMI) myocardial infarction: Secondary | ICD-10-CM

## 2016-03-01 NOTE — Progress Notes (Signed)
PCP is [name],[name], MD Referring Provider is Lennette Bihari, MD  Chief Complaint  Patient presents with  . Routine Post Op    ck sternal drainage ....s/p CABG X 3 ...02/10/16    HPI: Post discharge office follow-up after multivessel CABG Preoperative three-vessel CAD with moderate LV dysfunction Postop patient did well but developed serosanguineous sternal drainage She presents today for wound check. She is feeling better, feeling stronger, No chest pain, edema, orthopnea, weight gain Appetite is still marginal  Past Medical History:  Diagnosis Date  . CAD (coronary artery disease)    a. 01/2016 NSTEMI/Cath: LM 75, LAD 70ost, LCX 50ost, 70p, LPDA 70, OM1 90;  b. 02/10/2016 CABG x 3 (LIMA->LAD, VG->OM1, VG->OM2).  . Ischemic cardiomyopathy    a. 02/02/2016 Echo: EF 35-40% w/ mid-apicalanteroseptal and apical AK, Gr1 DD-->high suspicion for takotsubo;  b. 02/16/2016 Echo (post-cabg): EF 50-55%, midanteroseptal HK, Gr1 DD, mild MR, PASP , triv effusion.  . Osteoarthritis   . PVC's (premature ventricular contractions)    a. 02/2016 Freq pvc's noted post-op.    Past Surgical History:  Procedure Laterality Date  . CARDIAC CATHETERIZATION N/A 02/01/2016   Procedure: Left Heart Cath and Coronary Angiography;  Surgeon: Lennette Bihari, MD;  Location: [facility] INVASIVE CV LAB;  Service: Cardiovascular;  Laterality: N/A;  . CORONARY ARTERY BYPASS GRAFT N/A 02/10/2016   Procedure: CORONARY ARTERY BYPASS GRAFTING (CABG)x 3 with endoscopic harvesting of right saphenous vein;  Surgeon: [name], MD;  Location: [facility] OR;  Service: Open Heart Surgery;  Laterality: N/A;  . TEE WITHOUT CARDIOVERSION N/A 02/10/2016   Procedure: TRANSESOPHAGEAL ECHOCARDIOGRAM (TEE);  Surgeon: [name], MD;  Location: [facility] OR;  Service: Open Heart Surgery;  Laterality: N/A;    No family history on file.  Social History Social History  Substance Use Topics  . Smoking status: Never Smoker  . Smokeless tobacco:  Never Used  . Alcohol use No    Current Outpatient Prescriptions  Medication Sig Dispense Refill  . aspirin EC 81 MG EC tablet Take 1 tablet (81 mg total) by mouth daily.    Marland Kitchen atorvastatin (LIPITOR) 40 MG tablet Take 1 tablet (40 mg total) by mouth daily at 6 PM. 30 tablet 3  . celecoxib (CELEBREX) 200 MG capsule Take 200 mg by mouth daily.    . cholecalciferol (VITAMIN D) 1000 units tablet Take 2,000 Units by mouth 2 (two) times daily.    Marland Kitchen estradiol (ESTRACE) 0.5 MG tablet Take 0.5 mg by mouth daily.    Marland Kitchen lisinopril (PRINIVIL,ZESTRIL) 2.5 MG tablet Take 1 tablet (2.5 mg total) by mouth daily. 30 tablet 3  . Metoprolol Tartrate (LOPRESSOR) 100 MG tablet Take 1 tablet (100 mg total) by mouth 2 (two) times daily. 60 tablet 3  . progesterone (PROMETRIUM) 100 MG capsule Take 100 mg by mouth daily.     Current Facility-Administered Medications  Medication Dose Route Frequency Provider Last Rate Last Dose  . chlorpheniramine-HYDROcodone (TUSSIONEX) 10-8 MG/5ML suspension 5 mL  5 mL Oral Q12H PRN [name], MD        Allergies  Allergen Reactions  . Aleve [Naproxen Sodium] Itching and Other (See Comments)    Reaction:  Redness on palms of hands/bottom of feet   . Codeine Nausea Only    Review of Systems  Sternal drainage has stopped No fever  BP 122/72   Pulse 68   Resp 16   Ht 5\' 1"  (1.549 m)   Wt 108 lb (49  kg)   SpO2 98% Comment: ON RA  BMI 20.41 kg/m  Physical Exam      Exam    General- alert and comfortable   Lungs- clear without rales, wheezes   Cor- regular rate and rhythm, no murmur , gallop   Abdomen- soft, non-tender   Extremities - warm, non-tender, minimal edema   Neuro- oriented, appropriate, no focal weakness   Diagnostic Tests: No chest x-ray  Impression: Sternal incision healing well Leg incision healing well  Plan: Return for clinic visit with chest x-ray as scheduled Patient encouraged to walk 20 minutes daily   Mikey BussingPeter Van Trigt III,  MD [facility] [phone]

## 2016-03-01 NOTE — Progress Notes (Signed)
NO MORE COUGH  AFTER THREE DAYS

## 2016-03-02 ENCOUNTER — Encounter: Payer: Self-pay | Admitting: *Deleted

## 2016-03-02 ENCOUNTER — Other Ambulatory Visit: Payer: Self-pay | Admitting: *Deleted

## 2016-03-02 DIAGNOSIS — Z7982 Long term (current) use of aspirin: Secondary | ICD-10-CM | POA: Diagnosis not present

## 2016-03-02 DIAGNOSIS — Z951 Presence of aortocoronary bypass graft: Secondary | ICD-10-CM | POA: Diagnosis not present

## 2016-03-02 DIAGNOSIS — G4733 Obstructive sleep apnea (adult) (pediatric): Secondary | ICD-10-CM | POA: Diagnosis not present

## 2016-03-02 DIAGNOSIS — K219 Gastro-esophageal reflux disease without esophagitis: Secondary | ICD-10-CM | POA: Diagnosis not present

## 2016-03-02 DIAGNOSIS — I214 Non-ST elevation (NSTEMI) myocardial infarction: Secondary | ICD-10-CM | POA: Diagnosis not present

## 2016-03-02 DIAGNOSIS — M150 Primary generalized (osteo)arthritis: Secondary | ICD-10-CM | POA: Diagnosis not present

## 2016-03-02 DIAGNOSIS — Z48812 Encounter for surgical aftercare following surgery on the circulatory system: Secondary | ICD-10-CM | POA: Diagnosis not present

## 2016-03-02 NOTE — Patient Outreach (Signed)
[facility]) Care [facility] CM Telephone Outreach, Transition of are day 15 03/02/2016  Carolyn Burns 10/26/1940 [id number]  Successful telephone outreach to Carolyn CoyerNancy Burns, 75 y/o female [facility] Community CM for transition of care after recent hospitalization August 29-February 16, 2016 for NSTEMI.  Patient had CABG x 3 on February 10, 2016.  Patient was discharged home with home health [facility](HH) RN services.  HIPAA/ identity verified.  Today, patient continues to report that she is "doing really well--I'm actually doing great" and she denies concerns, needs, issues or problems.  Carolyn Burns confirms that she attended post-discharge provider appointment with [facility] yesterday and reports that she "got a good report."   Carolyn Burns states that Dr. Morton PetersVan Tright checked her incision, which "looked good," and gave her permission to start taking showers again.  Carolyn Burns reports that there were no concerns with her progress voiced by Dr. Morton PetersVan Tright, and that she will be visiting him again next week for another incision check.  [name] denies pain today.  Patient reports HH services have continued and "are going well, but they will probably be discharging me today, because I am doing so good.... I don't think I need them anymore."    Patient continues to report that she has all of her medications and takes as prescribed.  Patient confirms that she is monitoring and recording her daily weights, reporting a weight today of 108.0 lbs.  Patient again verbalizes a good understanding of self-health management of cardiac disease/ MI, and denies questions of same.  We confirmed initial [facility] Community CM in-home visit today, previously scheduled for next week.  Plan:  Patient will continue to take her medications as prescribed and attend all scheduled provider appointments.  Patient will continue working with home health RN services as  ordered.  Patient will continue to monitor and record her daily weights.  Patient will contact her providers for any new concerns, questions, problems, or issues.   [facility] Community CM involvement for transition of care to continue with initial home visit next week.  Caryl PinaLaine Mckinney Janyla Biscoe, RN, BSN, [employer] Community Care Coordinator [facility] Care Management  [phone]

## 2016-03-08 ENCOUNTER — Ambulatory Visit: Payer: PPO | Admitting: Cardiothoracic Surgery

## 2016-03-09 ENCOUNTER — Other Ambulatory Visit: Payer: Self-pay | Admitting: Cardiothoracic Surgery

## 2016-03-09 ENCOUNTER — Other Ambulatory Visit: Payer: Self-pay | Admitting: *Deleted

## 2016-03-09 ENCOUNTER — Encounter: Payer: Self-pay | Admitting: *Deleted

## 2016-03-09 DIAGNOSIS — Z951 Presence of aortocoronary bypass graft: Secondary | ICD-10-CM

## 2016-03-09 NOTE — Patient Outreach (Signed)
[facility] Customer service manager [facility]) Care Management  [facility] Community CM Initial Home Visit, Transition of Care day 22 03/09/2016  GIANELLE MCCAUL 07/13/1940 [id number]  ROIZY HAROLD is an 75 y.o. female referred to [facility] Community CM for transition of care after recent hospitalization August 29-February 16, 2016 for NSTEMI. Patient had CABG x 3 on February 10, 2016. Patient was discharged home with home health [facility]) RN services.   Today, Mrs. Lamarche continues to report that she is "doing great." Mrs. Vavrek confirms that she has attended all post-hospital discharge provider appointmentswith [facility] surgeon and cardiologist, and again reports that she "got good reports from both."  Mrs. Raybon states that her appointment with Dr. Morton Peters yesterday was cancelled by his office, and she reports that she has a re-scheduled visit with him "tomorrow."  Mrs. Crady states that she has spoken with her PCP, but has not made a post-hospital discharge appointment with her PCP yet, stating, "she didn't tell me I needed to go see her when she called me."  I encouraged patient to consider making a hospital follow up visit with her PCP.  Mrs. Zarling denies pain and falls today, and safety issues were identified during today's in-home visit.  Mrs. Bergthold reports HH services have now stopped.   Patient continues to report that she has all of her medications and takes as prescribed. Patient was recently discharged from hospital and all medications were reviewed thoroughly with her.  Mrs. Priego has a good general understanding of the purpose, dosing, and scheduling of her medications.  Patient confirms that she is monitoring and recording her daily weights, reporting a weight today of 107.0 lbs. Patient again verbalizes a good understanding of self-health management of cardiac disease/ MI, and denies questions of same. We discussed signs/ symptoms of MI and CVA, as well as  reasons that would necessitate calling EMS or her medical providers urgently.  Mrs. Fullilove continues to deny community resource needs, and again states that she has a very supportive family who are actively involved in her care and assist whenever needed.  Mrs. Scialdone  denies concerns, needs, issues, questions or problems today.  Subjective: "I really can't believe all this happened to me.... I can't believe how good I am feeling now that I have been home for awhile."  Objective:    BP 116/60   Pulse 78   Resp 16   Ht 1.549 m (5\' 1" )   Wt 107 lb 3.2 oz (48.6 kg)   SpO2 98%   BMI 20.26 kg/m    Review of Systems  Constitutional: Positive for weight loss. Negative for fever and malaise/fatigue.       Patient reports weight loss of 10-12 pounds since hospital admission  Respiratory: Positive for cough. Negative for sputum production, shortness of breath and wheezing.   Cardiovascular: Negative.  Negative for chest pain, orthopnea and leg swelling.  Gastrointestinal: Negative.  Negative for abdominal pain and nausea.  Genitourinary: Negative.   Musculoskeletal: Positive for myalgias. Negative for falls.       History of arthritis  Neurological: Negative.  Negative for dizziness and weakness.  Psychiatric/Behavioral: Negative.  Negative for depression. The patient is not nervous/anxious.     Physical Exam  Constitutional: She is oriented to person, place, and time. She appears well-developed and well-nourished. No distress.  Cardiovascular: Normal rate, regular rhythm, normal heart sounds and intact distal pulses.   Pulses:      Radial pulses are 2+ on the right side, and  2+ on the left side.       Dorsalis pedis pulses are 2+ on the right side, and 2+ on the left side.  Midline chest incision completely healed and is C/D/I.  Old CABG pacer puncture wounds completely healed and are C/D/I  Respiratory: Effort normal and breath sounds normal. No respiratory distress. She  has no wheezes. She has no rales.  GI: Soft. Bowel sounds are normal.  Musculoskeletal: She exhibits no edema.  Neurological: She is alert and oriented to person, place, and time.  Skin: Skin is warm and dry.  Psychiatric: She has a normal mood and affect. Her behavior is normal. Judgment and thought content normal.    Encounter Medications:   Outpatient Encounter Prescriptions as of 03/09/2016  Medication Sig  . aspirin EC 81 MG EC tablet Take 1 tablet (81 mg total) by mouth daily.  Marland Kitchen. atorvastatin (LIPITOR) 40 MG tablet Take 1 tablet (40 mg total) by mouth daily at 6 PM.  . celecoxib (CELEBREX) 200 MG capsule Take 200 mg by mouth daily.  . cholecalciferol (VITAMIN D) 1000 units tablet Take 2,000 Units by mouth 2 (two) times daily.  Marland Kitchen. estradiol (ESTRACE) 0.5 MG tablet Take 0.5 mg by mouth daily.  Marland Kitchen. lisinopril (PRINIVIL,ZESTRIL) 2.5 MG tablet Take 1 tablet (2.5 mg total) by mouth daily.  . Metoprolol Tartrate (LOPRESSOR) 100 MG tablet Take 1 tablet (100 mg total) by mouth 2 (two) times daily.  . progesterone (PROMETRIUM) 100 MG capsule Take 100 mg by mouth daily.   Facility-Administered Encounter Medications as of 03/09/2016  Medication  . chlorpheniramine-HYDROcodone (TUSSIONEX) 10-8 MG/5ML suspension 5 mL    Functional Status:   In your present state of health, do you have any difficulty performing the following activities: 03/09/2016 02/24/2016  Hearing? - N  Vision? - N  Difficulty concentrating or making decisions? - N  Walking or climbing stairs? - N  Dressing or bathing? - N  Doing errands, shopping? - N  Quarry managerreparing Food and eating ? N -  Using the Toilet? N -  In the past six months, have you accidently leaked urine? N -  Do you have problems with loss of bowel control? N -  Managing your Medications? N -  Managing your Finances? N -  Housekeeping or managing your Housekeeping? N -  Some recent data might be hidden    Fall/Depression Screening:    PHQ 2/9 Scores  02/24/2016  PHQ - 2 Score 0    Assessment:  [name] is recuperating well after her recent hospitalization/ CABG.  Mrs. Ormond has a good understanding of self-health management of cardiac disease/ post CABG care.  [name] has a very supportive family that actively participate in her care. [name] is committed to self-health management of her current state of health.   Plan:   Patientwill continue to take her medications as prescribed and attend all scheduled provider appointments.  Patientwill continue to monitor and record her daily weights.  Patientwill contact her providers for any new concerns, questions, problems, or issues.   [facility] Community CMoutreachfortransition of care to continue with scheduled phone call next week.  I appreciate the opportunity to participate in Mrs. Paiva's care,  Caryl PinaLaine Mckinney Olie Dibert, RN, BSN, [employer] [facility] Community Care Coordinator [facility] Care Management  [phone]

## 2016-03-09 NOTE — Progress Notes (Signed)
Patient ID: Carolyn Burns, female   DOB: 07-26-1940, 75 y.o.   MRN: [id number] Progress Notes Encounter Date: 03/01/2016 Carolyn Perna, MD  Cardiothoracic Surgery    [] Hide copied text [] Hover for attribution information PCP is [name],[name], MD Referring Provider is Carolyn Bihari, MD      Chief Complaint  Patient presents with  . Routine Post Op    ck sternal drainage ....s/p CABG X 3 ...02/10/16    HPI: Post discharge office follow-up after multivessel CABG Preoperative three-vessel CAD with moderate LV dysfunction Postop patient did well but developed serosanguineous sternal drainage She presents today for wound check. She is feeling better, feeling stronger, No chest pain, edema, orthopnea, weight gain Appetite is still marginal      Past Medical History:  Diagnosis Date  . CAD (coronary artery disease)    a. 01/2016 NSTEMI/Cath: LM 75, LAD 70ost, LCX 50ost, 70p, LPDA 70, OM1 90;  b. 02/10/2016 CABG x 3 (LIMA->LAD, VG->OM1, VG->OM2).  . Ischemic cardiomyopathy    a. 02/02/2016 Echo: EF 35-40% w/ mid-apicalanteroseptal and apical AK, Gr1 DD-->high suspicion for takotsubo;  b. 02/16/2016 Echo (post-cabg): EF 50-55%, midanteroseptal HK, Gr1 DD, mild MR, PASP , triv effusion.  . Osteoarthritis   . PVC's (premature ventricular contractions)    a. 02/2016 Freq pvc's noted post-op.         Past Surgical History:  Procedure Laterality Date  . CARDIAC CATHETERIZATION N/A 02/01/2016   Procedure: Left Heart Cath and Coronary Angiography;  Surgeon: Carolyn Bihari, MD;  Location: [facility] INVASIVE CV LAB;  Service: Cardiovascular;  Laterality: N/A;  . CORONARY ARTERY BYPASS GRAFT N/A 02/10/2016   Procedure: CORONARY ARTERY BYPASS GRAFTING (CABG)x 3 with endoscopic harvesting of right saphenous vein;  Surgeon: Carolyn Perna, MD;  Location[facility] OR;  Service: Open Heart Surgery;  Laterality: N/A;  . TEE WITHOUT CARDIOVERSION N/A 02/10/2016   Procedure: TRANSESOPHAGEAL  ECHOCARDIOGRAM (TEE);  Surgeon: Carolyn Perna, MD;  Location: [facility] OR;  Service: Open Heart Surgery;  Laterality: N/A;    No family history on file.  Social History     Social History  Substance Use Topics  . Smoking status: Never Smoker  . Smokeless tobacco: Never Used  . Alcohol use No          Current Outpatient Prescriptions  Medication Sig Dispense Refill  . aspirin EC 81 MG EC tablet Take 1 tablet (81 mg total) by mouth daily.    Marland Kitchen atorvastatin (LIPITOR) 40 MG tablet Take 1 tablet (40 mg total) by mouth daily at 6 PM. 30 tablet 3  . celecoxib (CELEBREX) 200 MG capsule Take 200 mg by mouth daily.    . cholecalciferol (VITAMIN D) 1000 units tablet Take 2,000 Units by mouth 2 (two) times daily.    Marland Kitchen estradiol (ESTRACE) 0.5 MG tablet Take 0.5 mg by mouth daily.    Marland Kitchen lisinopril (PRINIVIL,ZESTRIL) 2.5 MG tablet Take 1 tablet (2.5 mg total) by mouth daily. 30 tablet 3  . Metoprolol Tartrate (LOPRESSOR) 100 MG tablet Take 1 tablet (100 mg total) by mouth 2 (two) times daily. 60 tablet 3  . progesterone (PROMETRIUM) 100 MG capsule Take 100 mg by mouth daily.              Current Facility-Administered Medications  Medication Dose Route Frequency Provider Last Rate Last Dose  . chlorpheniramine-HYDROcodone (TUSSIONEX) 10-8 MG/5ML suspension 5 mL  5 mL Oral Q12H PRN Carolyn Perna, MD  Allergies  Allergen Reactions  . Aleve [Naproxen Sodium] Itching and Other (See Comments)    Reaction:  Redness on palms of hands/bottom of feet   . Codeine Nausea Only    Review of Systems  Sternal drainage has stopped No fever  BP 122/72   Pulse 68   Resp 16   Ht 5\' 1"  (1.549 m)   Wt 108 lb (49 kg)   SpO2 98% Comment: ON RA  BMI 20.41 kg/m  Physical Exam      Exam    General- alert and comfortable   Lungs- clear without rales, wheezes   Cor- regular rate and rhythm, no murmur , gallop   Abdomen- soft, non-tender   Extremities - warm,  non-tender, minimal edema   Neuro- oriented, appropriate, no focal weakness   Diagnostic Tests: No chest x-ray  Impression: Sternal incision healing well Leg incision healing well  Plan: Return for clinic visit with chest x-ray as scheduled Patient encouraged to walk 20 minutes daily   Carolyn BussingPeter Van Burns III, MD [facility] [phone]    Electronically signed by Carolyn PernaPeter Van Trigt, MD at 03/01/2016 3:40 PM      Office Visit on 03/01/2016        Detailed Report

## 2016-03-10 ENCOUNTER — Ambulatory Visit
Admission: RE | Admit: 2016-03-10 | Discharge: 2016-03-10 | Disposition: A | Discharge status: Home / Self Care | Payer: PPO | Source: Ambulatory Visit | Attending: Cardiothoracic Surgery | Admitting: Cardiothoracic Surgery

## 2016-03-10 ENCOUNTER — Encounter: Payer: Self-pay | Admitting: Cardiothoracic Surgery

## 2016-03-10 ENCOUNTER — Encounter: Payer: Self-pay | Admitting: *Deleted

## 2016-03-10 ENCOUNTER — Ambulatory Visit (INDEPENDENT_AMBULATORY_CARE_PROVIDER_SITE_OTHER): Payer: Self-pay | Admitting: Cardiothoracic Surgery

## 2016-03-10 VITALS — BP 113/76 | HR 68 | Resp 16 | Ht 61.0 in | Wt 108.0 lb

## 2016-03-10 DIAGNOSIS — I214 Non-ST elevation (NSTEMI) myocardial infarction: Secondary | ICD-10-CM

## 2016-03-10 DIAGNOSIS — I25110 Atherosclerotic heart disease of native coronary artery with unstable angina pectoris: Secondary | ICD-10-CM

## 2016-03-10 DIAGNOSIS — R5383 Other fatigue: Secondary | ICD-10-CM | POA: Diagnosis not present

## 2016-03-10 DIAGNOSIS — Z951 Presence of aortocoronary bypass graft: Secondary | ICD-10-CM

## 2016-03-10 NOTE — Progress Notes (Signed)
PCP is [name],[name], MD Referring Provider is Lennette BihariKelly, Thomas A, MD  Chief Complaint  Patient presents with  . Routine Post Op    s/p CABG 02/10/16 with a CXR    [id number]:Continues to progress well after multivessel CABG a month ago No sternal drainage finished her oral antibiotic course Patient is ready to start outpatient rehabilitation and resume driving No recurrent angina, no signs of heart failure   Past Medical History:  Diagnosis Date  . CAD (coronary artery disease)    a. 01/2016 NSTEMI/Cath: LM 75, LAD 70ost, LCX 50ost, 70p, LPDA 70, OM1 90;  b. 02/10/2016 CABG x 3 (LIMA->LAD, VG->OM1, VG->OM2).  . Ischemic cardiomyopathy    a. 02/02/2016 Echo: EF 35-40% w/ mid-apicalanteroseptal and apical AK, Gr1 DD-->high suspicion for takotsubo;  b. 02/16/2016 Echo (post-cabg): EF 50-55%, midanteroseptal HK, Gr1 DD, mild MR, PASP 33mmHg, triv effusion.  . Osteoarthritis   . PVC's (premature ventricular contractions)    a. 02/2016 Freq pvc's noted post-op.    Past Surgical History:  Procedure Laterality Date  . CARDIAC CATHETERIZATION N/A 02/01/2016   Procedure: Left Heart Cath and Coronary Angiography;  Surgeon: Lennette Biharihomas A Kelly, MD;  [facility];  Service: Cardiovascular;  Laterality: N/A;  . CORONARY ARTERY BYPASS GRAFT N/A 02/10/2016   Procedure: CORONARY ARTERY BYPASS GRAFTING (CABG)x 3 with endoscopic harvesting of right saphenous vein;  Surgeon: Kerin PernaPeter Van Trigt, MD;  [facility];  Service: Open Heart Surgery;  Laterality: N/A;  . TEE WITHOUT CARDIOVERSION N/A 02/10/2016   Procedure: TRANSESOPHAGEAL ECHOCARDIOGRAM (TEE);  Surgeon: Kerin PernaPeter Van Trigt, MD;  [facility];  Service: Open Heart Surgery;  Laterality: N/A;    No family history on file.  Social History Social History  Substance Use Topics  . Smoking status: Never Smoker  . Smokeless tobacco: Never Used  . Alcohol use No    Current Outpatient Prescriptions  Medication Sig Dispense Refill  . aspirin EC 81 MG  EC tablet Take 1 tablet (81 mg total) by mouth daily.    Marland Kitchen. atorvastatin (LIPITOR) 40 MG tablet Take 1 tablet (40 mg total) by mouth daily at 6 PM. 30 tablet 3  . celecoxib (CELEBREX) 200 MG capsule Take 200 mg by mouth daily.    . cholecalciferol (VITAMIN D) 1000 units tablet Take 2,000 Units by mouth 2 (two) times daily.    Marland Kitchen. estradiol (ESTRACE) 0.5 MG tablet Take 0.5 mg by mouth daily.    Marland Kitchen. lisinopril (PRINIVIL,ZESTRIL) 2.5 MG tablet Take 1 tablet (2.5 mg total) by mouth daily. 30 tablet 3  . Metoprolol Tartrate (LOPRESSOR) 100 MG tablet Take 1 tablet (100 mg total) by mouth 2 (two) times daily. 60 tablet 3  . progesterone (PROMETRIUM) 100 MG capsule Take 100 mg by mouth daily.     No current facility-administered medications for this visit.     Allergies  Allergen Reactions  . Aleve [Naproxen Sodium] Itching and Other (See Comments)    Reaction:  Redness on palms of hands/bottom of feet   . Codeine Nausea Only    Review of Systems  Sleep improving Appetite and strength improving Walking 10-15 minutes daily  BP 113/76   Pulse 68   Resp 16   Ht 5\' 1"  (1.549 m)   Wt 108 lb (49 kg)   SpO2 98% Comment: ON RA  BMI 20.41 kg/m  Physical Exam      Exam    General- alert and comfortable   Lungs- clear without rales, wheezes  Cor- regular rate and rhythm, no murmur , gallop   Abdomen- soft, non-tender   Extremities - warm, non-tender, minimal edema   Neuro- oriented, appropriate, no focal weakness  Diagnostic Tests: Chest x-ray clear no pleural effusion  Impression: Excellent recovery one month status post CABG 3 Continue current meds Start outpatient cardiac rehabilitation  Plan: Return for office visit and review of progress in one month Carolyn Bussing, MD [facility] [phone]

## 2016-03-15 ENCOUNTER — Other Ambulatory Visit: Payer: Self-pay | Admitting: *Deleted

## 2016-03-15 NOTE — Patient Outreach (Signed)
[facility]) Care Management [facility] Community CM Telephone Outreach, Transition of Care day 28 03/15/2016  Naomie Deanancy Z Goris 21-Apr-1941 [id number]  Unsuccessful telephone outreach to Naomie DeanNancy Z Hoxworth, 75 y.o. female [facility] for [facility] of care after recent hospitalization August 29-February 16, 2016 for NSTEMI. Patient had CABG x 3 on February 10, 2016. Patient was discharged home with home health [facility](HH) RN services.   HIPAA compliant voice mail message left for patient, asking for return call.   Plan:   Will re-attempt [facility] Community CMoutreachfortransition of care later this week if I do not hear back from patient first.   Caryl PinaLaine Mckinney Chrisandra Wiemers, RN, BSN, CCRN [facility] Community Care Coordinator [facility] Care Management  [phone]

## 2016-03-17 ENCOUNTER — Other Ambulatory Visit: Payer: Self-pay | Admitting: *Deleted

## 2016-03-17 ENCOUNTER — Encounter: Payer: Self-pay | Admitting: *Deleted

## 2016-03-17 NOTE — Patient Outreach (Signed)
[place] [facility]) Care Management [place] Telephone Outreach, Transition of Care day 30 03/17/2016  Carolyn Burns 16-May-1941 [id number]   Successful telephone outreach to Carolyn Burns, 75 y.o. female referred to [place] for transition of care after recent hospitalization August 29-February 16, 2016 for NSTEMI. Patient had CABG x 3 on February 10, 2016. Patient was discharged home with home health [facility]) RN services.  HIPAA/ identity verified.  Today, Carolyn Burns states that she "is feeling the best I have since I was released form the hospital." Patient reports having "plenty of energy," and that during last week's office visit with Dr. Nils Pyle, she was given permission to resume driving.  Patient states that she is planning to go grocery shopping today.  I encouraged patient to not over-do activity and to rest frequently as she begins to increase her activity, which she agreed to do.  Patient continues to report that she has all of her medications and takes as prescribed, and that she has continued monitoring and recording her daily weights, reporting that she has "stayed in the same weight range."    Carolyn Burns  denies concerns, needs, issues, questions or problems today, and we discussed that she has almost met her previously established [facility] Community CM goals.  I explained to the patient that I would be placing another telephone call to her next week, and encouraged her to think about whether or not she wants to schedule another [place] CM in-home visit, which she agreed to do.  I again confirmed that patient has my direct contact information, the main number for [place] the [facility] 24- hour nurse advice line number.  Plan:   Patientwill continue to take her medications as prescribed and attend all scheduled provider appointments.  Patientwill continue to monitor and record her daily weights.  [place]  contact her providers for any new concerns, questions, problems, or issues.   [place] of care to continue with scheduled phone call next week.   Oneta Rack, RN, BSN, [employer] [facility] Care Management  [phone]

## 2016-03-20 ENCOUNTER — Encounter: Payer: Self-pay | Admitting: *Deleted

## 2016-03-20 ENCOUNTER — Other Ambulatory Visit: Payer: Self-pay | Admitting: *Deleted

## 2016-03-20 NOTE — Patient Outreach (Signed)
[place] [facility]) Care Management [place] Telephone Outreach, Transition of Care day 33 03/20/2016  SINDEE STUCKER [birth year] [id number]  Successful telephone outreach to [name], 75 y.o.femalereferred to [place] for transition of care after recent hospitalization August 29-February 16, 2016 for NSTEMI. Patient had CABG x 3 on February 10, 2016. Patient was discharged home with home health [facility]) RN services.  HIPAA/ identity verified.  Today, Mrs. Guadarrama reports that she "is doing great."  Mrs. Schowalter states that she is waiting to hear back from staff at Dr. Ewell Poe office about setting up outpatient cardiac rehab; patient states that if she has not heard back from them by next week, she will place a call to their office for more information.   Patient continues to report that she has all of her medications and takes as prescribed, and that she has continued monitoring and recording her daily weights, reporting that she has "stayed in the same weight range."    Mrs. Preslar denies concerns, needs, issues, questionsor problems today, and we discussed that she has now met her previously established [facility] Community CM goals.  Patient verbalized that she believes she is doing so well that she does not need another [facility] RN CM in-home visit.  I again confirmed that patient has my direct contact information, the main number for [place] the [facility] 24- hour nurse advice line number, should she wish to contact [facility] CM in the future.  Plan:   [place] close patient's [place] CM case and will notify patient's PCP of same.  Oneta Rack, RN, BSN, [employer] [facility] Care Management  [phone]

## 2016-04-03 DIAGNOSIS — E785 Hyperlipidemia, unspecified: Secondary | ICD-10-CM | POA: Diagnosis not present

## 2016-04-04 LAB — HEPATIC FUNCTION PANEL
ALBUMIN: 4.3 g/dL (ref 3.6–5.1)
ALT: 40 U/L — AB (ref 6–29)
AST: 25 U/L (ref 10–35)
Alkaline Phosphatase: 170 U/L — ABNORMAL HIGH (ref 33–130)
Bilirubin, Direct: 0.2 mg/dL (ref <=0.2)
Indirect Bilirubin: 0.5 mg/dL (ref 0.2–1.2)
TOTAL PROTEIN: 7.1 g/dL (ref 6.1–8.1)
Total Bilirubin: 0.7 mg/dL (ref 0.2–1.2)

## 2016-04-04 LAB — LIPID PANEL
CHOL/HDL RATIO: 3.7 ratio (ref <=5.0)
CHOLESTEROL: 141 mg/dL (ref 125–200)
HDL: 38 mg/dL — AB (ref >=46)
LDL Cholesterol: 83 mg/dL (ref <130)
TRIGLYCERIDES: 98 mg/dL (ref <150)
VLDL: 20 mg/dL (ref <30)

## 2016-04-05 ENCOUNTER — Ambulatory Visit (INDEPENDENT_AMBULATORY_CARE_PROVIDER_SITE_OTHER): Payer: Self-pay | Admitting: Cardiothoracic Surgery

## 2016-04-05 ENCOUNTER — Encounter: Payer: Self-pay | Admitting: Cardiothoracic Surgery

## 2016-04-05 VITALS — BP 117/67 | HR 60 | Resp 20 | Ht 61.0 in | Wt 108.0 lb

## 2016-04-05 DIAGNOSIS — I25110 Atherosclerotic heart disease of native coronary artery with unstable angina pectoris: Secondary | ICD-10-CM

## 2016-04-05 DIAGNOSIS — Z951 Presence of aortocoronary bypass graft: Secondary | ICD-10-CM

## 2016-04-05 DIAGNOSIS — I214 Non-ST elevation (NSTEMI) myocardial infarction: Secondary | ICD-10-CM

## 2016-04-05 NOTE — Progress Notes (Signed)
PCP is [name],[name], MD Referring Provider is Lennette BihariKelly, Thomas A, MD  Chief Complaint  Patient presents with  . Routine Post Op    3 week f/u    NWG:[id number]:Continues to do well following multivessel CABG. She has an appointment to start outpatient cardiac rehabilitation later this month She is driving and walking daily. No recurrent symptoms of CHF or angina Surgical incisions well-healed The patient had reduced LV function by echo preop and only need a postop echo 3-6 months following surgery.   Past Medical History:  Diagnosis Date  . CAD (coronary artery disease)    a. 01/2016 NSTEMI/Cath: LM 75, LAD 70ost, LCX 50ost, 70p, LPDA 70, OM1 90;  b. 02/10/2016 CABG x 3 (LIMA->LAD, VG->OM1, VG->OM2).  . Ischemic cardiomyopathy    a. 02/02/2016 Echo: EF 35-40% w/ mid-apicalanteroseptal and apical AK, Gr1 DD-->high suspicion for takotsubo;  b. 02/16/2016 Echo (post-cabg): EF 50-55%, midanteroseptal HK, Gr1 DD, mild MR, PASP 33mmHg, triv effusion.  . Osteoarthritis   . PVC's (premature ventricular contractions)    a. 02/2016 Freq pvc's noted post-op.    Past Surgical History:  Procedure Laterality Date  . CARDIAC CATHETERIZATION N/A 02/01/2016   Procedure: Left Heart Cath and Coronary Angiography;  Surgeon: Lennette Biharihomas A Kelly, MD;  [facility];  Service: Cardiovascular;  Laterality: N/A;  . CORONARY ARTERY BYPASS GRAFT N/A 02/10/2016   Procedure: CORONARY ARTERY BYPASS GRAFTING (CABG)x 3 with endoscopic harvesting of right saphenous vein;  Surgeon: Kerin PernaPeter Van Trigt, MD;  [facility];  Service: Open Heart Surgery;  Laterality: N/A;  . TEE WITHOUT CARDIOVERSION N/A 02/10/2016   Procedure: TRANSESOPHAGEAL ECHOCARDIOGRAM (TEE);  Surgeon: Kerin PernaPeter Van Trigt, MD;  [facility];  Service: Open Heart Surgery;  Laterality: N/A;    No family history on file.  Social History Social History  Substance Use Topics  . Smoking status: Never Smoker  . Smokeless tobacco: Never Used  . Alcohol use No     Current Outpatient Prescriptions  Medication Sig Dispense Refill  . aspirin EC 81 MG EC tablet Take 1 tablet (81 mg total) by mouth daily.    Marland Kitchen. atorvastatin (LIPITOR) 40 MG tablet Take 1 tablet (40 mg total) by mouth daily at 6 PM. 30 tablet 3  . celecoxib (CELEBREX) 200 MG capsule Take 200 mg by mouth daily.    . cholecalciferol (VITAMIN D) 1000 units tablet Take 2,000 Units by mouth 2 (two) times daily.    Marland Kitchen. estradiol (ESTRACE) 0.5 MG tablet Take 0.5 mg by mouth daily.    Marland Kitchen. lisinopril (PRINIVIL,ZESTRIL) 2.5 MG tablet Take 1 tablet (2.5 mg total) by mouth daily. 30 tablet 3  . Metoprolol Tartrate (LOPRESSOR) 100 MG tablet Take 1 tablet (100 mg total) by mouth 2 (two) times daily. 60 tablet 3  . progesterone (PROMETRIUM) 100 MG capsule Take 100 mg by mouth daily.     No current facility-administered medications for this visit.     Allergies  Allergen Reactions  . Aleve [Naproxen Sodium] Itching and Other (See Comments)    Reaction:  Redness on palms of hands/bottom of feet   . Codeine Nausea Only    Review of Systems  Weight stable No fever No edema  BP 117/67   Pulse 60   Resp 20   Ht 5\' 1"  (1.549 m)   Wt 108 lb (49 kg)   SpO2 98% Comment: RA  BMI 20.41 kg/m  Physical Exam      Exam    General- alert  and comfortable   Lungs- clear without rales, wheezes   Cor- regular rate and rhythm, no murmur , gallop   Abdomen- soft, non-tender   Extremities - warm, non-tender, minimal edema   Neuro- oriented, appropriate, no focal weakness   Diagnostic Tests: Last chest x-ray personally reviewed and is clear  Impression: Excellent early recovery after multivessel CABG Patient was reinforced the importance of outpatient cardiac rehabilitation phase II She'll continue her current medications  Plan: Cardiology follow-up in about a month Return here as necessary  Mikey BussingPeter Van Trigt III, MD [facility] [phone]

## 2016-04-06 ENCOUNTER — Telehealth: Payer: Self-pay | Admitting: *Deleted

## 2016-04-06 DIAGNOSIS — R7989 Other specified abnormal findings of blood chemistry: Secondary | ICD-10-CM

## 2016-04-06 DIAGNOSIS — R945 Abnormal results of liver function studies: Principal | ICD-10-CM

## 2016-04-06 NOTE — Telephone Encounter (Signed)
Pt aware of her lab results. She will go to [facility] office for repeat LFT's in 2 weeks (04/17/16) [place]

## 2016-04-06 NOTE — Telephone Encounter (Signed)
-----   Message from Ok Anishristopher R Berge, NP sent at 04/05/2016  3:35 PM EDT ----- Lipids are much improved overall, though LDL remains slightly higher than goal (currently 83, goal < 70).  She has a prior h/o myalgias when on lipitor, thus I am reluctant to titrate her dose at this time.  Further, her her alkaline phosphatase and ALT are mildly elevated.  Please have arrange for f/u LFT's in 2 wks to reassess.

## 2016-04-18 ENCOUNTER — Telehealth: Payer: Self-pay | Admitting: Cardiology

## 2016-04-18 ENCOUNTER — Other Ambulatory Visit: Payer: Self-pay | Admitting: *Deleted

## 2016-04-18 DIAGNOSIS — R945 Abnormal results of liver function studies: Principal | ICD-10-CM

## 2016-04-18 DIAGNOSIS — R7989 Other specified abnormal findings of blood chemistry: Secondary | ICD-10-CM | POA: Diagnosis not present

## 2016-04-18 LAB — HEPATIC FUNCTION PANEL
ALT: 87 U/L — ABNORMAL HIGH (ref 6–29)
AST: 83 U/L — ABNORMAL HIGH (ref 10–35)
Albumin: 4.2 g/dL (ref 3.6–5.1)
Alkaline Phosphatase: 149 U/L — ABNORMAL HIGH (ref 33–130)
BILIRUBIN DIRECT: 0.1 mg/dL (ref <=0.2)
BILIRUBIN TOTAL: 0.6 mg/dL (ref 0.2–1.2)
Indirect Bilirubin: 0.5 mg/dL (ref 0.2–1.2)
Total Protein: 6.7 g/dL (ref 6.1–8.1)

## 2016-04-18 NOTE — Telephone Encounter (Signed)
Recent lab order released for [facility] from [facility].

## 2016-04-19 ENCOUNTER — Telehealth: Payer: Self-pay | Admitting: *Deleted

## 2016-04-19 DIAGNOSIS — R7989 Other specified abnormal findings of blood chemistry: Secondary | ICD-10-CM

## 2016-04-19 DIAGNOSIS — R945 Abnormal results of liver function studies: Principal | ICD-10-CM

## 2016-04-19 NOTE — Addendum Note (Signed)
Addended by: Burnetta SabinWITTY, Evoleht Hovatter K on: 04/19/2016 10:23 AM   Modules accepted: Orders

## 2016-04-19 NOTE — Telephone Encounter (Signed)
Pt aware of her lab results. She will hold the Lipitor and come in for repeat lft on 05-02-16 Order in [facility]. Pt verbalized understanding.

## 2016-04-19 NOTE — Telephone Encounter (Signed)
-----   Message from Ok Anishristopher R Berge, NP sent at 04/19/2016  8:03 AM EST ----- LFT's remain elevated - are slightly higher.  Hold lipitor.  F/u LFT's in 2 wks.

## 2016-04-25 ENCOUNTER — Encounter (HOSPITAL_COMMUNITY)
Admission: RE | Admit: 2016-04-25 | Discharge: 2016-04-25 | Disposition: A | Discharge status: Home / Self Care | Payer: PPO | Source: Ambulatory Visit | Attending: Cardiology | Admitting: Cardiology

## 2016-04-25 VITALS — BP 120/70 | HR 50 | Ht 60.25 in | Wt 106.5 lb

## 2016-04-25 DIAGNOSIS — I252 Old myocardial infarction: Secondary | ICD-10-CM | POA: Insufficient documentation

## 2016-04-25 DIAGNOSIS — Z7982 Long term (current) use of aspirin: Secondary | ICD-10-CM | POA: Insufficient documentation

## 2016-04-25 DIAGNOSIS — Z951 Presence of aortocoronary bypass graft: Secondary | ICD-10-CM | POA: Diagnosis not present

## 2016-04-25 DIAGNOSIS — Z79899 Other long term (current) drug therapy: Secondary | ICD-10-CM | POA: Insufficient documentation

## 2016-04-25 DIAGNOSIS — I2510 Atherosclerotic heart disease of native coronary artery without angina pectoris: Secondary | ICD-10-CM | POA: Insufficient documentation

## 2016-04-25 DIAGNOSIS — M1990 Unspecified osteoarthritis, unspecified site: Secondary | ICD-10-CM | POA: Insufficient documentation

## 2016-04-25 DIAGNOSIS — I214 Non-ST elevation (NSTEMI) myocardial infarction: Secondary | ICD-10-CM

## 2016-04-25 NOTE — Progress Notes (Signed)
Cardiac Rehab Medication Review by a Pharmacist  Does the patient  feel that his/her medications are working for him/her?  yes  Has the patient been experiencing any side effects to the medications prescribed?  Yes - dry cough  Does the patient measure his/her own blood pressure or blood glucose at home?  no   Does the patient have any problems obtaining medications due to transportation or finances?   no  Understanding of regimen: good Understanding of indications: fair Potential of compliance: good    Pharmacist comments: Pt presents for initial cardiac rehab visit. Pt endorses a dry cough that occurred ~1 month after starting ACE-inhibitor. Counseled pt that that can be an adverse event associated with the medication and that she could consider talking to her doctor about changing agents if the cough becomes bothersome. Pt also reports not checking her BP regularly despite having a BP cuff at home - encouraged to monitor BP regularly and write down values to keep a trend of her BP. No other issues noted.  Fredonia HighlandMichael Jacqualyn Sedgwick, PharmD PGY-1 Pharmacy Resident Pager: [phone] 04/25/2016

## 2016-04-26 ENCOUNTER — Encounter (HOSPITAL_COMMUNITY): Payer: Self-pay

## 2016-04-26 NOTE — Progress Notes (Signed)
Cardiac Individual Treatment Plan  Patient Details  Name: Carolyn Burns MRN: [id number] Date of Birth: 05-09-1941 Referring Provider:   Flowsheet Row CARDIAC REHAB PHASE II ORIENTATION from 04/25/2016 in [facility] CARDIAC REHAB  Referring Provider  Rollene Rotunda, MD.      Initial Encounter Date:  Flowsheet Row CARDIAC REHAB PHASE II ORIENTATION from 04/25/2016 in [facility] CARDIAC REHAB  Date  04/25/16  Referring Provider  Rollene Rotunda, MD.      Visit Diagnosis: S/P CABG x 3  NSTEMI (non-ST elevated myocardial infarction) [facility])  Patient's Home Medications on Admission:  Current Outpatient Prescriptions:  .  aspirin EC 81 MG EC tablet, Take 1 tablet (81 mg total) by mouth daily., Disp: , Rfl:  .  celecoxib (CELEBREX) 200 MG capsule, Take 200 mg by mouth daily., Disp: , Rfl:  .  cholecalciferol (VITAMIN D) 1000 units tablet, Take 2,000 Units by mouth 2 (two) times daily., Disp: , Rfl:  .  estradiol (ESTRACE) 0.5 MG tablet, Take 0.5 mg by mouth daily., Disp: , Rfl:  .  lisinopril (PRINIVIL,ZESTRIL) 2.5 MG tablet, Take 1 tablet (2.5 mg total) by mouth daily., Disp: 30 tablet, Rfl: 3 .  Metoprolol Tartrate (LOPRESSOR) 100 MG tablet, Take 1 tablet (100 mg total) by mouth 2 (two) times daily., Disp: 60 tablet, Rfl: 3 .  progesterone (PROMETRIUM) 100 MG capsule, Take 100 mg by mouth daily., Disp: , Rfl:   Past Medical History: Past Medical History:  Diagnosis Date  . CAD (coronary artery disease)    a. 01/2016 NSTEMI/Cath: LM 75, LAD 70ost, LCX 50ost, 70p, LPDA 70, OM1 90;  b. 02/10/2016 CABG x 3 (LIMA->LAD, VG->OM1, VG->OM2).  . Ischemic cardiomyopathy    a. 02/02/2016 Echo: EF 35-40% w/ mid-apicalanteroseptal and apical AK, Gr1 DD-->high suspicion for takotsubo;  b. 02/16/2016 Echo (post-cabg): EF 50-55%, midanteroseptal HK, Gr1 DD, mild MR, PASP , triv effusion.  . Osteoarthritis   . PVC's (premature ventricular contractions)    a.  02/2016 Freq pvc's noted post-op.    Tobacco Use: History  Smoking Status  . Never Smoker  Smokeless Tobacco  . Never Used    Labs: Recent Review Flowsheet Data    Labs for ITP Cardiac and Pulmonary Rehab Latest Ref Rng & Units 02/10/2016 02/10/2016 02/10/2016 02/11/2016 04/03/2016   Cholestrol 125 - 200 mg/dL - - - - 409   LDLCALC <811 mg/dL - - - - 83   HDL >=91 mg/dL - - - - 47(W)   Trlycerides <150 mg/dL - - - - 98   Hemoglobin A1c 4.8 - 5.6 % - - - - -   PHART 7.350 - 7.450 - 7.390 7.409 - -   PCO2ART 32.0 - 48.0 mmHg - 38.7 37.1 - -   HCO3 20.0 - 28.0 mmol/L - 23.0 23.5 - -   TCO2 0 - 100 mmol/L 24 24 25 23  -   ACIDBASEDEF 0.0 - 2.0 mmol/L - 1.0 1.0 - -   O2SAT % - 100.0 100.0 - -      Capillary Blood Glucose: Lab Results  Component Value Date   GLUCAP 117 (H) 02/16/2016   GLUCAP 148 (H) 02/16/2016   GLUCAP 141 (H) 02/15/2016   GLUCAP 161 (H) 02/15/2016   GLUCAP 182 (H) 02/15/2016     Exercise Target Goals: Date: 04/25/16  Exercise Program Goal: Individual exercise prescription set with THRR, safety & activity barriers. Participant demonstrates ability to understand and report RPE using BORG scale, to  self-measure pulse accurately, and to acknowledge the importance of the exercise prescription.  Exercise Prescription Goal: Starting with aerobic activity 30 plus minutes a day, 3 days per week for initial exercise prescription. Provide home exercise prescription and guidelines that participant acknowledges understanding prior to discharge.  Activity Barriers & Risk Stratification:     Activity Barriers & Cardiac Risk Stratification - 04/25/16 1503      Activity Barriers & Cardiac Risk Stratification   Activity Barriers Other (comment)   Comments B arthrist pain in hands   Cardiac Risk Stratification High      6 Minute Walk:     6 Minute Walk    Row Name 04/25/16 1648         6 Minute Walk   Phase Initial     Distance 1329 feet     Walk Time 6 minutes      # of Rest Breaks 0     MPH 2.52     METS 2.5     RPE 9     VO2 Peak 8.74     Symptoms No     Resting HR 50 bpm     Resting BP 120/70     Max Ex. HR 68 bpm     Max Ex. BP 120/86     2 Minute Post BP 108/70        Initial Exercise Prescription:     Initial Exercise Prescription - 04/25/16 1600      Date of Initial Exercise RX and Referring Provider   Date 04/25/16   Referring Provider Rollene Rotunda, MD.     Bike   Level 0.4   Minutes 10   METs 2.6     NuStep   Level 1   Minutes 10   METs 2.5     Track   Laps 8   Minutes 10   METs 2.39     Prescription Details   Frequency (times per week) 3   Duration Progress to 30 minutes of continuous aerobic without signs/symptoms of physical distress     Intensity   THRR 40-80% of Max Heartrate 58-116   Ratings of Perceived Exertion 11-13   Perceived Dyspnea 0-4     Progression   Progression Continue to progress workloads to maintain intensity without signs/symptoms of physical distress.     Resistance Training   Training Prescription Yes   Weight 1   Reps 10-12      Perform Capillary Blood Glucose checks as needed.  Exercise Prescription Changes:   Exercise Comments:   Discharge Exercise Prescription (Final Exercise Prescription Changes):   Nutrition:  Target Goals: Understanding of nutrition guidelines, daily intake of sodium 1500mg , cholesterol 200mg , calories 30% from fat and 7% or less from saturated fats, daily to have 5 or more servings of fruits and vegetables.  Biometrics:     Pre Biometrics - 04/25/16 1651      Pre Biometrics   Height 5' 0.25" (1.53 m)   Weight 106 lb 7.7 oz (48.3 kg)   Waist Circumference 32 inches   Hip Circumference 34 inches   Waist to Hip Ratio 0.94 %   BMI (Calculated) 20.7   Triceps Skinfold 27 mm   % Body Fat 35.2 %   Grip Strength 24 kg   Flexibility 10.5 in   Single Leg Stand 7.93 seconds       Nutrition Therapy Plan and Nutrition  Goals:   Nutrition Discharge: Nutrition Scores:   Nutrition Goals Re-Evaluation:  Psychosocial: Target Goals: Acknowledge presence or absence of depression, maximize coping skills, provide positive support system. Participant is able to verbalize types and ability to use techniques and skills needed for reducing stress and depression.  Initial Review & Psychosocial Screening:     Initial Psych Review & Screening - 04/25/16 1609      Family Dynamics   Comments husband, daugher, friends      Barriers   Psychosocial barriers to participate in program There are no identifiable barriers or psychosocial needs.     Screening Interventions   Interventions Encouraged to exercise      Quality of Life Scores:     Quality of Life - 04/25/16 1621      Quality of Life Scores   Health/Function Pre 30 %   Socioeconomic Pre 30 %   Psych/Spiritual Pre 30 %   Family Pre 30 %   GLOBAL Pre 30 %      PHQ-9: Recent Review Flowsheet Data    Depression screen PHQ 2/9 02/24/2016   Decreased Interest 0   Down, Depressed, Hopeless 0   PHQ - 2 Score 0      Psychosocial Evaluation and Intervention:   Psychosocial Re-Evaluation:   Vocational Rehabilitation: Provide vocational rehab assistance to qualifying candidates.   Vocational Rehab Evaluation & Intervention:     Vocational Rehab - 04/25/16 1609      Initial Vocational Rehab Evaluation & Intervention   Assessment shows need for Vocational Rehabilitation No      Education: Education Goals: Education classes will be provided on a weekly basis, covering required topics. Participant will state understanding/return demonstration of topics presented.  Learning Barriers/Preferences:     Learning Barriers/Preferences - 04/25/16 1503      Learning Barriers/Preferences   Learning Barriers Sight   Learning Preferences Written Material;Skilled Demonstration      Education Topics: Count Your Pulse:  -Group instruction  provided by verbal instruction, demonstration, patient participation and written materials to support subject.  Instructors address importance of being able to find your pulse and how to count your pulse when at home without a heart monitor.  Patients get hands on experience counting their pulse with staff help and individually.   Heart Attack, Angina, and Risk Factor Modification:  -Group instruction provided by verbal instruction, video, and written materials to support subject.  Instructors address signs and symptoms of angina and heart attacks.    Also discuss risk factors for heart disease and how to make changes to improve heart health risk factors.   Functional Fitness:  -Group instruction provided by verbal instruction, demonstration, patient participation, and written materials to support subject.  Instructors address safety measures for doing things around the house.  Discuss how to get up and down off the floor, how to pick things up properly, how to safely get out of a chair without assistance, and balance training.   Meditation and Mindfulness:  -Group instruction provided by verbal instruction, patient participation, and written materials to support subject.  Instructor addresses importance of mindfulness and meditation practice to help reduce stress and improve awareness.  Instructor also leads participants through a meditation exercise.    Stretching for Flexibility and Mobility:  -Group instruction provided by verbal instruction, patient participation, and written materials to support subject.  Instructors lead participants through series of stretches that are designed to increase flexibility thus improving mobility.  These stretches are additional exercise for major muscle groups that are typically performed during regular warm up and cool down.  Hands Only CPR Anytime:  -Group instruction provided by verbal instruction, video, patient participation and written materials to  support subject.  Instructors co-teach with AHA video for hands only CPR.  Participants get hands on experience with mannequins.   Nutrition I class: Heart Healthy Eating:  -Group instruction provided by PowerPoint slides, verbal discussion, and written materials to support subject matter. The instructor gives an explanation and review of the Therapeutic Lifestyle Changes diet recommendations, which includes a discussion on lipid goals, dietary fat, sodium, fiber, plant stanol/sterol esters, sugar, and the components of a well-balanced, healthy diet.   Nutrition II class: Lifestyle Skills:  -Group instruction provided by PowerPoint slides, verbal discussion, and written materials to support subject matter. The instructor gives an explanation and review of label reading, grocery shopping for heart health, heart healthy recipe modifications, and ways to make healthier choices when eating out.   Diabetes Question & Answer:  -Group instruction provided by PowerPoint slides, verbal discussion, and written materials to support subject matter. The instructor gives an explanation and review of diabetes co-morbidities, pre- and post-prandial blood glucose goals, pre-exercise blood glucose goals, signs, symptoms, and treatment of hypoglycemia and hyperglycemia, and foot care basics.   Diabetes Blitz:  -Group instruction provided by PowerPoint slides, verbal discussion, and written materials to support subject matter. The instructor gives an explanation and review of the physiology behind type 1 and type 2 diabetes, diabetes medications and rational behind using different medications, pre- and post-prandial blood glucose recommendations and Hemoglobin A1c goals, diabetes diet, and exercise including blood glucose guidelines for exercising safely.    Portion Distortion:  -Group instruction provided by PowerPoint slides, verbal discussion, written materials, and food models to support subject matter. The  instructor gives an explanation of serving size versus portion size, changes in portions sizes over the last 20 years, and what consists of a serving from each food group.   Stress Management:  -Group instruction provided by verbal instruction, video, and written materials to support subject matter.  Instructors review role of stress in heart disease and how to cope with stress positively.     Exercising on Your Own:  -Group instruction provided by verbal instruction, power point, and written materials to support subject.  Instructors discuss benefits of exercise, components of exercise, frequency and intensity of exercise, and end points for exercise.  Also discuss use of nitroglycerin and activating EMS.  Review options of places to exercise outside of rehab.  Review guidelines for sex with heart disease.   Cardiac Drugs I:  -Group instruction provided by verbal instruction and written materials to support subject.  Instructor reviews cardiac drug classes: antiplatelets, anticoagulants, beta blockers, and statins.  Instructor discusses reasons, side effects, and lifestyle considerations for each drug class.   Cardiac Drugs II:  -Group instruction provided by verbal instruction and written materials to support subject.  Instructor reviews cardiac drug classes: angiotensin converting enzyme inhibitors (ACE-I), angiotensin II receptor blockers (ARBs), nitrates, and calcium channel blockers.  Instructor discusses reasons, side effects, and lifestyle considerations for each drug class.   Anatomy and Physiology of the Circulatory System:  -Group instruction provided by verbal instruction, video, and written materials to support subject.  Reviews functional anatomy of heart, how it relates to various diagnoses, and what role the heart plays in the overall system.   Knowledge Questionnaire Score:     Knowledge Questionnaire Score - 04/25/16 1621      Knowledge Questionnaire Score   Pre Score  17/24  Core Components/Risk Factors/Patient Goals at Admission:     Personal Goals and Risk Factors at Admission - 04/25/16 1645      Core Components/Risk Factors/Patient Goals on Admission   Sedentary Yes   Intervention Provide advice, education, support and counseling about physical activity/exercise needs.;Develop an individualized exercise prescription for aerobic and resistive training based on initial evaluation findings, risk stratification, comorbidities and participant's personal goals.   Expected Outcomes Achievement of increased cardiorespiratory fitness and enhanced flexibility, muscular endurance and strength shown through measurements of functional capacity and personal statement of participant.   Increase Strength and Stamina Yes   Intervention Provide advice, education, support and counseling about physical activity/exercise needs.;Develop an individualized exercise prescription for aerobic and resistive training based on initial evaluation findings, risk stratification, comorbidities and participant's personal goals.   Expected Outcomes Achievement of increased cardiorespiratory fitness and enhanced flexibility, muscular endurance and strength shown through measurements of functional capacity and personal statement of participant.   Personal Goal Other Yes   Personal Goal Return to activities she was doing prior to surgery.   Intervention Provide education regarding exercise and physical activity needs to improve functional fitness, so that she return to previous activities.   Expected Outcomes Be able to do activities she was able to do prior to surgery.      Core Components/Risk Factors/Patient Goals Review:    Core Components/Risk Factors/Patient Goals at Discharge (Final Review):    ITP Comments:     ITP Comments    Row Name 04/25/16 1329           ITP Comments Medical Director- Dr. Armanda Magicraci Turner, MD.          Comments: Patient attended orientation  from 1330 to 1530 to review rules and guidelines for program. Completed 6 minute walk test, Intitial ITP, and exercise prescription.  VSS. Telemetry-Sinus Rhythm.  Asymptomatic.Gladstone LighterMaria Keldrick Pomplun, RN,BSN 04/26/2016 2:33 PM

## 2016-05-01 ENCOUNTER — Encounter (HOSPITAL_COMMUNITY)
Admission: RE | Admit: 2016-05-01 | Discharge: 2016-05-01 | Disposition: A | Discharge status: Home / Self Care | Payer: PPO | Source: Ambulatory Visit | Attending: Cardiology | Admitting: Cardiology

## 2016-05-01 DIAGNOSIS — I214 Non-ST elevation (NSTEMI) myocardial infarction: Secondary | ICD-10-CM

## 2016-05-01 DIAGNOSIS — Z951 Presence of aortocoronary bypass graft: Secondary | ICD-10-CM

## 2016-05-01 NOTE — Progress Notes (Signed)
Daily Session Note  Patient Details  Name: Carolyn Burns MRN: [id number] Date of Birth: 1940-07-10 Referring Provider:   Flowsheet Row CARDIAC REHAB PHASE II ORIENTATION from 04/25/2016 in [place]  Referring Provider  Minus Breeding, MD.      Encounter Date: 05/01/2016  Check In:     Session Check In - 05/01/16 1115      Check-In   Location [facility]   Staff Present Maurice Small, RN, Marga Melnick, RN, Luisa Hart, RN, BSN;Joann Rion, RN, BSN   Supervising physician immediately available to respond to emergencies [facility] immediately available   Physician(s) Dr. Cathlean Sauer   Medication changes reported     No   Fall or balance concerns reported    No   Warm-up and Cool-down Performed as group-led instruction   Resistance Training Performed Yes   VAD Patient? No     Pain Assessment   Currently in Pain? No/denies   Multiple Pain Sites No      Capillary Blood Glucose: No results found for this or any previous visit (from the past 24 hour(s)).   Goals Met:  Exercise tolerated well Personal goals reviewed No report of cardiac concerns or symptoms  Goals Unmet:  Not Applicable  Comments:  Pt started full exercise at cardiac rehab today.  Pt tolerated light exercise without difficulty. VSS, telemetry-Sr with inverted T wave, asymptomatic.  Medication list reconciled. Pt denies barriers to medication compliance. Lipitor on hold presently due to elevation of lipid panel.  Pt is to have labwork completed on tomorrow to assess liver function.  PSYCHOSOCIAL ASSESSMENT:  PHQ-0 . Pt exhibits positive coping skills, hopeful outlook with supportive family which includes husband and daughter.  Pt has progressed very well since her surgery and has had no problems. QUALITY OF LIFE SCORE REVIEW  Pt completed Quality of Life survey as a participant in Cardiac Rehab. Scores 21.0 or below are considered low.  Pt scored the following      Quality of Life - 04/25/16 1621      Quality of Life Scores   Health/Function Pre 30 %   Socioeconomic Pre 30 %   Psych/Spiritual Pre 30 %   Family Pre 30 %   GLOBAL Pre 30 %     Pt scored perfect scores. No psychosocial needs identified, no further intervention needed at this time. 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  Pt desires to build strength and to return to activities she was doing prior to surgery. Will monitor pt progress toward achieving this goal.  Pt oriented to exercise equipment and routine.  Understanding verbalized. [name] Elana Alm, BSN     .   Dr. Fransico Him is Medical Director for Cardiac Rehab at [facility].

## 2016-05-03 ENCOUNTER — Encounter (HOSPITAL_COMMUNITY)
Admission: RE | Admit: 2016-05-03 | Discharge: 2016-05-03 | Disposition: A | Discharge status: Home / Self Care | Payer: PPO | Source: Ambulatory Visit | Attending: Cardiology | Admitting: Cardiology

## 2016-05-03 DIAGNOSIS — Z951 Presence of aortocoronary bypass graft: Secondary | ICD-10-CM | POA: Diagnosis not present

## 2016-05-03 DIAGNOSIS — I214 Non-ST elevation (NSTEMI) myocardial infarction: Secondary | ICD-10-CM

## 2016-05-05 ENCOUNTER — Other Ambulatory Visit: Payer: Self-pay

## 2016-05-05 ENCOUNTER — Encounter (HOSPITAL_COMMUNITY): Payer: PPO

## 2016-05-05 DIAGNOSIS — R945 Abnormal results of liver function studies: Secondary | ICD-10-CM

## 2016-05-05 DIAGNOSIS — E440 Moderate protein-calorie malnutrition: Secondary | ICD-10-CM

## 2016-05-05 DIAGNOSIS — I214 Non-ST elevation (NSTEMI) myocardial infarction: Secondary | ICD-10-CM

## 2016-05-05 DIAGNOSIS — R7989 Other specified abnormal findings of blood chemistry: Secondary | ICD-10-CM | POA: Diagnosis not present

## 2016-05-05 LAB — HEPATIC FUNCTION PANEL
ALT: 23 U/L (ref 6–29)
AST: 19 U/L (ref 10–35)
Albumin: 4.2 g/dL (ref 3.6–5.1)
Alkaline Phosphatase: 79 U/L (ref 33–130)
Bilirubin, Direct: 0.1 mg/dL (ref <=0.2)
Indirect Bilirubin: 0.6 mg/dL (ref 0.2–1.2)
Total Bilirubin: 0.7 mg/dL (ref 0.2–1.2)
Total Protein: 6.6 g/dL (ref 6.1–8.1)

## 2016-05-08 ENCOUNTER — Encounter (HOSPITAL_COMMUNITY): Payer: PPO

## 2016-05-10 ENCOUNTER — Encounter (HOSPITAL_COMMUNITY): Payer: PPO

## 2016-05-11 ENCOUNTER — Encounter (HOSPITAL_COMMUNITY): Payer: Self-pay | Admitting: *Deleted

## 2016-05-11 ENCOUNTER — Telehealth: Payer: Self-pay | Admitting: Nurse Practitioner

## 2016-05-11 NOTE — Telephone Encounter (Signed)
New message ° ° ° ° ° ° °Calling to get lab results °

## 2016-05-11 NOTE — Telephone Encounter (Signed)
Returned call to patient.Recent lab results given. 

## 2016-05-11 NOTE — Progress Notes (Signed)
Cardiac Individual Treatment Plan  Patient Details  Name: Carolyn Burns MRN: [id number] Date of Birth: 1940/07/31 Referring Provider:   Flowsheet Row CARDIAC REHAB PHASE [facility]  Referring Provider  Rollene RotundaHochrein, James, MD.      Initial Encounter Date:  Flowsheet Row CARD[facility]AC RE[facility]  Date  04/25/16  Referring Provider  Rollene RotundaHochrein, James, MD.      Visit Diagnosis: No diagnosis found.  Patient's Home Medications on Admission:  Current Outpatient Prescriptions:  .  aspirin EC 81 MG EC tablet, Take 1 tablet (81 mg total) by mouth daily., Disp: , Rfl:  .  celecoxib (CELEBREX) 200 MG capsule, Take 200 mg by mouth daily., Disp: , Rfl:  .  cholecalciferol (VITAMIN D) 1000 units tablet, Take 2,000 Units by mouth 2 (two) times daily., Disp: , Rfl:  .  estradiol (ESTRACE) 0.5 MG tablet, Take 0.5 mg by mouth daily., Disp: , Rfl:  .  lisinopril (PRINIVIL,ZESTRIL) 2.5 MG tablet, Take 1 tablet (2.5 mg total) by mouth daily., Disp: 30 tablet, Rfl: 3 .  Metoprolol Tartrate (LOPRESSOR) 100 MG tablet, Take 1 tablet (100 mg total) by mouth 2 (two) times daily., Disp: 60 tablet, Rfl: 3 .  progesterone (PROMETRIUM) 100 MG capsule, Take 100 mg by mouth daily., Disp: , Rfl:   Past Medical History: Past Medical History:  Diagnosis Date  . CAD (coronary artery disease)    a. 01/2016 NSTEMI/Cath: LM 75, LAD 70ost, LCX 50ost, 70p, LPDA 70, OM1 90;  b. 02/10/2016 CABG x 3 (LIMA->LAD, VG->OM1, VG->OM2).  . Ischemic cardiomyopathy    a. 02/02/2016 Echo: EF 35-40% w/ mid-apicalanteroseptal and apical AK, Gr1 DD-->high suspicion for takotsubo;  b. 02/16/2016 Echo (post-cabg): EF 50-55%, midanteroseptal HK, Gr1 DD, mild MR, PASP 33mmHg, triv effusion.  . Osteoarthritis   . PVC's (premature ventricular contractions)    a. 02/2016 Freq pvc's noted post-op.    Tobacco  Use: History  Smoking Status  . Never Smoker  Smokeless Tobacco  . Never Used    Labs: Recent Review Flowsheet Data    Labs for ITP Cardiac and Pulmonary Rehab Latest Ref Rng & Units 02/10/2016 02/10/2016 02/10/2016 02/11/2016 04/03/2016   Cholestrol 125 - 200 mg/dL - - - - 409141   LDLCALC <811<130 mg/dL - - - - 83   HDL >=91>=46 mg/dL - - - - 47(W38(L)   Trlycerides <150 mg/dL - - - - 98   Hemoglobin A1c 4.8 - 5.6 % - - - - -   PHART 7.350 - 7.450 - 7.390 7.409 - -   PCO2ART 32.0 - 48.0 mmHg - 38.7 37.1 - -   HCO3 20.0 - 28.0 mmol/L - 23.0 23.5 - -   TCO2 0 - 100 mmol/L 24 24 25 23  -   ACIDBASEDEF 0.0 - 2.0 mmol/L - 1.0 1.0 - -   O2SAT % - 100.0 100.0 - -      Capillary Blood Glucose: Lab Results  Component Value Date   GLUCAP 117 (H) 02/16/2016   GLUCAP 148 (H) 02/16/2016   GLUCAP 141 (H) 02/15/2016   GLUCAP 161 (H) 02/15/2016   GLUCAP 182 (H) 02/15/2016     Exercise Target Goals:    Exercise Program Goal: Individual exercise prescription set with THRR, safety & activity barriers. Participant demonstrates ability to understand and report RPE using BORG scale, to self-measure pulse accurately, and to acknowledge the importance  of the exercise prescription.  Exercise Prescription Goal: Starting with aerobic activity 30 plus minutes a day, 3 days per week for initial exercise prescription. Provide home exercise prescription and guidelines that participant acknowledges understanding prior to discharge.  Activity Barriers & Risk Stratification:     Activity Barriers & Cardiac Risk Stratification - 04/25/16 1503      Activity Barriers & Cardiac Risk Stratification   Activity Barriers Other (comment)   Comments B arthrist pain in hands   Cardiac Risk Stratification High      6 Minute Walk:     6 Minute Walk    Row Name 04/25/16 1648         6 Minute Walk   Phase Initial     Distance 1329 feet     Walk Time 6 minutes     # of Rest Breaks 0     MPH 2.52     METS 2.5     RPE  9     VO2 Peak 8.74     Symptoms No     Resting HR 50 bpm     Resting BP 120/70     Max Ex. HR 68 bpm     Max Ex. BP 120/86     2 Minute Post BP 108/70        Initial Exercise Prescription:     Initial Exercise Prescription - 04/25/16 1600      Date of Initial Exercise RX and Referring Provider   Date 04/25/16   Referring Provider Rollene Rotunda, MD.     Bike   Level 0.4   Minutes 10   METs 2.6     NuStep   Level 1   Minutes 10   METs 2.5     Track   Laps 8   Minutes 10   METs 2.39     Prescription Details   Frequency (times per week) 3   Duration Progress to 30 minutes of continuous aerobic without signs/symptoms of physical distress     Intensity   THRR 40-80% of Max Heartrate 58-116   Ratings of Perceived Exertion 11-13   Perceived Dyspnea 0-4     Progression   Progression Continue to progress workloads to maintain intensity without signs/symptoms of physical distress.     Resistance Training   Training Prescription Yes   Weight 1   Reps 10-12      Perform Capillary Blood Glucose checks as needed.  Exercise Prescription Changes:     Exercise Prescription Changes    Row Name 05/09/16 1200             Exercise Review   Progression Yes         Response to Exercise   Blood Pressure (Admit) 106/49       Blood Pressure (Exercise) 124/60       Blood Pressure (Exit) 114/72       Heart Rate (Admit) 57 bpm       Heart Rate (Exercise) 80 bpm       Heart Rate (Exit) 56 bpm       Rating of Perceived Exertion (Exercise) 11       Symptoms nond       Duration Progress to 30 minutes of continuous aerobic without signs/symptoms of physical distress       Intensity THRR unchanged         Progression   Progression Continue progressive overload as per policy without signs/symptoms or physical distress.  Average METs 2.3         Resistance Training   Training Prescription Yes       Weight 2lbs       Reps 10-12         Bike   Level 0.4        Minutes 10       METs 2.54         NuStep   Level 1       Minutes 10       METs 1.9         Track   Laps 11       Minutes 10       METs 2.92          Exercise Comments:     Exercise Comments    Row Name 05/09/16 1216           Exercise Comments There are no changes to current Ex.Rx will continue to [facility] exercise progression          Discharge Exercise Prescription (Final Exercise Prescription Changes):     Exercise Prescription Changes - 05/09/16 1200      Exercise Review   Progression Yes     Response to Exercise   Blood Pressure (Admit) 106/49   Blood Pressure (Exercise) 124/60   Blood Pressure (Exit) 114/72   Heart Rate (Admit) 57 bpm   Heart Rate (Exercise) 80 bpm   Heart Rate (Exit) 56 bpm   Rating of Perceived Exertion (Exercise) 11   Symptoms nond   Duration Progress to 30 minutes of continuous aerobic without signs/symptoms of physical distress   Intensity THRR unchanged     Progression   Progression Continue progressive overload as per policy without signs/symptoms or physical distress.   Average METs 2.3     Resistance Training   Training Prescription Yes   Weight 2lbs   Reps 10-12     Bike   Level 0.4   Minutes 10   METs 2.54     NuStep   Level 1   Minutes 10   METs 1.9     Track   Laps 11   Minutes 10   METs 2.92      Nutrition:  Target Goals: Understanding of nutrition guidelines, daily intake of sodium 1500mg , cholesterol 200mg , calories 30% from fat and 7% or less from saturated fats, daily to have 5 or more servings of fruits and vegetables.  Biometrics:     Pre Biometrics - 04/25/16 1651      Pre Biometrics   Height 5' 0.25" (1.53 m)   Weight 106 lb 7.7 oz (48.3 kg)   Waist Circumference 32 inches   Hip Circumference 34 inches   Waist to Hip Ratio 0.94 %   BMI (Calculated) 20.7   Triceps Skinfold 27 mm   % Body Fat 35.2 %   Grip Strength 24 kg   Flexibility 10.5 in   Single Leg Stand 7.93 seconds        Nutrition Therapy Plan and Nutrition Goals:     Nutrition Therapy & Goals - 05/10/16 1347      Nutrition Therapy   Diet Therapeutic Lifestyle Changes      Personal Nutrition Goals   Personal Goal #1 Maintain weight around 106-109 lb at graduation from Cardiac Rehab     Intervention Plan   Intervention Prescribe, educate and counsel regarding individualized specific dietary modifications aiming towards targeted core components such as weight, hypertension, lipid  management, diabetes, heart failure and other comorbidities.   Expected Outcomes Short Term Goal: Understand basic principles of dietary content, such as calories, fat, sodium, cholesterol and nutrients.;Long Term Goal: Adherence to prescribed nutrition plan.      Nutrition Discharge: Nutrition Scores:     Nutrition Assessments - 05/10/16 1347      MEDFICTS Scores   Pre Score 36      Nutrition Goals Re-Evaluation:   Psychosocial: Target Goals: Acknowledge presence or absence of depression, maximize coping skills, provide positive support system. Participant is able to verbalize types and ability to use techniques and skills needed for reducing stress and depression.  Initial Review & Psychosocial Screening:     Initial Psych Review & Screening - 05/11/16 1507      Family Dynamics   Good Support System? Yes   Comments husband, daugher, friends       Quality of Life Scores:     Quality of Life - 04/25/16 1621      Quality of Life Scores   Health/Function Pre 30 %   Socioeconomic Pre 30 %   Psych/Spiritual Pre 30 %   Family Pre 30 %   GLOBAL Pre 30 %      PHQ-9: Recent Review Flowsheet Data    Depression screen [facility] 2/9 05/01/2016 02/24/2016   Decreased Interest 0 0   Down, Depressed, Hopeless 0 0   PHQ - 2 Score 0 0      Psychosocial Evaluation and Intervention:     Psychosocial Evaluation - 05/11/16 1507      Psychosocial Evaluation & Interventions   Interventions Encouraged to  exercise with the program and follow exercise prescription   Continued Psychosocial Services Needed No     Discharge Psychosocial Assessment & Intervention   Discharge Continue support measures as needed      Psychosocial Re-Evaluation:     Psychosocial Re-Evaluation    Row Name 05/11/16 1507             Psychosocial Re-Evaluation   Interventions Encouraged to attend Cardiac Rehabilitation for the exercise;Relaxation education;Stress management education       Continued Psychosocial Services Needed No          Vocational Rehabilitation: Provide vocational rehab assistance to qualifying candidates.   Vocational Rehab Evaluation & Intervention:     Vocational Rehab - 04/25/16 1609      Initial Vocational Rehab Evaluation & Intervention   Assessment shows need for Vocational Rehabilitation No      Education: Education Goals: Education classes will be provided on a weekly basis, covering required topics. Participant will state understanding/return demonstration of topics presented.  Learning Barriers/Preferences:     Learning Barriers/Preferences - 04/25/16 1503      Learning Barriers/Preferences   Learning Barriers Sight   Learning Preferences Written Material;Skilled Demonstration      Education Topics: Count Your Pulse:  -Group instruction provided by verbal instruction, demonstration, patient participation and written materials to support subject.  Instructors address importance of being able to find your pulse and how to count your pulse when at home without a heart monitor.  Patients get hands on experience counting their pulse with staff help and individually.   Heart Attack, Angina, and Risk Factor Modification:  -Group instruction provided by verbal instruction, video, and written materials to support subject.  Instructors address signs and symptoms of angina and heart attacks.    Also discuss risk factors for heart disease and how to make changes to  improve heart  health risk factors.   Functional Fitness:  -Group instruction provided by verbal instruction, demonstration, patient participation, and written materials to support subject.  Instructors address safety measures for doing things around the house.  Discuss how to get up and down off the floor, how to pick things up properly, how to safely get out of a chair without assistance, and balance training.   Meditation and Mindfulness:  -Group instruction provided by verbal instruction, patient participation, and written materials to support subject.  Instructor addresses importance of mindfulness and meditation practice to help reduce stress and improve awareness.  Instructor also leads participants through a meditation exercise.    Stretching for Flexibility and Mobility:  -Group instruction provided by verbal instruction, patient participation, and written materials to support subject.  Instructors lead participants through series of stretches that are designed to increase flexibility thus improving mobility.  These stretches are additional exercise for major muscle groups that are typically performed during regular warm up and cool down.   Hands Only CPR Anytime:  -Group instruction provided by verbal instruction, video, patient participation and written materials to support subject.  Instructors co-teach with AHA video for hands only CPR.  Participants get hands on experience with mannequins.   Nutrition I class: Heart Healthy Eating:  -Group instruction provided by PowerPoint slides, verbal discussion, and written materials to support subject matter. The instructor gives an explanation and review of the Therapeutic Lifestyle Changes diet recommendations, which includes a discussion on lipid goals, dietary fat, sodium, fiber, plant stanol/sterol esters, sugar, and the components of a well-balanced, healthy diet.   Nutrition II class: Lifestyle Skills:  -Group instruction provided by  PowerPoint slides, verbal discussion, and written materials to support subject matter. The instructor gives an explanation and review of label reading, grocery shopping for heart health, heart healthy recipe modifications, and ways to make healthier choices when eating out.   Diabetes Question & Answer:  -Group instruction provided by PowerPoint slides, verbal discussion, and written materials to support subject matter. The instructor gives an explanation and review of diabetes co-morbidities, pre- and post-prandial blood glucose goals, pre-exercise blood glucose goals, signs, symptoms, and treatment of hypoglycemia and hyperglycemia, and foot care basics.   Diabetes Blitz:  -Group instruction provided by PowerPoint slides, verbal discussion, and written materials to support subject matter. The instructor gives an explanation and review of the physiology behind type 1 and type 2 diabetes, diabetes medications and rational behind using different medications, pre- and post-prandial blood glucose recommendations and Hemoglobin A1c goals, diabetes diet, and exercise including blood glucose guidelines for exercising safely.    Portion Distortion:  -Group instruction provided by PowerPoint slides, verbal discussion, written materials, and food models to support subject matter. The instructor gives an explanation of serving size versus portion size, changes in portions sizes over the last 20 years, and what consists of a serving from each food group. Flowsheet Row CARDIAC REHAB PHASE II EXERC[facility]  Date  05/03/16 [Holiday Eating Survival Tips]  Educator  RD  Instruction Review Code  2- meets goals/outcomes      Stress Management:  -Group instruction provided by verbal instruction, video, and written materials to support subject matter.  Instructors review role of stress in heart disease and how to cope with stress positively.     Exercising  on Your Own:  -Group instruction provided by verbal instruction, power point, and written materials to support subject.  Instructors discuss benefits of exercise,  components of exercise, frequency and intensity of exercise, and end points for exercise.  Also discuss use of nitroglycerin and activating EMS.  Review options of places to exercise outside of rehab.  Review guidelines for sex with heart disease.   Cardiac Drugs I:  -Group instruction provided by verbal instruction and written materials to support subject.  Instructor reviews cardiac drug classes: antiplatelets, anticoagulants, beta blockers, and statins.  Instructor discusses reasons, side effects, and lifestyle considerations for each drug class.   Cardiac Drugs II:  -Group instruction provided by verbal instruction and written materials to support subject.  Instructor reviews cardiac drug classes: angiotensin converting enzyme inhibitors (ACE-I), angiotensin II receptor blockers (ARBs), nitrates, and calcium channel blockers.  Instructor discusses reasons, side effects, and lifestyle considerations for each drug class.   Anatomy and Physiology of the Circulatory System:  -Group instruction provided by verbal instruction, video, and written materials to support subject.  Reviews functional anatomy of heart, how it relates to various diagnoses, and what role the heart plays in the overall system.   Knowledge Questionnaire Score:     Knowledge Questionnaire Score - 04/25/16 1621      Knowledge Questionnaire Score   Pre Score 17/24      Core Components/Risk Factors/Patient Goals at Admission:     Personal Goals and Risk Factors at Admission - 04/25/16 1645      Core Components/Risk Factors/Patient Goals on Admission   Sedentary Yes   Intervention Provide advice, education, support and counseling about physical activity/exercise needs.;Develop an individualized exercise prescription for aerobic and resistive training based  on initial evaluation findings, risk stratification, comorbidities and participant's personal goals.   Expected Outcomes Achievement of increased cardiorespiratory fitness and enhanced flexibility, muscular endurance and strength shown through measurements of functional capacity and personal statement of participant.   Increase Strength and Stamina Yes   Intervention Provide advice, education, support and counseling about physical activity/exercise needs.;Develop an individualized exercise prescription for aerobic and resistive training based on initial evaluation findings, risk stratification, comorbidities and participant's personal goals.   Expected Outcomes Achievement of increased cardiorespiratory fitness and enhanced flexibility, muscular endurance and strength shown through measurements of functional capacity and personal statement of participant.   Personal Goal Other Yes   Personal Goal Return to activities she was doing prior to surgery.   Intervention Provide education regarding exercise and physical activity needs to improve functional fitness, so that she return to previous activities.   Expected Outcomes Be able to do activities she was able to do prior to surgery.      Core Components/Risk Factors/Patient Goals Review:    Core Components/Risk Factors/Patient Goals at Discharge (Final Review):    ITP Comments:     ITP Comments    Row Name 04/25/16 1329           ITP Comments Medical Director- Dr. Armanda Magic, MD.          Comments:  Pt is making expected progress toward personal goals after completing 3 sessions. Pt is out this week due to stomach bug and hopes to return to exercise on next week. Recommend continued exercise and life style modification education including  stress management and relaxation techniques to decrease cardiac risk profile. Alanson Aly, BSN

## 2016-05-12 ENCOUNTER — Encounter (HOSPITAL_COMMUNITY)
Admission: RE | Admit: 2016-05-12 | Discharge: 2016-05-12 | Disposition: A | Discharge status: Home / Self Care | Payer: PPO | Source: Ambulatory Visit | Attending: Cardiology | Admitting: Cardiology

## 2016-05-12 DIAGNOSIS — Z951 Presence of aortocoronary bypass graft: Secondary | ICD-10-CM | POA: Insufficient documentation

## 2016-05-12 DIAGNOSIS — I2510 Atherosclerotic heart disease of native coronary artery without angina pectoris: Secondary | ICD-10-CM | POA: Insufficient documentation

## 2016-05-12 DIAGNOSIS — Z79899 Other long term (current) drug therapy: Secondary | ICD-10-CM | POA: Insufficient documentation

## 2016-05-12 DIAGNOSIS — Z7982 Long term (current) use of aspirin: Secondary | ICD-10-CM | POA: Insufficient documentation

## 2016-05-12 DIAGNOSIS — I252 Old myocardial infarction: Secondary | ICD-10-CM | POA: Insufficient documentation

## 2016-05-12 DIAGNOSIS — M1990 Unspecified osteoarthritis, unspecified site: Secondary | ICD-10-CM | POA: Insufficient documentation

## 2016-05-15 ENCOUNTER — Encounter (HOSPITAL_COMMUNITY): Admission: RE | Admit: 2016-05-15 | Payer: PPO | Source: Ambulatory Visit

## 2016-05-17 ENCOUNTER — Encounter (HOSPITAL_COMMUNITY): Payer: PPO

## 2016-05-19 ENCOUNTER — Encounter (HOSPITAL_COMMUNITY): Payer: PPO

## 2016-05-22 ENCOUNTER — Encounter (HOSPITAL_COMMUNITY): Payer: PPO

## 2016-05-24 ENCOUNTER — Encounter (HOSPITAL_COMMUNITY): Payer: PPO

## 2016-05-25 ENCOUNTER — Ambulatory Visit: Payer: PPO | Admitting: Cardiology

## 2016-05-25 ENCOUNTER — Encounter (HOSPITAL_COMMUNITY): Payer: Self-pay | Admitting: *Deleted

## 2016-05-25 NOTE — Progress Notes (Signed)
Discharge Summary  Patient Details  Name: Carolyn Burns MRN: [id number] Date of Birth: 1940/10/26 Referring Provider:   Flowsheet Row CARDIAC REHAB PHASE II ORIENTATION from 04/25/2016 in [facility] CARDIAC [facility]  Referring Provider  Rollene Rotunda, MD.       Number of Visits: 3  Reason for Discharge:  Early Exit:  Personal  Smoking History:  History  Smoking Status  . Never Smoker  Smokeless Tobacco  . Never Used    Diagnosis:  02/10/16 CABG  ADL UCSD:   Initial Exercise Prescription:     Initial Exercise Prescription - 04/25/16 1600      Date of Initial Exercise RX and Referring Provider   Date 04/25/16   Referring Provider Rollene Rotunda, MD.     Bike   Level 0.4   Minutes 10   METs 2.6     NuStep   Level 1   Minutes 10   METs 2.5     Track   Laps 8   Minutes 10   METs 2.39     Prescription Details   Frequency (times per week) 3   Duration Progress to 30 minutes of continuous aerobic without signs/symptoms of physical distress     Intensity   THRR 40-80% of Max Heartrate 58-116   Ratings of Perceived Exertion 11-13   Perceived Dyspnea 0-4     Progression   Progression Continue to progress workloads to maintain intensity without signs/symptoms of physical distress.     Resistance Training   Training Prescription Yes   Weight 1   Reps 10-12      Discharge Exercise Prescription (Final Exercise Prescription Changes):     Exercise Prescription Changes - 05/09/16 1200      Exercise Review   Progression Yes     Response to Exercise   Blood Pressure (Admit) 106/49   Blood Pressure (Exercise) 124/60   Blood Pressure (Exit) 114/72   Heart Rate (Admit) 57 bpm   Heart Rate (Exercise) 80 bpm   Heart Rate (Exit) 56 bpm   Rating of Perceived Exertion (Exercise) 11   Symptoms nond   Duration Progress to 30 minutes of continuous aerobic without signs/symptoms of physical distress   Intensity THRR unchanged     Progression   Progression Continue progressive overload as per policy without signs/symptoms or physical distress.   Average METs 2.3     Resistance Training   Training Prescription Yes   Weight 2lbs   Reps 10-12     Bike   Level 0.4   Minutes 10   METs 2.54     NuStep   Level 1   Minutes 10   METs 1.9     Track   Laps 11   Minutes 10   METs 2.92      Functional Capacity:     6 Minute Walk    Row Name 04/25/16 1648         6 Minute Walk   Phase Initial     Distance 1329 feet     Walk Time 6 minutes     # of Rest Breaks 0     MPH 2.52     METS 2.5     RPE 9     VO2 Peak 8.74     Symptoms No     Resting HR 50 bpm     Resting BP 120/70     Max Ex. HR 68 bpm     Max Ex. BP  120/86     2 Minute Post BP 108/70        Psychological, QOL, Others - Outcomes: PHQ 2/9: Depression screen [facility] 05/01/2016 02/24/2016  Decreased Interest 0 0  Down, Depressed, Hopeless 0 0  PHQ - 2 Score 0 0    Quality of Life:     Quality of Life - 04/25/16 1621      Quality of Life Scores   Health/Function Pre 30 %   Socioeconomic Pre 30 %   Psych/Spiritual Pre 30 %   Family Pre 30 %   GLOBAL Pre 30 %      Personal Goals: Goals established at orientation with interventions provided to work toward goal.     Personal Goals and Risk Factors at Admission - 04/25/16 1645      Core Components/Risk Factors/Patient Goals on Admission   Sedentary Yes   Intervention Provide advice, education, support and counseling about physical activity/exercise needs.;Develop an individualized exercise prescription for aerobic and resistive training based on initial evaluation findings, risk stratification, comorbidities and participant's personal goals.   Expected Outcomes Achievement of increased cardiorespiratory fitness and enhanced flexibility, muscular endurance and strength shown through measurements of functional capacity and personal statement of participant.   Increase Strength  and Stamina Yes   Intervention Provide advice, education, support and counseling about physical activity/exercise needs.;Develop an individualized exercise prescription for aerobic and resistive training based on initial evaluation findings, risk stratification, comorbidities and participant's personal goals.   Expected Outcomes Achievement of increased cardiorespiratory fitness and enhanced flexibility, muscular endurance and strength shown through measurements of functional capacity and personal statement of participant.   Personal Goal Other Yes   Personal Goal Return to activities she was doing prior to surgery.   Intervention Provide education regarding exercise and physical activity needs to improve functional fitness, so that she return to previous activities.   Expected Outcomes Be able to do activities she was able to do prior to surgery.       Personal Goals Discharge:   Nutrition & Weight - Outcomes:     Pre Biometrics - 04/25/16 1651      Pre Biometrics   Height 5' 0.25" (1.53 m)   Weight 106 lb 7.7 oz (48.3 kg)   Waist Circumference 32 inches   Hip Circumference 34 inches   Waist to Hip Ratio 0.94 %   BMI (Calculated) 20.7   Triceps Skinfold 27 mm   % Body Fat 35.2 %   Grip Strength 24 kg   Flexibility 10.5 in   Single Leg Stand 7.93 seconds         Post Biometrics - 05/22/16 0942       Post  Biometrics   Weight (P)  107 lb 9.4 oz (48.8 kg)      Nutrition:     Nutrition Therapy & Goals - 05/10/16 1347      Nutrition Therapy   Diet Therapeutic Lifestyle Changes      Personal Nutrition Goals   Personal Goal #1 Maintain weight around 106-109 lb at graduation from Cardiac Rehab     Intervention Plan   Intervention Prescribe, educate and counsel regarding individualized specific dietary modifications aiming towards targeted core components such as weight, hypertension, lipid management, diabetes, heart failure and other comorbidities.   Expected Outcomes  Short Term Goal: Understand basic principles of dietary content, such as calories, fat, sodium, cholesterol and nutrients.;Long Term Goal: Adherence to prescribed nutrition plan.      Nutrition Discharge:  Nutrition Assessments - 05/10/16 1347      MEDFICTS Scores   Pre Score 36      Education Questionnaire Score:     Knowledge Questionnaire Score - 04/25/16 1621      Knowledge Questionnaire Score   Pre Score 17/24      Pt discharged from cardiac rehab program today with completion of 3 exercise sessions in Phase II. Pt called and cited personal family issues that will prevent her from participating in cardiac rehab. Unable to reconcile medications, no post exercise assessment completed. Unsure of plans for continuing home exercise. Due to the short period of time in the cardiac rehab program, pt would benefit from additional educational resources toward making lasting healthy lifestyle changes and reduction in modifiable CAD risk factors. Alanson Alyarlette Heavenleigh Petruzzi RN, BSN

## 2016-05-26 ENCOUNTER — Encounter (HOSPITAL_COMMUNITY): Payer: PPO

## 2016-05-31 ENCOUNTER — Encounter (HOSPITAL_COMMUNITY): Payer: PPO

## 2016-06-02 ENCOUNTER — Encounter (HOSPITAL_COMMUNITY): Payer: PPO

## 2016-06-07 ENCOUNTER — Encounter (HOSPITAL_COMMUNITY): Payer: PPO

## 2016-06-09 ENCOUNTER — Encounter (HOSPITAL_COMMUNITY): Payer: PPO

## 2016-06-12 ENCOUNTER — Encounter (HOSPITAL_COMMUNITY): Payer: PPO

## 2016-06-14 ENCOUNTER — Encounter (HOSPITAL_COMMUNITY): Payer: PPO

## 2016-06-16 ENCOUNTER — Encounter (HOSPITAL_COMMUNITY): Payer: PPO

## 2016-06-19 ENCOUNTER — Other Ambulatory Visit: Payer: Self-pay | Admitting: Cardiology

## 2016-06-19 ENCOUNTER — Encounter (HOSPITAL_COMMUNITY): Payer: PPO

## 2016-06-19 ENCOUNTER — Other Ambulatory Visit: Payer: Self-pay | Admitting: Physician Assistant

## 2016-06-21 ENCOUNTER — Encounter (HOSPITAL_COMMUNITY): Payer: PPO

## 2016-06-23 ENCOUNTER — Encounter (HOSPITAL_COMMUNITY): Payer: PPO

## 2016-06-26 ENCOUNTER — Encounter (HOSPITAL_COMMUNITY): Payer: PPO

## 2016-06-28 ENCOUNTER — Other Ambulatory Visit: Payer: Self-pay | Admitting: Physician Assistant

## 2016-06-28 ENCOUNTER — Encounter (HOSPITAL_COMMUNITY): Payer: PPO

## 2016-06-30 ENCOUNTER — Encounter (HOSPITAL_COMMUNITY): Payer: PPO

## 2016-07-03 ENCOUNTER — Encounter (HOSPITAL_COMMUNITY): Payer: PPO

## 2016-07-05 ENCOUNTER — Encounter (HOSPITAL_COMMUNITY): Payer: PPO

## 2016-07-07 ENCOUNTER — Encounter (HOSPITAL_COMMUNITY): Payer: PPO

## 2016-07-10 ENCOUNTER — Encounter (INDEPENDENT_AMBULATORY_CARE_PROVIDER_SITE_OTHER): Payer: Self-pay

## 2016-07-10 ENCOUNTER — Encounter (HOSPITAL_COMMUNITY): Payer: PPO

## 2016-07-10 ENCOUNTER — Ambulatory Visit (INDEPENDENT_AMBULATORY_CARE_PROVIDER_SITE_OTHER): Payer: PPO | Admitting: Cardiology

## 2016-07-10 VITALS — BP 126/70 | HR 60 | Ht 60.25 in | Wt 109.0 lb

## 2016-07-10 DIAGNOSIS — Z789 Other specified health status: Secondary | ICD-10-CM

## 2016-07-10 DIAGNOSIS — I214 Non-ST elevation (NSTEMI) myocardial infarction: Secondary | ICD-10-CM

## 2016-07-10 DIAGNOSIS — Z951 Presence of aortocoronary bypass graft: Secondary | ICD-10-CM | POA: Diagnosis not present

## 2016-07-10 DIAGNOSIS — E782 Mixed hyperlipidemia: Secondary | ICD-10-CM

## 2016-07-10 DIAGNOSIS — I493 Ventricular premature depolarization: Secondary | ICD-10-CM

## 2016-07-10 DIAGNOSIS — I2510 Atherosclerotic heart disease of native coronary artery without angina pectoris: Secondary | ICD-10-CM

## 2016-07-10 NOTE — Progress Notes (Signed)
Office Visit    Patient Name: Carolyn Burns Date of Encounter: 07/10/2016  Primary Care Provider:  Gweneth Dimitri, MD Primary Cardiologist:  J. Hochrein, MD   Chief Complaint    76 year old female status post recent non-STEMI and CABG who presents for follow-up.  Past Medical History    Past Medical History:  Diagnosis Date  . CAD (coronary artery disease)    a. 01/2016 NSTEMI/Cath: LM 75, LAD 70ost, LCX 50ost, 70p, LPDA 70, OM1 90;  b. 02/10/2016 CABG x 3 (LIMA->LAD, VG->OM1, VG->OM2).  . Ischemic cardiomyopathy    a. 02/02/2016 Echo: EF 35-40% w/ mid-apicalanteroseptal and apical AK, Gr1 DD-->high suspicion for takotsubo;  b. 02/16/2016 Echo (post-cabg): EF 50-55%, midanteroseptal HK, Gr1 DD, mild MR, PASP , triv effusion.  . Osteoarthritis   . PVC's (premature ventricular contractions)    a. 02/2016 Freq pvc's noted post-op.   Past Surgical History:  Procedure Laterality Date  . CARDIAC CATHETERIZATION N/A 02/01/2016   Procedure: Left Heart Cath and Coronary Angiography;  Surgeon: Lennette Bihari, MD;  Location: [facility] INVASIVE CV LAB;  Service: Cardiovascular;  Laterality: N/A;  . CORONARY ARTERY BYPASS GRAFT N/A 02/10/2016   Procedure: CORONARY ARTERY BYPASS GRAFTING (CABG)x 3 with endoscopic harvesting of right saphenous vein;  Surgeon: [name], MD;  Location[facility] OR;  Service: Open Heart Surgery;  Laterality: N/A;  . TEE WITHOUT CARDIOVERSION N/A 02/10/2016   Procedure: TRANSESOPHAGEAL ECHOCARDIOGRAM (TEE);  Surgeon: [name], MD;  Location: [facility] OR;  Service: Open Heart Surgery;  Laterality: N/A;    Allergies  Allergies  Allergen Reactions  . Aleve [Naproxen Sodium] Itching and Other (See Comments)    Reaction:  Redness on palms of hands/bottom of feet   . Codeine Nausea Only    History of Present Illness    76 year old female with the above complex past medical history. She was admitted to [facility] in late August with chest pain and ruled in for  non-STEMI. She underwent catheterization revealing severe left main, ostial LAD, proximal circumflex, and first obtuse marginal disease. EF was initially depressed at 35-40% with suggestion of taco soup or cardiomyopathy superimposed on ischemic cardiomyopathy. She was placed on medical therapy and evaluated by thoracic surgery with recommendation for coronary artery bypass grafting once she had some improvement in ejection fraction. Initially, there was a plan to have her go home and come back for outpatient follow-up, however follow-up echo showed improvement in LV function and decision was made to pursue bypass while hospitalized. This took place on September 7. She had a relatively uneventful post bypass hospital course though she did have frequent PVCs requiring titration of her beta blocker.  Since d/c, she has done quite well.  She has had only minimal chest wall pain and has not required any prn pain meds.  She denies angina or dyspnea.  She has been ambulating some around the house as recommended by the inpt cardiac rehab team, and she has not been noticing any significant limitations.  She did have a small area along her sternal scar, which was draining and she saw Dr. Donata Clay and was placed on Keflex (she believes that's what she's taking - I can't find a record of this in Epic).  Since then, the area has looked better.  She denies palpitations, pnd, orthopnea, n, v, dizziness, syncope, edema, weight gain, or early satiety. She does plan on enrolling in cardiac rehab once cleared to do so.  07/10/2016 - patient is coming  after 6 months, she has completed cardiac rehabilitation and feels great. She denies any chest pain shortness of breath palpitation claudication or syncope. No lower extremity edema orthopnea or paroxysmal nocturnal dyspnea. She developed significant myalgias after taking Lipitor with significant elevation of LFTs and eventually Lipitor was discontinued. Her LFTs normalized 2 weeks  after she discontinued Lipitor. She hasn't been taking any cholesterol medicine since then.  Home Medications    Prior to Admission medications   Medication Sig Start Date End Date Taking? Authorizing Provider  aspirin EC 81 MG EC tablet Take 1 tablet (81 mg total) by mouth daily. 02/17/16  Yes Erin R Barrett, PA-C  atorvastatin (LIPITOR) 40 MG tablet Take 1 tablet (40 mg total) by mouth daily at 6 PM. 02/16/16  Yes Erin R Barrett, PA-C  celecoxib (CELEBREX) 200 MG capsule Take 200 mg by mouth daily.   Yes Historical Provider, MD  cholecalciferol (VITAMIN D) 1000 units tablet Take 2,000 Units by mouth 2 (two) times daily.   Yes Historical Provider, MD  estradiol (ESTRACE) 0.5 MG tablet Take 0.5 mg by mouth daily.   Yes Historical Provider, MD  lisinopril (PRINIVIL,ZESTRIL) 2.5 MG tablet Take 1 tablet (2.5 mg total) by mouth daily. 02/17/16  Yes Erin R Barrett, PA-C  Metoprolol Tartrate (LOPRESSOR) 100 MG tablet Take 1 tablet (100 mg total) by mouth 2 (two) times daily. 02/16/16  Yes Erin R Barrett, PA-C  progesterone (PROMETRIUM) 100 MG capsule Take 100 mg by mouth daily.   Yes Historical Provider, MD    Review of Systems    As above, doing well w/o chest pain, dyspnea, pnd, orthopnea, n, v, dizziness, syncope, edema, or early satiety.  All other systems reviewed and are otherwise negative except as noted above.  Physical Exam    VS:  BP 126/70   Pulse 60   Ht 5' 0.25" (1.53 m)   Wt 109 lb (49.4 kg)   SpO2 96%   BMI 21.11 kg/m  , BMI Body mass index is 21.11 kg/m. GEN: Well nourished, well developed, in no acute distress.  HEENT: normal.  Neck: Supple, no JVD, carotid bruits, or masses. Cardiac: RRR, no murmurs, rubs, or gallops. No clubbing, cyanosis, edema. R lower leg surgical site is healing well w/o erythema or drainage. Radials/DP/PT 2+ and equal bilaterally.  Sternal scar is healing well w/ a small area of very mild erythema without drainage midway down along the right sternal  border. Respiratory:  Respirations regular and unlabored, clear to auscultation bilaterally. GI: Soft, nontender, nondistended, BS + x 4. Abd chest tube sites are healing well w/o erythema or drainage. MS: no deformity or atrophy. Skin: warm and dry, no rash. Neuro:  AAOx3.  Strength and sensation are intact. Psych: Normal affect.  Accessory Clinical Findings    ECG - Regular sinus rhythm, 85, anterolateral T-wave inversion with subtle ST elevation in lead 3-similar to prior ECGs.  Assessment & Plan    1.  Non-ST segment elevation myocardial infarction, subsequent care/coronary artery disease: Status post recent admission and subsequent coronary artery bypass grafting 3. She completed cardiac rehabilitation and is very active and asymptomatic.  Continue aspirin, beta blocker, and ACE inhibitor therapy.  Intolerant to statins with myalgias and LFTs elevations we'll refer to lipid clinic.  2. Takotsubo Cardiomyopathy/ICM:  Ms. Carolyn Burns was noted to have LV dysfunction upon admission with recovery of LV function prior to discharge. She is euvolemic on exam and her weight has been stable at home. She remains on beta  blocker and ACE inhibitor therapy. LVEF has improved from 35-40% to 5055% just 2 weeks after surgery.  3. PVCs: Patient was noted to have a fair amount of PVCs burden prior to discharge. No symptoms of palpitations or syncope.  4. HL:  LDL 127 during recent admission.  As about intolerant to Lipitor with myalgias and LFTs elevation we will refer to lipid clinic.   Follow up in 6 months.  Tobias Alexander, MD 07/10/2016, 8:40 AM

## 2016-07-10 NOTE — Patient Instructions (Addendum)
Medication Instructions:  Your physician recommends that you continue on your current medications as directed. Please refer to the Current Medication list given to you today.  Labwork: NONE  Testing/Procedures: NONE  Follow-Up: Your physician wants you to follow-up in: 6 months with Dr. Delton SeeNelson. You will receive a reminder letter in the mail two months in advance. If you don't receive a letter, please call our office to schedule the follow-up appointment.  You have been referred to the lipid clinic for statin intolerance.    If you need a refill on your cardiac medications before your next appointment, please call your pharmacy.

## 2016-07-12 ENCOUNTER — Encounter (HOSPITAL_COMMUNITY): Payer: PPO

## 2016-07-14 ENCOUNTER — Encounter (HOSPITAL_COMMUNITY): Payer: PPO

## 2016-07-17 ENCOUNTER — Encounter (HOSPITAL_COMMUNITY): Payer: PPO

## 2016-07-19 ENCOUNTER — Encounter (HOSPITAL_COMMUNITY): Payer: PPO

## 2016-07-19 ENCOUNTER — Ambulatory Visit: Payer: PPO

## 2016-07-21 ENCOUNTER — Encounter (HOSPITAL_COMMUNITY): Payer: PPO

## 2016-07-24 ENCOUNTER — Encounter (HOSPITAL_COMMUNITY): Payer: PPO

## 2016-07-26 ENCOUNTER — Encounter (HOSPITAL_COMMUNITY): Payer: PPO

## 2016-07-26 NOTE — Progress Notes (Signed)
Patient ID: Carolyn Burns                 DOB: March 30, 1941                    MRN: [id number]     HPI: Carolyn Burns is a 76 y.o. female patient referred to lipid clinic by Dr Delton SeeNelson. PMH is significant for recent NSTEMI 01/2016 s/p CABG x3 vessels and PVCs. Pt was started on Lipitor and developed significant myalgias and elevated LFTs. Lipitor was discontinued and her LFTs normalized 2 weeks after discontinuation of Lipitor. She has not tried any other cholesterol medication since then.  Pt does not report any problems tolerating Lipitor other than LFT increase. Her ALT increased from 15 to 40 to 87 while on Lipitor therapy. Pt was advised to hold Lipitor for 2 weeks on 11/14. 2 weeks later on 12/1, ALT had normalized to 23. She has not tried another statin since but states that her husband has taken Lipitor since 1999 without trouble. Her baseline LDL was 140 prior to statin initiation. Pt has questions regarding nutrition labels and foods that she should be eating to help lower her cholesterol.  Current Medications: krill oil 500mg  daily Intolerances: Lipitor 40mg  daily - myalgias and elevated LFTs Risk Factors: CAD s/p NSTEMI and CABG LDL goal: 70mg /dL  Diet: Eats chicken and salmon. Tries to limit fat in her diet. Snacking on nuts  Exercise: Walks frequently.   Family History: Pt was adopted and does not know any personal family history. Both of her adoptive parents have passed.  Social History: Denies tobacco, alcohol, and illicit drug use.  Labs: 04/03/2016: TC 141, TG 98, HDL 38, LDL 83 (on Lipitor 40mg  daily - has since d/ced) 02/01/16: TC 200, TG 131, HDL 34, LDL 140 (no therapy)  Past Medical History:  Diagnosis Date  . CAD (coronary artery disease)    a. 01/2016 NSTEMI/Cath: LM 75, LAD 70ost, LCX 50ost, 70p, LPDA 70, OM1 90;  b. 02/10/2016 CABG x 3 (LIMA->LAD, VG->OM1, VG->OM2).  . Ischemic cardiomyopathy    a. 02/02/2016 Echo: EF 35-40% w/ mid-apicalanteroseptal and  apical AK, Gr1 DD-->high suspicion for takotsubo;  b. 02/16/2016 Echo (post-cabg): EF 50-55%, midanteroseptal HK, Gr1 DD, mild MR, PASP 33mmHg, triv effusion.  . Osteoarthritis   . PVC's (premature ventricular contractions)    a. 02/2016 Freq pvc's noted post-op.    Current Outpatient Prescriptions on File Prior to Visit  Medication Sig Dispense Refill  . aspirin EC 81 MG EC tablet Take 1 tablet (81 mg total) by mouth daily.    . celecoxib (CELEBREX) 200 MG capsule Take 200 mg by mouth daily.    . cholecalciferol (VITAMIN D) 1000 units tablet Take 2,000 Units by mouth 2 (two) times daily.    Marland Kitchen. estradiol (ESTRACE) 0.5 MG tablet Take 0.5 mg by mouth daily.    Marland Kitchen. lisinopril (PRINIVIL,ZESTRIL) 2.5 MG tablet TAKE 1 TABLET BY MOUTH EVERY DAY 30 tablet 3  . Metoprolol Tartrate (LOPRESSOR) 100 MG tablet Take 1 tablet (100 mg total) by mouth 2 (two) times daily. 60 tablet 3  . progesterone (PROMETRIUM) 100 MG capsule Take 100 mg by mouth daily.     No current facility-administered medications on file prior to visit.     Allergies  Allergen Reactions  . Aleve [Naproxen Sodium] Itching and Other (See Comments)    Reaction:  Redness on palms of hands/bottom of feet   . Codeine Nausea Only  Assessment/Plan:  1. Hyperlipidemia - Baseline LDL is 140 above goal <70 given history of ASCVD. Most recent lipid panel shows LDL of 83 while pt was taking Lipitor therapy, however she has since discontinued due to elevated LFTs. Will rechallenge with low dose Crestor 10mg  daily and recheck LFTs in 2 weeks. If stable, will plan to increase Crestor to 20mg  with another LFT check 2 weeks after.   Pt signed informed consent for [facility] lipid registry.   Megan E. Supple, PharmD, CPP, BCACP [facility] [address] [place], [place] [address] Phone: [phone]; Fax: [phone] 07/27/2016 8:50 AM

## 2016-07-27 ENCOUNTER — Ambulatory Visit (INDEPENDENT_AMBULATORY_CARE_PROVIDER_SITE_OTHER): Payer: PPO | Admitting: Pharmacist

## 2016-07-27 VITALS — BP 126/64 | HR 45 | Ht 61.0 in | Wt 108.0 lb

## 2016-07-27 DIAGNOSIS — E785 Hyperlipidemia, unspecified: Secondary | ICD-10-CM | POA: Diagnosis not present

## 2016-07-27 MED ORDER — ROSUVASTATIN CALCIUM 10 MG PO TABS: 10.0000 mg | ORAL_TABLET | Freq: Every day | ORAL | 11 refills | Status: DC

## 2016-07-27 NOTE — Patient Instructions (Addendum)
Start taking Crestor (rosuvastatin) 10mg  once a day for your cholesterol.  Recheck liver enzymes in 2 weeks on Thursday March 8th. Come in any time after 7:30am.

## 2016-07-28 ENCOUNTER — Encounter (HOSPITAL_COMMUNITY): Payer: PPO

## 2016-07-31 ENCOUNTER — Encounter (HOSPITAL_COMMUNITY): Payer: PPO

## 2016-08-02 ENCOUNTER — Encounter (HOSPITAL_COMMUNITY): Payer: PPO

## 2016-08-04 ENCOUNTER — Encounter: Payer: Self-pay | Admitting: *Deleted

## 2016-08-04 ENCOUNTER — Encounter (HOSPITAL_COMMUNITY): Payer: PPO

## 2016-08-04 NOTE — Progress Notes (Unsigned)
Late entry  Subject met inclusion and exclusion criteria. The informed consent form, study requirements and expectations were reviewed with the subject and questions and concerns were addressed prior to the signing of the consent form. The subject verbalized understanding of the trail requirements. The subject agreed to participate in the [place]and signed the informed consent. The informed consent was obtained prior to performance of any protocol-specific procedures for the subject. A copy of the signed informed consent was given to the subject and a copy was placed in the subject's medical record.  Jake Bathe, RN 07/27/2016 0830

## 2016-08-07 ENCOUNTER — Encounter (HOSPITAL_COMMUNITY): Payer: PPO

## 2016-08-09 ENCOUNTER — Encounter (HOSPITAL_COMMUNITY): Payer: PPO

## 2016-08-10 ENCOUNTER — Other Ambulatory Visit: Payer: PPO | Admitting: *Deleted

## 2016-08-10 DIAGNOSIS — E785 Hyperlipidemia, unspecified: Secondary | ICD-10-CM | POA: Diagnosis not present

## 2016-08-10 LAB — HEPATIC FUNCTION PANEL
ALT: 11 IU/L (ref 0–32)
AST: 16 IU/L (ref 0–40)
Albumin: 4.2 g/dL (ref 3.5–4.8)
Alkaline Phosphatase: 51 IU/L (ref 39–117)
Bilirubin Total: 0.6 mg/dL (ref 0.0–1.2)
Bilirubin, Direct: 0.18 mg/dL (ref 0.00–0.40)
Total Protein: 6.5 g/dL (ref 6.0–8.5)

## 2016-08-11 ENCOUNTER — Telehealth: Payer: Self-pay | Admitting: Pharmacist

## 2016-08-11 ENCOUNTER — Encounter (HOSPITAL_COMMUNITY): Payer: PPO

## 2016-08-11 DIAGNOSIS — E785 Hyperlipidemia, unspecified: Secondary | ICD-10-CM

## 2016-08-11 MED ORDER — ROSUVASTATIN CALCIUM 20 MG PO TABS: 20.0000 mg | ORAL_TABLET | Freq: Every day | ORAL | 11 refills | Status: DC

## 2016-08-11 NOTE — Telephone Encounter (Signed)
Spoke with pt regarding LFTs - at last office visit, she started Crestor 10mg  daily with f/u LFTs given previous hx of elevated LFTs on Lipitor. Her LFTs are excellent since starting Crestor. Will plan to increase Crestor to 20mg  daily and recheck LFTs in another 2 weeks to ensure they remain stable. Will check lipids in 2 months. Previous LDL was 83 when pt took Lipitor 40mg  so I'm expecting that Crestor 20mg  should bring her LDL very close to goal. Pt is aware of plan.

## 2016-08-14 ENCOUNTER — Encounter (HOSPITAL_COMMUNITY): Payer: PPO

## 2016-08-16 ENCOUNTER — Encounter (HOSPITAL_COMMUNITY): Payer: PPO

## 2016-08-18 ENCOUNTER — Encounter (HOSPITAL_COMMUNITY): Payer: PPO

## 2016-08-21 ENCOUNTER — Encounter (HOSPITAL_COMMUNITY): Payer: PPO

## 2016-08-23 ENCOUNTER — Encounter (HOSPITAL_COMMUNITY): Payer: PPO

## 2016-08-25 ENCOUNTER — Encounter (HOSPITAL_COMMUNITY): Payer: PPO

## 2016-08-28 ENCOUNTER — Encounter (HOSPITAL_COMMUNITY): Payer: PPO

## 2016-08-30 ENCOUNTER — Encounter (INDEPENDENT_AMBULATORY_CARE_PROVIDER_SITE_OTHER): Payer: Self-pay

## 2016-08-30 ENCOUNTER — Other Ambulatory Visit: Payer: PPO | Admitting: *Deleted

## 2016-08-30 DIAGNOSIS — E785 Hyperlipidemia, unspecified: Secondary | ICD-10-CM | POA: Diagnosis not present

## 2016-08-30 LAB — HEPATIC FUNCTION PANEL
ALT: 9 IU/L (ref 0–32)
AST: 16 IU/L (ref 0–40)
Albumin: 4.4 g/dL (ref 3.5–4.8)
Alkaline Phosphatase: 51 IU/L (ref 39–117)
Bilirubin Total: 0.6 mg/dL (ref 0.0–1.2)
Bilirubin, Direct: 0.18 mg/dL (ref 0.00–0.40)
Total Protein: 6.5 g/dL (ref 6.0–8.5)

## 2016-08-30 LAB — LIPID PANEL
Chol/HDL Ratio: 2.7 ratio units (ref 0.0–4.4)
Cholesterol, Total: 128 mg/dL (ref 100–199)
HDL: 47 mg/dL (ref >39)
LDL Calculated: 66 mg/dL (ref 0–99)
Triglycerides: 75 mg/dL (ref 0–149)
VLDL Cholesterol Cal: 15 mg/dL (ref 5–40)

## 2016-10-04 ENCOUNTER — Other Ambulatory Visit: Payer: PPO

## 2016-10-05 ENCOUNTER — Other Ambulatory Visit: Payer: PPO

## 2016-10-09 ENCOUNTER — Other Ambulatory Visit: Payer: PPO

## 2016-10-24 ENCOUNTER — Other Ambulatory Visit: Payer: PPO

## 2016-10-24 DIAGNOSIS — E785 Hyperlipidemia, unspecified: Secondary | ICD-10-CM

## 2016-10-24 LAB — LIPID PANEL
Chol/HDL Ratio: 2.9 ratio (ref 0.0–4.4)
Cholesterol, Total: 117 mg/dL (ref 100–199)
HDL: 41 mg/dL (ref >39)
LDL Calculated: 54 mg/dL (ref 0–99)
Triglycerides: 112 mg/dL (ref 0–149)
VLDL Cholesterol Cal: 22 mg/dL (ref 5–40)

## 2016-10-24 NOTE — Addendum Note (Signed)
Addended by: Channing MuttersGRACE, Teyton Pattillo J on: 10/24/2016 08:23 AM   Modules accepted: Orders

## 2016-10-24 NOTE — Addendum Note (Signed)
Addended by: Jama FlavorsMINICHELLO, MALLORY L on: 10/24/2016 08:03 AM   Modules accepted: Orders

## 2016-11-11 ENCOUNTER — Other Ambulatory Visit: Payer: Self-pay | Admitting: Cardiology

## 2016-12-25 DIAGNOSIS — Z0000 Encounter for general adult medical examination without abnormal findings: Secondary | ICD-10-CM | POA: Diagnosis not present

## 2016-12-25 DIAGNOSIS — E559 Vitamin D deficiency, unspecified: Secondary | ICD-10-CM | POA: Diagnosis not present

## 2016-12-25 DIAGNOSIS — R5383 Other fatigue: Secondary | ICD-10-CM | POA: Diagnosis not present

## 2016-12-25 DIAGNOSIS — R7301 Impaired fasting glucose: Secondary | ICD-10-CM | POA: Diagnosis not present

## 2016-12-25 DIAGNOSIS — R079 Chest pain, unspecified: Secondary | ICD-10-CM | POA: Diagnosis not present

## 2016-12-25 DIAGNOSIS — Z1389 Encounter for screening for other disorder: Secondary | ICD-10-CM | POA: Diagnosis not present

## 2016-12-25 DIAGNOSIS — Z791 Long term (current) use of non-steroidal anti-inflammatories (NSAID): Secondary | ICD-10-CM | POA: Diagnosis not present

## 2016-12-25 DIAGNOSIS — R1013 Epigastric pain: Secondary | ICD-10-CM | POA: Diagnosis not present

## 2016-12-25 DIAGNOSIS — R0602 Shortness of breath: Secondary | ICD-10-CM | POA: Diagnosis not present

## 2016-12-25 DIAGNOSIS — E785 Hyperlipidemia, unspecified: Secondary | ICD-10-CM | POA: Diagnosis not present

## 2016-12-25 DIAGNOSIS — N951 Menopausal and female climacteric states: Secondary | ICD-10-CM | POA: Diagnosis not present

## 2016-12-25 DIAGNOSIS — M85851 Other specified disorders of bone density and structure, right thigh: Secondary | ICD-10-CM | POA: Diagnosis not present

## 2016-12-27 ENCOUNTER — Other Ambulatory Visit: Payer: Self-pay

## 2016-12-27 MED ORDER — LISINOPRIL 2.5 MG PO TABS: 2.5000 mg | ORAL_TABLET | Freq: Every day | ORAL | 0 refills | Status: DC

## 2016-12-28 DIAGNOSIS — M85851 Other specified disorders of bone density and structure, right thigh: Secondary | ICD-10-CM | POA: Diagnosis not present

## 2016-12-28 DIAGNOSIS — I2510 Atherosclerotic heart disease of native coronary artery without angina pectoris: Secondary | ICD-10-CM | POA: Diagnosis not present

## 2016-12-28 DIAGNOSIS — E785 Hyperlipidemia, unspecified: Secondary | ICD-10-CM | POA: Diagnosis not present

## 2016-12-28 DIAGNOSIS — Z1389 Encounter for screening for other disorder: Secondary | ICD-10-CM | POA: Diagnosis not present

## 2016-12-28 DIAGNOSIS — M545 Low back pain: Secondary | ICD-10-CM | POA: Diagnosis not present

## 2016-12-28 DIAGNOSIS — R7301 Impaired fasting glucose: Secondary | ICD-10-CM | POA: Diagnosis not present

## 2016-12-28 DIAGNOSIS — E559 Vitamin D deficiency, unspecified: Secondary | ICD-10-CM | POA: Diagnosis not present

## 2016-12-28 DIAGNOSIS — R944 Abnormal results of kidney function studies: Secondary | ICD-10-CM | POA: Diagnosis not present

## 2016-12-28 DIAGNOSIS — N951 Menopausal and female climacteric states: Secondary | ICD-10-CM | POA: Diagnosis not present

## 2016-12-28 DIAGNOSIS — Z791 Long term (current) use of non-steroidal anti-inflammatories (NSAID): Secondary | ICD-10-CM | POA: Diagnosis not present

## 2016-12-28 DIAGNOSIS — K219 Gastro-esophageal reflux disease without esophagitis: Secondary | ICD-10-CM | POA: Diagnosis not present

## 2016-12-28 DIAGNOSIS — Z0000 Encounter for general adult medical examination without abnormal findings: Secondary | ICD-10-CM | POA: Diagnosis not present

## 2017-01-15 ENCOUNTER — Encounter: Payer: Self-pay | Admitting: Physician Assistant

## 2017-01-15 ENCOUNTER — Ambulatory Visit (INDEPENDENT_AMBULATORY_CARE_PROVIDER_SITE_OTHER): Payer: PPO | Admitting: Physician Assistant

## 2017-01-15 VITALS — BP 112/60 | HR 44 | Ht 61.0 in | Wt 111.6 lb

## 2017-01-15 DIAGNOSIS — E782 Mixed hyperlipidemia: Secondary | ICD-10-CM

## 2017-01-15 DIAGNOSIS — I255 Ischemic cardiomyopathy: Secondary | ICD-10-CM

## 2017-01-15 DIAGNOSIS — I25810 Atherosclerosis of coronary artery bypass graft(s) without angina pectoris: Secondary | ICD-10-CM

## 2017-01-15 DIAGNOSIS — R001 Bradycardia, unspecified: Secondary | ICD-10-CM | POA: Diagnosis not present

## 2017-01-15 DIAGNOSIS — I493 Ventricular premature depolarization: Secondary | ICD-10-CM | POA: Insufficient documentation

## 2017-01-15 MED ORDER — METOPROLOL TARTRATE 100 MG PO TABS: 100.0000 mg | ORAL_TABLET | Freq: Two times a day (BID) | ORAL | 3 refills | Status: DC

## 2017-01-15 NOTE — Progress Notes (Signed)
Cardiology Office Note    Date:  01/15/2017   ID:  Carolyn Burns, DOB [birth year], MRN [id number]  PCP:  Gweneth DimitriMcNeill, [name], MD  Cardiologist: Dr. Delton SeeNelson  Chief Complaint  Patient presents with  . Follow-up    seen for Dr.Nelson    History of Present Illness:  Carolyn Burns is a 76 y.o. female history of CAD status post NSTEMI 01/2016 followed by CABG 3 with LIMA to the LAD, SVG to OM1, SVG to OM 2. 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 She also has a history of statin intolerance with elevated LFTs and was changed to Crestor earlier this year. Patient also had frequent PVCs treated with beta blocker.  Patient comes in today for six-month follow-up. Overall she is doing well. She walks 20 minutes 3 times a week. 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 She feels like she's getting some energy back over the past 3 or 4 weeks. She denies any chest pain, palpitations, dyspnea, dyspnea on exertion, dizziness or presyncope. Her pulse is 42 today. She says when she checks her blood pressure at home has usually what her heart rate runs. She denies any dizziness or presyncope.    Past Medical History:  Diagnosis Date  . CAD (coronary artery disease)    a. 01/2016 NSTEMI/Cath: LM 75, LAD 70ost, LCX 50ost, 70p, LPDA 70, OM1 90;  b. 02/10/2016 CABG x 3 (LIMA->LAD, VG->OM1, VG->OM2).  . Ischemic cardiomyopathy    a. 02/02/2016 Echo: EF 35-40% w/ mid-apicalanteroseptal and apical AK, Gr1 DD-->high suspicion for takotsubo;  b. 02/16/2016 Echo (post-cabg): EF 50-55%, midanteroseptal HK, Gr1 DD, mild MR, PASP 33mmHg, triv effusion.  . Osteoarthritis   . PVC's (premature ventricular contractions)    a. 02/2016 Freq pvc's noted post-op.    Past Surgical History:  Procedure Laterality Date  . CARDIAC CATHETERIZATION N/A 02/01/2016   Procedure: Left Heart Cath and  Coronary Angiography;  Surgeon: Lennette Biharihomas A Kelly, MD;  [facility];  Service: Cardiovascular;  Laterality: N/A;  . CORONARY ARTERY BYPASS GRAFT N/A 02/10/2016   Procedure: CORONARY ARTERY BYPASS GRAFTING (CABG)x 3 with endoscopic harvesting of right saphenous vein;  Surgeon: Kerin PernaPeter Van Trigt, MD;  [facility];  Service: Open Heart Surgery;  Laterality: N/A;  . TEE WITHOUT CARDIOVERSION N/A 02/10/2016   Procedure: TRANSESOPHAGEAL ECHOCARDIOGRAM (TEE);  Surgeon: Kerin PernaPeter Van Trigt, MD;  [facility];  Service: Open Heart Surgery;  Laterality: N/A;    Current Medications: Current Meds  Medication Sig  . aspirin EC 81 MG EC tablet Take 1 tablet (81 mg total) by mouth daily.  . celecoxib (CELEBREX) 200 MG capsule Take 200 mg by mouth daily.  . cholecalciferol (VITAMIN D) 1000 units tablet Take 2,000 Units by mouth 2 (two) times daily.  Marland Kitchen. estradiol (ESTRACE) 0.5 MG tablet Take 0.5 mg by mouth daily.  Boris Lown. Krill Oil 500 MG CAPS Take 1 capsule by mouth daily.  Marland Kitchen. lisinopril (PRINIVIL,ZESTRIL) 2.5 MG tablet Take 1 tablet (2.5 mg total) by mouth daily.  . progesterone (PROMETRIUM) 100 MG capsule Take 100 mg by mouth daily.  . ranitidine (ZANTAC) 150 MG tablet Take 150 mg by mouth daily as needed for heartburn.  . rosuvastatin (CRESTOR) 20 MG tablet Take 20 mg by mouth daily.  . [DISCONTINUED] Metoprolol Tartrate (LOPRESSOR) 100 MG tablet Take 1 tablet (100 mg total) by mouth 2 (  two) times daily.     Allergies:   Aleve [naproxen sodium] and Codeine   Social History   Social History  . Marital status: Married    Spouse name: N/A  . Number of children: N/A  . Years of education: N/A   Social History Main Topics  . Smoking status: Never Smoker  . Smokeless tobacco: Never Used  . Alcohol use No  . Drug use: No  . Sexual activity: Not Asked   Other Topics Concern  . None   Social History Narrative  . None     Family History:  The patient's   Adopted  ROS:   Please see the  history of present illness.    Review of Systems  Constitution: Positive for malaise/fatigue.  HENT: Negative.   Eyes: Negative.   Cardiovascular: Negative.   Respiratory: Negative.   Hematologic/Lymphatic: Negative.   Musculoskeletal: Negative.  Negative for joint pain.  Gastrointestinal: Negative.   Genitourinary: Negative.   Neurological: Negative.    All other systems reviewed and are negative.   PHYSICAL EXAM:   VS:  BP 112/60   Pulse (!) 44   Ht 5\' 1"  (1.549 m)   Wt 111 lb 9.6 oz (50.6 kg)   BMI 21.09 kg/m   Physical Exam  GEN: Well nourished, well developed, in no acute distress  Neck: no JVD, carotid bruits, or masses Cardiac:RRR; no murmurs, rubs, or gallops  Respiratory:  clear to auscultation bilaterally, normal work of breathing GI: soft, nontender, nondistended, + BS Ext: without cyanosis, clubbing, or edema, Good distal pulses bilaterally Neuro:  Alert and Oriented x 3 Psych: euthymic mood, full affect  Wt Readings from Last 3 Encounters:  01/15/17 111 lb 9.6 oz (50.6 kg)  07/27/16 108 lb (49 kg)  07/10/16 109 lb (49.4 kg)      Studies/Labs Reviewed:   EKG:  EKG is ordered today.  The ekg ordered today demonstrates Sinus bradycardia 42 bpm  Recent Labs: 02/03/2016: TSH 2.772 02/11/2016: Magnesium 2.4 02/15/2016: BUN 11; Creatinine, Ser 0.67; Potassium 4.2; Sodium 138 02/16/2016: Hemoglobin 11.4; Platelets 408 08/30/2016: ALT 9   Lipid Panel    Component Value Date/Time   CHOL 117 10/24/2016 0823   TRIG 112 10/24/2016 0823   HDL 41 10/24/2016 0823   CHOLHDL 2.9 10/24/2016 0823   CHOLHDL 3.7 04/03/2016 1104   VLDL 20 04/03/2016 1104   LDLCALC 54 10/24/2016 0823    Additional studies/ records that were reviewed today include:  2-D echo 9/2017Study Conclusions   - Left ventricle: The cavity size was normal. Wall thickness was   normal. Systolic function was normal. The estimated ejection   fraction was in the range of 50% to 55%. There is  hypokinesis of   the midanteroseptal myocardium. Doppler parameters are consistent   with abnormal left ventricular relaxation (grade 1 diastolic   dysfunction). - Mitral valve: There was mild regurgitation. - Pulmonary arteries: Systolic pressure was mildly increased. PA   peak pressure: 33 mm Hg (S). - Pericardium, extracardiac: A trivial pericardial effusion was   identified posterior to the heart. There was no evidence of   hemodynamic compromise.   Impressions:   - EF improved (prior 45%)       ASSESSMENT:    1. Coronary artery disease involving coronary bypass graft of native heart without angina pectoris   2. Sinus bradycardia   3. Cardiomyopathy, ischemic   4. Mixed hyperlipidemia   5. PVC's (premature ventricular contractions)  PLAN:  In order of problems listed above:  CAD status post CABG 02/2016 doing well without angina  Sinus bradycardia with heart rate in the 40s. Patient is asymptomatic. We'll decrease metoprolol to 100 mg in the a.m. 50 mg in the evening. Patient will call us with heart rates in 2 weeks. I'll see her back in one month to see if we need to titrate down further.  History of ischemic cardiomyopathy EF improved post CABG LVEF 50-55%.  Mixed hyperlipidemia with statin intolerance, excellent lipid profile 10/2016 on Crestor  PVCs post CABG treated with high-dose beta blockers now with bradycardia in the 40s. Will titrate down.    Medication Adjustments/Labs and Tests Ordered: Current medicines are reviewed at length with the patient today.  Concerns regarding medicines are outlined above.  Medication changes, Labs and Tests ordered today are listed in the Patient Instructions below. Patient Instructions  Medication Instructions:  Your physician has recommended you make the following change in your medication: 1.) DECREASE Lopressor to 100 mg in the morning and 50 mg in the evening.   Labwork: None Ordered   Testing/Procedures: None  Ordered   Follow-Up: Your physician recommends that you schedule a follow-up appointment in: Monday September 10 at 8:15  Any Other Special Instructions Will Be Listed Below (If Applicable).  Please call us within two weeks to let us know what your blood pressure as well as your heart rate has been running.   If you need a refill on your cardiac medications before your next appointment, please call your pharmacy.      Elson Clan, PA-C  01/15/2017 8:43 AM    [facility] [address] [place], [place], [place]  [address] Phone: [phone]; Fax: [phone]

## 2017-01-15 NOTE — Patient Instructions (Addendum)
Medication Instructions:  Your physician has recommended you make the following change in your medication: 1.) DECREASE Lopressor to 100 mg in the morning and 50 mg in the evening.   Labwork: None Ordered   Testing/Procedures: None Ordered   Follow-Up: Your physician recommends that you schedule a follow-up appointment in: Monday September 10 at 8:15  Any Other Special Instructions Will Be Listed Below (If Applicable).  Please call us within two weeks to let us know what your blood pressure as well as your heart rate has been running.   If you need a refill on your cardiac medications before your next appointment, please call your pharmacy.

## 2017-01-22 ENCOUNTER — Other Ambulatory Visit: Payer: Self-pay | Admitting: *Deleted

## 2017-01-22 MED ORDER — ROSUVASTATIN CALCIUM 20 MG PO TABS: 20.0000 mg | ORAL_TABLET | Freq: Every day | ORAL | 3 refills | Status: DC

## 2017-02-08 DIAGNOSIS — I255 Ischemic cardiomyopathy: Secondary | ICD-10-CM | POA: Insufficient documentation

## 2017-02-08 DIAGNOSIS — I2510 Atherosclerotic heart disease of native coronary artery without angina pectoris: Secondary | ICD-10-CM | POA: Insufficient documentation

## 2017-02-08 NOTE — Progress Notes (Signed)
Cardiology Office Note    Date:  02/12/2017   ID:  Carolyn Burns, DOB [birth year], MRN [id number]  PCP:  Gweneth Dimitri, MD  Cardiologist: Dr. Delton See  Chief Complaint  Patient presents with  . Bradycardia    History of Present Illness:  Carolyn Burns is a 76 y.o. female  history of CAD status post NSTEMI 01/2016 followed by CABG 3 with LIMA to the LAD, SVG to OM1, SVG to OM 2. 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 She also has a history of statin intolerance with elevated LFTs and was changed to Crestor earlier this year. Patient also had frequent PVCs treated with beta blocker.  I saw the patient 01/15/17. She was doing well without angina. She had sinus bradycardia with her heart rate in the 40s but was asymptomatic. She was on high-dose metoprolol for PVCs post CABG. I decrease metoprolol to 100 mg in the morning 50 in the evening.  Patient comes in today feeling well. She denies any chest pain, palpitations, dyspnea, dyspnea on exertion, dizziness or presyncope. She's been taking her blood pressures at home and her pulse stays in the 40s. She says it doesn't get below 40. 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 She also walks 20 minutes every other day without difficulty.         Past Medical History:  Diagnosis Date  . CAD (coronary artery disease)    a. 01/2016 NSTEMI/Cath: LM 75, LAD 70ost, LCX 50ost, 70p, LPDA 70, OM1 90;  b. 02/10/2016 CABG x 3 (LIMA->LAD, VG->OM1, VG->OM2).  . Ischemic cardiomyopathy    a. 02/02/2016 Echo: EF 35-40% w/ mid-apicalanteroseptal and apical AK, Gr1 DD-->high suspicion for takotsubo;  b. 02/16/2016 Echo (post-cabg): EF 50-55%, midanteroseptal HK, Gr1 DD, mild MR, PASP , triv effusion.  . Osteoarthritis   . PVC's (premature ventricular contractions)    a. 02/2016 Freq pvc's  noted post-op.    Past Surgical History:  Procedure Laterality Date  . CARDIAC CATHETERIZATION N/A 02/01/2016   Procedure: Left Heart Cath and Coronary Angiography;  Surgeon: Lennette Bihari, MD;  [facility] CV LAB;  Service: Cardiovascular;  Laterality: N/A;  . CORONARY ARTERY BYPASS GRAFT N/A 02/10/2016   Procedure: CORONARY ARTERY BYPASS GRAFTING (CABG)x 3 with endoscopic harvesting of right saphenous vein;  Surgeon: [name], MD;  Location: [facility] OR;  Service: Open Heart Surgery;  Laterality: N/A;  . TEE WITHOUT CARDIOVERSION N/A 02/10/2016   Procedure: TRANSESOPHAGEAL ECHOCARDIOGRAM (TEE);  Surgeon: [name], MD;  Location: [facility];  Service: Open Heart Surgery;  Laterality: N/A;    Current Medications: Current Meds  Medication Sig  . aspirin EC 81 MG EC tablet Take 1 tablet (81 mg total) by mouth daily.  . celecoxib (CELEBREX) 200 MG capsule Take 200 mg by mouth daily.  . cholecalciferol (VITAMIN D) 1000 units tablet Take 2,000 Units by mouth 2 (two) times daily.  Marland Kitchen estradiol (ESTRACE) 0.5 MG tablet Take 0.5 mg by mouth daily.  Boris Lown Oil 500 MG CAPS Take 1 capsule by mouth daily.  Marland Kitchen lisinopril (PRINIVIL,ZESTRIL) 2.5 MG tablet Take 1 tablet (2.5 mg total) by mouth daily.  . metoprolol tartrate (LOPRESSOR) 100 MG tablet Take 1 tablet (100 mg total) by mouth 2 (two) times daily. Take 100 mg in the morning and 50 mg in the evening  .  progesterone (PROMETRIUM) 100 MG capsule Take 100 mg by mouth daily.  . ranitidine (ZANTAC) 150 MG tablet Take 150 mg by mouth daily as needed for heartburn.  . rosuvastatin (CRESTOR) 20 MG tablet Take 1 tablet (20 mg total) by mouth daily.     Allergies:   Aleve [naproxen sodium] and Codeine   Social History   Social History  . Marital status: Married    Spouse name: N/A  . Number of children: N/A  . Years of education: N/A   Social History Main Topics  . Smoking status: Never Smoker  . Smokeless tobacco: Never Used  . Alcohol  use No  . Drug use: No  . Sexual activity: Not on file   Other Topics Concern  . Not on file   Social History Narrative  . No narrative on file     Family History:  The patient's   family history is not on file. She was adopted.   ROS:   Please see the history of present illness.    Review of Systems  Constitution: Negative.  HENT: Negative.   Eyes: Negative.   Cardiovascular: Negative.   Respiratory: Negative.   Hematologic/Lymphatic: Negative.   Musculoskeletal: Negative.  Negative for joint pain.  Gastrointestinal: Negative.   Genitourinary: Negative.   Neurological: Negative.    All other systems reviewed and are negative.   PHYSICAL EXAM:   VS:  BP 116/64   Pulse (!) 44   Ht 5\' 1"  (1.549 m)   Wt 114 lb (51.7 kg)   BMI 21.54 kg/m   Physical Exam  GEN: Well nourished, well developed, in no acute distress  HEENT: normal  Neck: no JVD, carotid bruits, or masses Cardiac:RRR; no murmurs, rubs, or gallops  Respiratory:  clear to auscultation bilaterally, normal work of breathing GI: soft, nontender, nondistended, + BS Ext: without cyanosis, clubbing, or edema, Good distal pulses bilaterally MS: no deformity or atrophy  Skin: warm and dry, no rash Neuro:  Alert and Oriented x 3, Strength and sensation are intact Psych: euthymic mood, full affect  Wt Readings from Last 3 Encounters:  02/12/17 114 lb (51.7 kg)  01/15/17 111 lb 9.6 oz (50.6 kg)  07/27/16 108 lb (49 kg)      Studies/Labs Reviewed:   EKG:  EKG is ordered today.  The ekg ordered today demonstrates Sinus bradycardia at 40 bpm  Recent Labs: 02/15/2016: BUN 11; Creatinine, Ser 0.67; Potassium 4.2; Sodium 138 02/16/2016: Hemoglobin 11.4; Platelets 408 08/30/2016: ALT 9   Lipid Panel    Component Value Date/Time   CHOL 117 10/24/2016 0823   TRIG 112 10/24/2016 0823   HDL 41 10/24/2016 0823   CHOLHDL 2.9 10/24/2016 0823   CHOLHDL 3.7 04/03/2016 1104   VLDL 20 04/03/2016 1104   LDLCALC 54  10/24/2016 0823    Additional studies/ records that were reviewed today include:   2-D echo 9/2017Study Conclusions   - Left ventricle: The cavity size was normal. Wall thickness was   normal. Systolic function was normal. The estimated ejection   fraction was in the range of 50% to 55%. There is hypokinesis of   the midanteroseptal myocardium. Doppler parameters are consistent   with abnormal left ventricular relaxation (grade 1 diastolic   dysfunction). - Mitral valve: There was mild regurgitation. - Pulmonary arteries: Systolic pressure was mildly increased. PA   peak pressure: 33 mm Hg (S). - Pericardium, extracardiac: A trivial pericardial effusion was   identified posterior to the heart. There was  no evidence of   hemodynamic compromise.   Impressions:   - EF improved (prior 45%)            ASSESSMENT:    1. Sinus bradycardia   2. Coronary artery disease involving coronary bypass graft of native heart without angina pectoris   3. PVC's (premature ventricular contractions)   4. Ischemic cardiomyopathy   5. Hyperlipidemia, unspecified hyperlipidemia type      PLAN:  In order of problems listed above:  Sinus bradycardia with heart rate in the 40s last office visit metoprolol decreased to 100 mg morning 40 in the evening.Heart rate still 40 bpm. Patient asymptomatic. She took 100 mg of metoprolol this morning. I will hold this evening's dose. Decrease metoprolol to 50 mg twice a day. I'll see her back next week and titrate down further if needed. Follow-up with Dr. Delton See in 2 months.  CAD status post CABG 02/2016 doing well without angina  PVCs on metoprolol but having to decrease because of bradycardia.  History of ischemic cardiomyopathy EF improved post CABG LVEF 50-55%  Mixed hyperlipidemia with statin intolerance. Excellent lipids 10/2016 on Crestor.    Medication Adjustments/Labs and Tests Ordered: Current medicines are reviewed at length with the  patient today.  Concerns regarding medicines are outlined above.  Medication changes, Labs and Tests ordered today are listed in the Patient Instructions below. Patient Instructions  Medication Instructions:   START TAKING METOPROLOL 50 MG TWICE A DAY   If you need a refill on your cardiac medications before your next appointment, please call your pharmacy.  Labwork:  NONE ORDERED  TODAY   Testing/Procedures: NONE ORDERED  TODAY    Follow-Up: IN ONE TO TWO WEEKS WITH LENZE  IN 2 MONTHS WITH DR NELSON   Any Other Special Instructions Will Be Listed Below (If Applicable).                                                                                                                                                      Elson Clan, PA-C  02/12/2017 8:35 AM    [facility] [address] [place], [place], [place]  [address] Phone: [phone]; Fax: [phone]

## 2017-02-12 ENCOUNTER — Encounter: Payer: Self-pay | Admitting: Physician Assistant

## 2017-02-12 ENCOUNTER — Ambulatory Visit (INDEPENDENT_AMBULATORY_CARE_PROVIDER_SITE_OTHER): Payer: PPO | Admitting: Physician Assistant

## 2017-02-12 VITALS — BP 116/64 | HR 44 | Ht 61.0 in | Wt 114.0 lb

## 2017-02-12 DIAGNOSIS — I493 Ventricular premature depolarization: Secondary | ICD-10-CM | POA: Diagnosis not present

## 2017-02-12 DIAGNOSIS — I25810 Atherosclerosis of coronary artery bypass graft(s) without angina pectoris: Secondary | ICD-10-CM

## 2017-02-12 DIAGNOSIS — E785 Hyperlipidemia, unspecified: Secondary | ICD-10-CM | POA: Diagnosis not present

## 2017-02-12 DIAGNOSIS — I255 Ischemic cardiomyopathy: Secondary | ICD-10-CM

## 2017-02-12 DIAGNOSIS — R001 Bradycardia, unspecified: Secondary | ICD-10-CM

## 2017-02-12 MED ORDER — METOPROLOL TARTRATE 50 MG PO TABS: 50.0000 mg | ORAL_TABLET | Freq: Two times a day (BID) | ORAL | 3 refills | Status: DC

## 2017-02-12 NOTE — Patient Instructions (Signed)
Medication Instructions:   START TAKING METOPROLOL 50 MG TWICE A DAY   If you need a refill on your cardiac medications before your next appointment, please call your pharmacy.  Labwork:  NONE ORDERED  TODAY   Testing/Procedures: NONE ORDERED  TODAY    Follow-Up: IN ONE TO TWO WEEKS WITH LENZE  IN 2 MONTHS WITH DR NELSON   Any Other Special Instructions Will Be Listed Below (If Applicable).

## 2017-02-27 ENCOUNTER — Encounter: Payer: Self-pay | Admitting: Physician Assistant

## 2017-02-27 ENCOUNTER — Ambulatory Visit (INDEPENDENT_AMBULATORY_CARE_PROVIDER_SITE_OTHER): Payer: PPO | Admitting: Physician Assistant

## 2017-02-27 ENCOUNTER — Encounter: Payer: Self-pay | Admitting: Family Medicine

## 2017-02-27 VITALS — BP 102/60 | HR 46 | Resp 16 | Ht 61.0 in | Wt 112.4 lb

## 2017-02-27 DIAGNOSIS — I255 Ischemic cardiomyopathy: Secondary | ICD-10-CM | POA: Diagnosis not present

## 2017-02-27 DIAGNOSIS — I493 Ventricular premature depolarization: Secondary | ICD-10-CM

## 2017-02-27 DIAGNOSIS — R001 Bradycardia, unspecified: Secondary | ICD-10-CM

## 2017-02-27 DIAGNOSIS — I25810 Atherosclerosis of coronary artery bypass graft(s) without angina pectoris: Secondary | ICD-10-CM

## 2017-02-27 DIAGNOSIS — E785 Hyperlipidemia, unspecified: Secondary | ICD-10-CM

## 2017-02-27 MED ORDER — METOPROLOL TARTRATE 25 MG PO TABS: 25.0000 mg | ORAL_TABLET | Freq: Two times a day (BID) | ORAL | 0 refills | Status: DC

## 2017-02-27 NOTE — Patient Instructions (Addendum)
Medication Instructions:  Your physician has recommended you make the following change in your medication:  1.  DECREASE the Metoprolol to 25 mg taking 1 tablet twice a day  Labwork: None ordered  Testing/Procedures: None ordered  Follow-Up: Your physician recommends that you schedule a follow-up appointment in: 03/12/17 WITH MICHELE LENZE, PA-C, ARRIVE AT 11:30  Any Other Special Instructions Will Be Listed Below (If Applicable).   If you need a refill on your cardiac medications before your next appointment, please call your pharmacy.

## 2017-02-27 NOTE — Progress Notes (Signed)
Cardiology Office Note    Date:  02/27/2017   ID:  LOA IDLER, DOB 10-17-1940, MRN [id number]  PCP:  Gweneth Dimitri, MD  Cardiologist: Dr. Delton See  Chief Complaint  Patient presents with  . Follow-up    History of Present Illness:  Carolyn Burns is a 76 y.o. female with history of CAD status post NSTEMI 01/2016 followed by CABG 3 with LIMA to the LAD, SVG to OM1, SVG to OM 2. 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 She also has a history of statin intolerance with elevated LFTs and was changed to Crestor earlier this year. Patient also had frequent PVCs treated with beta blocker.  I saw the patient 01/15/17 and her heart rate was 40 bpm but she was asymptomatic. She was put on high-dose metoprolol for PVCs post CABG. I decreased her metoprolol to 100 mg a morning 50 in the evening. Repeat follow-up 02/12/17 she was still bradycardic in the 40s but she says it never got below 40 at home. I decreased her metoprolol to 50 mg twice a day.  Patient is back for follow-up.She says her heart rate has gotten up into the 50s at times at home. Her blood pressure is still running low for the most part. She denies any dizziness or presyncope. No palpitations.   Past Medical History:  Diagnosis Date  . CAD (coronary artery disease)    a. 01/2016 NSTEMI/Cath: LM 75, LAD 70ost, LCX 50ost, 70p, LPDA 70, OM1 90;  b. 02/10/2016 CABG x 3 (LIMA->LAD, VG->OM1, VG->OM2).  . Ischemic cardiomyopathy    a. 02/02/2016 Echo: EF 35-40% w/ mid-apicalanteroseptal and apical AK, Gr1 DD-->high suspicion for takotsubo;  b. 02/16/2016 Echo (post-cabg): EF 50-55%, midanteroseptal HK, Gr1 DD, mild MR, PASP , triv effusion.  . Osteoarthritis   . PVC's (premature ventricular contractions)    a. 02/2016 Freq pvc's noted post-op.    Past Surgical History:  Procedure Laterality Date  .  CARDIAC CATHETERIZATION N/A 02/01/2016   Procedure: Left Heart Cath and Coronary Angiography;  Surgeon: Lennette Bihari, MD;  Location: [facility] INVASIVE CV LAB;  Service: Cardiovascular;  Laterality: N/A;  . CORONARY ARTERY BYPASS GRAFT N/A 02/10/2016   Procedure: CORONARY ARTERY BYPASS GRAFTING (CABG)x 3 with endoscopic harvesting of right saphenous vein;  Surgeon: [name], MD;  Location: [facility] OR;  Service: Open Heart Surgery;  Laterality: N/A;  . TEE WITHOUT CARDIOVERSION N/A 02/10/2016   Procedure: TRANSESOPHAGEAL ECHOCARDIOGRAM (TEE);  Surgeon: [name], MD;  Location: [facility] OR;  Service: Open Heart Surgery;  Laterality: N/A;    Current Medications: Current Meds  Medication Sig  . aspirin EC 81 MG EC tablet Take 1 tablet (81 mg total) by mouth daily.  . celecoxib (CELEBREX) 200 MG capsule Take 200 mg by mouth daily.  . cholecalciferol (VITAMIN D) 1000 units tablet Take 2,000 Units by mouth 2 (two) times daily.  Marland Kitchen estradiol (ESTRACE) 0.5 MG tablet Take 0.5 mg by mouth daily.  Boris Lown Oil 500 MG CAPS Take 1 capsule by mouth daily.  Marland Kitchen lisinopril (PRINIVIL,ZESTRIL) 2.5 MG tablet Take 1 tablet (2.5 mg total) by mouth daily.  . metoprolol tartrate (LOPRESSOR) 50 MG tablet Take 1 tablet (50 mg total) by mouth 2 (two) times daily.  . progesterone (PROMETRIUM) 100 MG capsule Take 100 mg by mouth daily.  . ranitidine (ZANTAC) 150 MG tablet Take 150 mg by  mouth daily as needed for heartburn.  . rosuvastatin (CRESTOR) 20 MG tablet Take 1 tablet (20 mg total) by mouth daily.     Allergies:   Aleve [naproxen sodium] and Codeine   Social History   Social History  . Marital status: Married    Spouse name: N/A  . Number of children: N/A  . Years of education: N/A   Social History Main Topics  . Smoking status: Never Smoker  . Smokeless tobacco: Never Used  . Alcohol use No  . Drug use: No  . Sexual activity: Not Asked   Other Topics Concern  . None   Social History Narrative  . None       Family History:  The patient's family history is not on file. She was adopted.   ROS:   Please see the history of present illness.    Review of Systems  Constitution: Negative.  HENT: Negative.   Eyes: Negative.   Cardiovascular: Negative.   Respiratory: Negative.   Hematologic/Lymphatic: Negative.   Musculoskeletal: Negative.  Negative for joint pain.  Gastrointestinal: Negative.   Genitourinary: Negative.   Neurological: Negative.    All other systems reviewed and are negative.   PHYSICAL EXAM:   VS:  BP 102/60   Pulse (!) 46   Resp 16   Ht  (1.549 m)   Wt 112 lb 6.4 oz (51 kg)   SpO2 98%   BMI 21.24 kg/m   Physical Exam  GEN: Well nourished, well developed, in no acute distress  Neck: no JVD, carotid bruits, or masses Cardiac:RRR; no murmurs, rubs, or gallops  Respiratory:  clear to auscultation bilaterally, normal work of breathing GI: soft, nontender, nondistended, + BS Ext: without cyanosis, clubbing, or edema, Good distal pulses bilaterally Neuro:  Alert and Oriented x 3 Psych: euthymic mood, full affect  Wt Readings from Last 3 Encounters:  02/27/17 112 lb 6.4 oz (51 kg)  02/12/17 114 lb (51.7 kg)  01/15/17 111 lb 9.6 oz (50.6 kg)      Studies/Labs Reviewed:   EKG:  EKG is  ordered today.  The ekg ordered today demonstrates Sinus bradycardia at 46 bpm  Recent Labs: 08/30/2016: ALT 9   Lipid Panel    Component Value Date/Time   CHOL 117 10/24/2016 0823   TRIG 112 10/24/2016 0823   HDL 41 10/24/2016 0823   CHOLHDL 2.9 10/24/2016 0823   CHOLHDL 3.7 04/03/2016 1104   VLDL 20 04/03/2016 1104   LDLCALC 54 10/24/2016 0823    Additional studies/ records that were reviewed today include:     2-D echo 9/2017Study Conclusions   - Left ventricle: The cavity size was normal. Wall thickness was   normal. Systolic function was normal. The estimated ejection   fraction was in the range of 50% to 55%. There is hypokinesis of   the midanteroseptal  myocardium. Doppler parameters are consistent   with abnormal left ventricular relaxation (grade 1 diastolic   dysfunction). - Mitral valve: There was mild regurgitation. - Pulmonary arteries: Systolic pressure was mildly increased. PA   peak pressure: 33 mm Hg (S). - Pericardium, extracardiac: A trivial pericardial effusion was   identified posterior to the heart. There was no evidence of   hemodynamic compromise.   Impressions:   - EF improved (prior 45%)          ASSESSMENT:    1. Sinus bradycardia   2. PVC's (premature ventricular contractions)   3. Coronary artery disease involving coronary bypass  graft of native heart without angina pectoris   4. Ischemic cardiomyopathy   5. Hyperlipidemia, unspecified hyperlipidemia type      PLAN:  In order of problems listed above:  Sinus bradycardia with heart rate 40 bpm. Decreased metoprolol to 50 mg twice a day. Heart rate has come up to 46 bpm. We'll further titrate down metoprolol to 25 mg twice a day. Follow-up with me in one week for further titration down if needed.  PVCs post CABG was placed on high dose metoprolol but decreasing because of bradycardia. No palpitations or PVCs when here in the office.  CAD status post CABG 02/2016 doing well without angina  Ischemic cardiomyopathy EF improved post CABG LVEF 50-55%  Mixed hyperlipidemia with statin intolerance. On Crestor with excellent lipids 10/2016.    Medication Adjustments/Labs and Tests Ordered: Current medicines are reviewed at length with the patient today.  Concerns regarding medicines are outlined above.  Medication changes, Labs and Tests ordered today are listed in the Patient Instructions below. Patient Instructions  Medication Instructions:  Your physician recommends that you continue on your current medications as directed. Please refer to the Current Medication list given to you today.   Labwork: None ordered  Testing/Procedures: None  ordered  Follow-Up: Your physician recommends that you schedule a follow-up appointment in: KEEP YOUR APPOINTMENT WITH DR. Delton See AS PLANNED   Any Other Special Instructions Will Be Listed Below (If Applicable).   If you need a refill on your cardiac medications before your next appointment, please call your pharmacy.      Elson Clan, PA-C  02/27/2017 12:23 PM    [facility] [address] [place], [place], [place]  [address] Phone: [phone]; Fax: [phone]

## 2017-03-12 ENCOUNTER — Encounter (INDEPENDENT_AMBULATORY_CARE_PROVIDER_SITE_OTHER): Payer: Self-pay

## 2017-03-12 ENCOUNTER — Ambulatory Visit (INDEPENDENT_AMBULATORY_CARE_PROVIDER_SITE_OTHER): Payer: PPO | Admitting: Physician Assistant

## 2017-03-12 ENCOUNTER — Encounter: Payer: Self-pay | Admitting: Physician Assistant

## 2017-03-12 VITALS — BP 104/58 | HR 47 | Ht 61.0 in | Wt 112.8 lb

## 2017-03-12 DIAGNOSIS — E785 Hyperlipidemia, unspecified: Secondary | ICD-10-CM

## 2017-03-12 DIAGNOSIS — R001 Bradycardia, unspecified: Secondary | ICD-10-CM | POA: Diagnosis not present

## 2017-03-12 DIAGNOSIS — I25810 Atherosclerosis of coronary artery bypass graft(s) without angina pectoris: Secondary | ICD-10-CM

## 2017-03-12 DIAGNOSIS — I493 Ventricular premature depolarization: Secondary | ICD-10-CM

## 2017-03-12 DIAGNOSIS — I255 Ischemic cardiomyopathy: Secondary | ICD-10-CM

## 2017-03-12 MED ORDER — METOPROLOL TARTRATE 25 MG PO TABS: 12.5000 mg | ORAL_TABLET | Freq: Two times a day (BID) | ORAL | 0 refills | Status: DC

## 2017-03-12 NOTE — Progress Notes (Signed)
Cardiology Office Note    Date:  03/12/2017   ID:  Carolyn Burns, DOB 08/13/1940, MRN [id number]  PCP:  Gweneth Dimitri, MD  Cardiologist: Dr. Delton See  Chief Complaint  Patient presents with  . Follow-up    History of Present Illness:  Carolyn Burns is a 76 y.o. female with history of CAD status post NSTEMI 01/2016 followed by CABG 3 with LIMA to the LAD, SVG to OM1, SVG to OM 2. 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 She also has a history of statin intolerance with elevated LFTs and was changed to Crestor earlier this year. Patient also had frequent PVCs treated with beta blocker.   I've seen this patient 3 times with bradycardia down in the 40s and have been titrating her beta blocker down.. Last week her heart rate was still 46 so I cut her lopressor back to 25 mg twice a day. Initially she was on high-dose post CABG because of PVCs.  Patient comes in today for follow-up. She says her heart rate got up in the 50s on occasion but then goes right back to 47 bpm. Overall she feels well. Her husband was diagnosed with the tumor in his throat and will be undergoing radiation starting soon.  Past Medical History:  Diagnosis Date  . CAD (coronary artery disease)    a. 01/2016 NSTEMI/Cath: LM 75, LAD 70ost, LCX 50ost, 70p, LPDA 70, OM1 90;  b. 02/10/2016 CABG x 3 (LIMA->LAD, VG->OM1, VG->OM2).  . Ischemic cardiomyopathy    a. 02/02/2016 Echo: EF 35-40% w/ mid-apicalanteroseptal and apical AK, Gr1 DD-->high suspicion for takotsubo;  b. 02/16/2016 Echo (post-cabg): EF 50-55%, midanteroseptal HK, Gr1 DD, mild MR, PASP , triv effusion.  . Osteoarthritis   . PVC's (premature ventricular contractions)    a. 02/2016 Freq pvc's noted post-op.    Past Surgical History:  Procedure Laterality Date  . CARDIAC CATHETERIZATION N/A 02/01/2016   Procedure: Left Heart Cath  and Coronary Angiography;  Surgeon: Lennette Bihari, MD;  Location: [facility] INVASIVE CV LAB;  Service: Cardiovascular;  Laterality: N/A;  . CORONARY ARTERY BYPASS GRAFT N/A 02/10/2016   Procedure: CORONARY ARTERY BYPASS GRAFTING (CABG)x 3 with endoscopic harvesting of right saphenous vein;  Surgeon: [name], MD;  Location: [facility] OR;  Service: Open Heart Surgery;  Laterality: N/A;  . TEE WITHOUT CARDIOVERSION N/A 02/10/2016   Procedure: TRANSESOPHAGEAL ECHOCARDIOGRAM (TEE);  Surgeon: [name], MD;  Location: [facility] OR;  Service: Open Heart Surgery;  Laterality: N/A;    Current Medications: Current Meds  Medication Sig  . aspirin EC 81 MG EC tablet Take 1 tablet (81 mg total) by mouth daily.  . celecoxib (CELEBREX) 200 MG capsule Take 200 mg by mouth daily.  . cholecalciferol (VITAMIN D) 1000 units tablet Take 2,000 Units by mouth daily.   Marland Kitchen estradiol (ESTRACE) 0.5 MG tablet Take 0.5 mg by mouth daily.  Boris Lown Oil 500 MG CAPS Take 1 capsule by mouth daily.  Marland Kitchen lisinopril (PRINIVIL,ZESTRIL) 2.5 MG tablet Take 1 tablet (2.5 mg total) by mouth daily.  . metoprolol tartrate (LOPRESSOR) 25 MG tablet Take 0.5 tablets (12.5 mg total) by mouth 2 (two) times daily.  . progesterone (PROMETRIUM) 100 MG capsule Take 100 mg by mouth daily.  . ranitidine (ZANTAC) 150 MG tablet Take 150 mg by mouth daily as needed for heartburn.  . rosuvastatin (CRESTOR) 20 MG  tablet Take 1 tablet (20 mg total) by mouth daily.  . [DISCONTINUED] metoprolol tartrate (LOPRESSOR) 25 MG tablet Take 1 tablet (25 mg total) by mouth 2 (two) times daily.     Allergies:   Aleve [naproxen sodium] and Codeine   Social History   Social History  . Marital status: Married    Spouse name: N/A  . Number of children: N/A  . Years of education: N/A   Social History Main Topics  . Smoking status: Never Smoker  . Smokeless tobacco: Never Used  . Alcohol use No  . Drug use: No  . Sexual activity: Not Asked   Other Topics Concern  .  None   Social History Narrative  . None     Family History:  The patient's family history is not on file. She was adopted.   ROS:   Please see the history of present illness.    Review of Systems  Constitution: Negative.  HENT: Negative.   Eyes: Negative.   Cardiovascular: Negative.   Respiratory: Negative.   Hematologic/Lymphatic: Negative.   Musculoskeletal: Negative.  Negative for joint pain.  Gastrointestinal: Negative.   Genitourinary: Negative.   Neurological: Negative.    All other systems reviewed and are negative.   PHYSICAL EXAM:   VS:  BP (!) 104/58   Pulse (!) 47   Ht  (1.549 m)   Wt 112 lb 12.8 oz (51.2 kg)   BMI 21.31 kg/m   Physical Exam  GEN: Well nourished, well developed, in no acute distress  Neck: no JVD, carotid bruits, or masses Cardiac:RRR; no murmurs, rubs, or gallops  Respiratory:  clear to auscultation bilaterally, normal work of breathing GI: soft, nontender, nondistended, + BS Ext: without cyanosis, clubbing, or edema, Good distal pulses bilaterally Neuro:  Alert and Oriented x 3 Psych: euthymic mood, full affect  Wt Readings from Last 3 Encounters:  03/12/17 112 lb 12.8 oz (51.2 kg)  02/27/17 112 lb 6.4 oz (51 kg)  02/12/17 114 lb (51.7 kg)      Studies/Labs Reviewed:   EKG:  EKG is  ordered today.  The ekg ordered today demonstrates Sinus bradycardia 47 bpm  Recent Labs: 08/30/2016: ALT 9   Lipid Panel    Component Value Date/Time   CHOL 117 10/24/2016 0823   TRIG 112 10/24/2016 0823   HDL 41 10/24/2016 0823   CHOLHDL 2.9 10/24/2016 0823   CHOLHDL 3.7 04/03/2016 1104   VLDL 20 04/03/2016 1104   LDLCALC 54 10/24/2016 0823    Additional studies/ records that were reviewed today include:   2-D echo 9/2017Study Conclusions   - Left ventricle: The cavity size was normal. Wall thickness was   normal. Systolic function was normal. The estimated ejection   fraction was in the range of 50% to 55%. There is hypokinesis  of   the midanteroseptal myocardium. Doppler parameters are consistent   with abnormal left ventricular relaxation (grade 1 diastolic   dysfunction). - Mitral valve: There was mild regurgitation. - Pulmonary arteries: Systolic pressure was mildly increased. PA   peak pressure: 33 mm Hg (S). - Pericardium, extracardiac: A trivial pericardial effusion was   identified posterior to the heart. There was no evidence of   hemodynamic compromise.   Impressions:   - EF improved (prior 45%)            ASSESSMENT:    1. Sinus bradycardia   2. PVC's (premature ventricular contractions)   3. Coronary artery disease involving coronary bypass  graft of native heart without angina pectoris   4. Ischemic cardiomyopathy   5. Hyperlipidemia, unspecified hyperlipidemia type      PLAN:  In order of problems listed above:  Sinus bradycardia 47 bpm. I've been titrating her metoprolol down slowly. We'll decrease further to 12.5 mg twice a day. She will call me next week with her blood pressure and pulse readings. She has an appointment with Dr. Delton See next month.  PVCs post CABG placed on high-dose beta blocker but have been titrating them down because of significant bradycardia and low blood pressures. No further palpitations.  CAD status post CABG doing well without angina  Ischemic cardiomyopathy ejection fraction improved to 50-55% no evidence of heart failure  Hyperlipidemia on Crestor with excellent lipids 10/2016. She is statin intolerant.    Medication Adjustments/Labs and Tests Ordered: Current medicines are reviewed at length with the patient today.  Concerns regarding medicines are outlined above.  Medication changes, Labs and Tests ordered today are listed in the Patient Instructions below. There are no Patient Instructions on file for this visit.   Elson Clan, PA-C  03/12/2017 12:07 PM    [facility] [address] [place], [place], [place]   [address] Phone: [phone]; Fax: [phone]

## 2017-03-12 NOTE — Patient Instructions (Signed)
Your physician has recommended you make the following change in your medication:  1.) decrease metoprolol to 12.5 mg two times per day  Check your blood pressure and heart rate at home.  Keep a list of readings.    Keep your follow up appointment that is already scheduled with Dr. Delton See.

## 2017-03-20 ENCOUNTER — Ambulatory Visit: Payer: PPO | Admitting: Physician Assistant

## 2017-03-26 ENCOUNTER — Other Ambulatory Visit: Payer: Self-pay | Admitting: Physician Assistant

## 2017-03-28 ENCOUNTER — Telehealth: Payer: Self-pay | Admitting: Physician Assistant

## 2017-03-28 NOTE — Telephone Encounter (Signed)
New message  Patient calling to report HR. States she was told to call and report HR.  Current HR today is 60. Range 54-->60     1) How long have you had palpitations/irregular HR/ Afib? Are you having the symptoms now? NO  2) Are you currently experiencing lightheadedness, SOB or CP? NO  3) Do you have a history of afib (atrial fibrillation) or irregular heart rhythm? NO  4) Have you checked your BP or HR? (document readings if available): 114/70, 123/70, HR 54-->60  5) Are you experiencing any other symptoms? NO

## 2017-03-28 NOTE — Telephone Encounter (Signed)
Pt was instructed by Herma CarsonMichelle Lenze PA-C on 10/8 to log her BP and HR, and report this to our office.  Pts metoprolol was recently titrated down, for sinus brady. Pt was advised by Herma CarsonMichelle Lenze to call our office back within the week, to report her BP and pulse readings, with decrease in Metoprolol. Pts HR has been running anywhere from 54-60 bpm and BP at 114/70, 123/70.  Pt reports being asymptomatic.  Will forward this information to both Herma CarsonMichelle Lenze and Dr Delton SeeNelson, for further review and recommendation if needed.  Will follow-up with the pt accordingly thereafter.

## 2017-03-29 NOTE — Telephone Encounter (Signed)
Notified the pt that Dr Delton SeeNelson reviewed her numbers she provided, and states that both her BP and HR results are good.  Informed the pt that per Dr Delton SeeNelson, she recommends that she continue taking her current med regimen for now.  Pt verbalized understanding and agrees with this plan.

## 2017-03-29 NOTE — Telephone Encounter (Signed)
These are good blood pressure and heart rate results, I would continue the same medication is now.

## 2017-04-06 ENCOUNTER — Encounter: Payer: Self-pay | Admitting: Cardiology

## 2017-04-19 ENCOUNTER — Ambulatory Visit: Payer: PPO | Admitting: Cardiology

## 2017-04-19 ENCOUNTER — Encounter: Payer: Self-pay | Admitting: Cardiology

## 2017-04-19 VITALS — BP 124/70 | HR 76 | Ht 61.0 in | Wt 112.0 lb

## 2017-04-19 DIAGNOSIS — I25810 Atherosclerosis of coronary artery bypass graft(s) without angina pectoris: Secondary | ICD-10-CM | POA: Diagnosis not present

## 2017-04-19 DIAGNOSIS — I10 Essential (primary) hypertension: Secondary | ICD-10-CM | POA: Diagnosis not present

## 2017-04-19 DIAGNOSIS — E785 Hyperlipidemia, unspecified: Secondary | ICD-10-CM | POA: Diagnosis not present

## 2017-04-19 DIAGNOSIS — R001 Bradycardia, unspecified: Secondary | ICD-10-CM | POA: Diagnosis not present

## 2017-04-19 MED ORDER — LISINOPRIL 2.5 MG PO TABS: 2.5000 mg | ORAL_TABLET | Freq: Every day | ORAL | 3 refills | Status: DC

## 2017-04-19 MED ORDER — ROSUVASTATIN CALCIUM 20 MG PO TABS: 20.0000 mg | ORAL_TABLET | Freq: Every day | ORAL | 3 refills | Status: DC

## 2017-04-19 MED ORDER — METOPROLOL TARTRATE 25 MG PO TABS: 12.5000 mg | ORAL_TABLET | Freq: Two times a day (BID) | ORAL | 3 refills | Status: DC

## 2017-04-19 NOTE — Patient Instructions (Signed)

## 2017-04-19 NOTE — Progress Notes (Signed)
Cardiology Office Note    Date:  04/19/2017   ID:  MANAIA Burns, DOB 1940/08/24, MRN [id number]  PCP:  Gweneth Dimitri, MD  Cardiologist: Dr. Delton See  Chief complaint: One-month follow-up  History of Present Illness:  Carolyn Burns is a 76 y.o. female with history of CAD status post NSTEMI 01/2016 followed by CABG 3 with LIMA to the LAD, SVG to OM1, SVG to OM 2. 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 She also has a history of statin intolerance with elevated LFTs and was changed to Crestor earlier this year. Patient also had frequent PVCs treated with beta blocker.   I've seen this patient 3 times with bradycardia down in the 40s and have been titrating her beta blocker down.. Last week her heart rate was still 46 so I cut her lopressor back to 25 mg twice a day. Initially she was on high-dose post CABG because of PVCs.  03/06/2017 -. She says her heart rate got up in the 50s on occasion but then goes right back to 47 bpm. Overall she feels well. Her husband was diagnosed with the tumor in his throat and will be undergoing radiation starting soon.  04/19/2017 - the patient's coming after 1 month, at the last visit she was found to be bradycardic and her metoprolol was decreased to 12.5 mg by mouth twice a day. Today she states that her heart rate has improved she also feels better she denies any dizziness fatigue no falls. She also denies any chest pain shortness of breath. She walks 3 times a week for about 20 minutes. She is completely asymptomatic she is compliant with her medications and has no side effects.  Past Medical History:  Diagnosis Date  . CAD (coronary artery disease)    a. 01/2016 NSTEMI/Cath: LM 75, LAD 70ost, LCX 50ost, 70p, LPDA 70, OM1 90;  b. 02/10/2016 CABG x 3 (LIMA->LAD, VG->OM1, VG->OM2).  . Ischemic cardiomyopathy    a. 02/02/2016 Echo:  EF 35-40% w/ mid-apicalanteroseptal and apical AK, Gr1 DD-->high suspicion for takotsubo;  b. 02/16/2016 Echo (post-cabg): EF 50-55%, midanteroseptal HK, Gr1 DD, mild MR, PASP , triv effusion.  . Osteoarthritis   . PVC's (premature ventricular contractions)    a. 02/2016 Freq pvc's noted post-op.    Past Surgical History:  Procedure Laterality Date  . CARDIAC CATHETERIZATION N/A 02/01/2016   Procedure: Left Heart Cath and Coronary Angiography;  Surgeon: Lennette Bihari, MD;  Location: [facility] INVASIVE CV LAB;  Service: Cardiovascular;  Laterality: N/A;  . CORONARY ARTERY BYPASS GRAFT N/A 02/10/2016   Procedure: CORONARY ARTERY BYPASS GRAFTING (CABG)x 3 with endoscopic harvesting of right saphenous vein;  Surgeon: [name], MD;  Location: [facility] OR;  Service: Open Heart Surgery;  Laterality: N/A;  . TEE WITHOUT CARDIOVERSION N/A 02/10/2016   Procedure: TRANSESOPHAGEAL ECHOCARDIOGRAM (TEE);  Surgeon: [name], MD;  Location: [facility] OR;  Service: Open Heart Surgery;  Laterality: N/A;    Current Medications: Current Meds  Medication Sig  . aspirin EC 81 MG EC tablet Take 1 tablet (81 mg total) by mouth daily.  . celecoxib (CELEBREX) 200 MG capsule Take 200 mg by mouth daily.  . cholecalciferol (VITAMIN D) 1000 units tablet Take 2,000 Units by mouth daily.   Marland Kitchen estradiol (ESTRACE) 0.5 MG tablet Take 0.5 mg by mouth daily.  Boris Lown Oil 500 MG CAPS Take 1 capsule  by mouth daily.  Marland Kitchen. lisinopril (PRINIVIL,ZESTRIL) 2.5 MG tablet Take 1 tablet (2.5 mg total) daily by mouth.  . metoprolol tartrate (LOPRESSOR) 25 MG tablet Take 0.5 tablets (12.5 mg total) 2 (two) times daily by mouth.  . progesterone (PROMETRIUM) 100 MG capsule Take 100 mg by mouth daily.  . ranitidine (ZANTAC) 150 MG tablet Take 150 mg by mouth daily as needed for heartburn.  . rosuvastatin (CRESTOR) 20 MG tablet Take 1 tablet (20 mg total) daily by mouth.  . [DISCONTINUED] lisinopril (PRINIVIL,ZESTRIL) 2.5 MG tablet Take 1 tablet (2.5  mg total) by mouth daily.  . [DISCONTINUED] metoprolol tartrate (LOPRESSOR) 25 MG tablet Take 0.5 tablets (12.5 mg total) by mouth 2 (two) times daily.  . [DISCONTINUED] rosuvastatin (CRESTOR) 20 MG tablet Take 1 tablet (20 mg total) by mouth daily.     Allergies:   Aleve [naproxen sodium] and Codeine   Social History   Socioeconomic History  . Marital status: Married    Spouse name: None  . Number of children: None  . Years of education: None  . Highest education level: None  Social Needs  . Financial resource strain: None  . Food insecurity - worry: None  . Food insecurity - inability: None  . Transportation needs - medical: None  . Transportation needs - non-medical: None  Occupational History  . None  Tobacco Use  . Smoking status: Never Smoker  . Smokeless tobacco: Never Used  Substance and Sexual Activity  . Alcohol use: No  . Drug use: No  . Sexual activity: None  Other Topics Concern  . None  Social History Narrative  . None     Family History:  The patient's family history is not on file. She was adopted.   ROS:   Please see the history of present illness.    Review of Systems  Constitution: Negative.  HENT: Negative.   Eyes: Negative.   Cardiovascular: Negative.   Respiratory: Negative.   Hematologic/Lymphatic: Negative.   Musculoskeletal: Negative.  Negative for joint pain.  Gastrointestinal: Negative.   Genitourinary: Negative.   Neurological: Negative.    All other systems reviewed and are negative.   PHYSICAL EXAM:   VS:  BP 124/70   Pulse 76   Ht 5\' 1"  (1.549 m)   Wt 112 lb (50.8 kg)   BMI 21.16 kg/m   Physical Exam  GEN: Well nourished, well developed, in no acute distress  Neck: no JVD, carotid bruits, or masses Cardiac:RRR; no murmurs, rubs, or gallops  Respiratory:  clear to auscultation bilaterally, normal work of breathing GI: soft, nontender, nondistended, + BS Ext: without cyanosis, clubbing, or edema, Good distal pulses  bilaterally Neuro:  Alert and Oriented x 3 Psych: euthymic mood, full affect  Wt Readings from Last 3 Encounters:  04/19/17 112 lb (50.8 kg)  03/12/17 112 lb 12.8 oz (51.2 kg)  02/27/17 112 lb 6.4 oz (51 kg)      Studies/Labs Reviewed:   EKG:  EKG is  ordered today.  The ekg ordered today demonstrates Sinus bradycardia 47 bpm  Recent Labs: 08/30/2016: ALT 9   Lipid Panel    Component Value Date/Time   CHOL 117 10/24/2016 0823   TRIG 112 10/24/2016 0823   HDL 41 10/24/2016 0823   CHOLHDL 2.9 10/24/2016 0823   CHOLHDL 3.7 04/03/2016 1104   VLDL 20 04/03/2016 1104   LDLCALC 54 10/24/2016 0823    Additional studies/ records that were reviewed today include:   2-D echo 9/2017Study  Conclusions   - Left ventricle: The cavity size was normal. Wall thickness was   normal. Systolic function was normal. The estimated ejection   fraction was in the range of 50% to 55%. There is hypokinesis of   the midanteroseptal myocardium. Doppler parameters are consistent   with abnormal left ventricular relaxation (grade 1 diastolic   dysfunction). - Mitral valve: There was mild regurgitation. - Pulmonary arteries: Systolic pressure was mildly increased. PA   peak pressure: 33 mm Hg (S). - Pericardium, extracardiac: A trivial pericardial effusion was   identified posterior to the heart. There was no evidence of   hemodynamic compromise.   Impressions: - EF improved (prior 45%)    ASSESSMENT:    1. Coronary artery disease involving coronary bypass graft of native heart without angina pectoris   2. Sinus bradycardia   3. Hyperlipidemia, unspecified hyperlipidemia type   4. Essential hypertension     PLAN:  In order of problems listed above:  1. Coronary artery disease, the patient is asymptomatic, will continue the same regimen. 2. Sinus bradycardia resolved with decreasing the dose of metoprolol. 3. Hyperlipidemia, well controlled with Crestor 20 mg daily will continue, she has  no side effects, all lipids at goal in May 2018, we will check at the next visit. 4. Hypertension, well-controlled.  Medication Adjustments/Labs and Tests Ordered: Current medicines are reviewed at length with the patient today.  Concerns regarding medicines are outlined above.  Medication changes, Labs and Tests ordered today are listed in the Patient Instructions below. Patient Instructions  Medication Instructions:   Your physician recommends that you continue on your current medications as directed. Please refer to the Current Medication list given to you today.     Follow-Up:  Your physician wants you to follow-up in: 6 MONTHS WITH DR Johnell ComingsNELSON You will receive a reminder letter in the mail two months in advance. If you don't receive a letter, please call our office to schedule the follow-up appointment.        If you need a refill on your cardiac medications before your next appointment, please call your pharmacy.      Signed, Tobias AlexanderKatarina Kanon Colunga, MD  04/19/2017 9:09 AM    [facility] [address], [place], [place]  [address] Phone: [phone]) [phone]; Fax: [phone]) [phone]

## 2017-10-16 ENCOUNTER — Ambulatory Visit: Payer: PPO | Admitting: Cardiology

## 2017-10-16 ENCOUNTER — Encounter: Payer: Self-pay | Admitting: Cardiology

## 2017-10-16 VITALS — BP 134/82 | HR 67 | Ht 61.0 in | Wt 114.0 lb

## 2017-10-16 DIAGNOSIS — I10 Essential (primary) hypertension: Secondary | ICD-10-CM

## 2017-10-16 DIAGNOSIS — Z951 Presence of aortocoronary bypass graft: Secondary | ICD-10-CM | POA: Diagnosis not present

## 2017-10-16 DIAGNOSIS — E785 Hyperlipidemia, unspecified: Secondary | ICD-10-CM | POA: Diagnosis not present

## 2017-10-16 DIAGNOSIS — I25810 Atherosclerosis of coronary artery bypass graft(s) without angina pectoris: Secondary | ICD-10-CM

## 2017-10-16 LAB — COMPREHENSIVE METABOLIC PANEL
ALT: 12 IU/L (ref 0–32)
AST: 18 IU/L (ref 0–40)
Albumin/Globulin Ratio: 2 (ref 1.2–2.2)
Albumin: 4.2 g/dL (ref 3.5–4.8)
Alkaline Phosphatase: 41 IU/L (ref 39–117)
BUN/Creatinine Ratio: 15 (ref 12–28)
BUN: 15 mg/dL (ref 8–27)
Bilirubin Total: 0.5 mg/dL (ref 0.0–1.2)
CO2: 21 mmol/L (ref 20–29)
Calcium: 9.5 mg/dL (ref 8.7–10.3)
Chloride: 107 mmol/L — ABNORMAL HIGH (ref 96–106)
Creatinine, Ser: 1.03 mg/dL — ABNORMAL HIGH (ref 0.57–1.00)
GFR calc Af Amer: 61 mL/min/{1.73_m2} (ref >59)
GFR calc non Af Amer: 53 mL/min/{1.73_m2} — ABNORMAL LOW (ref >59)
Globulin, Total: 2.1 g/dL (ref 1.5–4.5)
Glucose: 107 mg/dL — ABNORMAL HIGH (ref 65–99)
Potassium: 4 mmol/L (ref 3.5–5.2)
Sodium: 141 mmol/L (ref 134–144)
Total Protein: 6.3 g/dL (ref 6.0–8.5)

## 2017-10-16 LAB — CBC WITH DIFFERENTIAL/PLATELET
Basophils Absolute: 0 10*3/uL (ref 0.0–0.2)
Basos: 1 %
EOS (ABSOLUTE): 0.2 10*3/uL (ref 0.0–0.4)
Eos: 2 %
Hematocrit: 40.6 % (ref 34.0–46.6)
Hemoglobin: 14.1 g/dL (ref 11.1–15.9)
Immature Grans (Abs): 0 10*3/uL (ref 0.0–0.1)
Immature Granulocytes: 0 %
Lymphocytes Absolute: 2.6 10*3/uL (ref 0.7–3.1)
Lymphs: 34 %
MCH: 30.3 pg (ref 26.6–33.0)
MCHC: 34.7 g/dL (ref 31.5–35.7)
MCV: 87 fL (ref 79–97)
Monocytes Absolute: 0.8 10*3/uL (ref 0.1–0.9)
Monocytes: 11 %
Neutrophils Absolute: 3.9 10*3/uL (ref 1.4–7.0)
Neutrophils: 52 %
Platelets: 191 10*3/uL (ref 150–379)
RBC: 4.65 x10E6/uL (ref 3.77–5.28)
RDW: 13.1 % (ref 12.3–15.4)
WBC: 7.6 10*3/uL (ref 3.4–10.8)

## 2017-10-16 LAB — LIPID PANEL
Chol/HDL Ratio: 2.4 ratio (ref 0.0–4.4)
Cholesterol, Total: 105 mg/dL (ref 100–199)
HDL: 43 mg/dL (ref >39)
LDL Calculated: 42 mg/dL (ref 0–99)
Triglycerides: 100 mg/dL (ref 0–149)
VLDL Cholesterol Cal: 20 mg/dL (ref 5–40)

## 2017-10-16 LAB — TSH: TSH: 2.37 u[IU]/mL (ref 0.450–4.500)

## 2017-10-16 NOTE — Progress Notes (Signed)
Cardiology Office Note    Date:  10/16/2017   ID:  Carolyn Burns, DOB [birth year], MRN [id number]  PCP:  Carolyn Dimitri, MD  Cardiologist: Dr. Delton See  Chief complaint: 6 months follow up  History of Present Illness:  Carolyn Burns is a 77 y.o. female with history of CAD status post NSTEMI 01/2016 followed by CABG 3 with LIMA to the LAD, SVG to OM1, SVG to OM 2. 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 She also has a history of statin intolerance with elevated LFTs and was changed to Crestor earlier this year. Patient also had frequent PVCs treated with beta blocker.  I've seen this patient 3 times with bradycardia down in the 40s and have been titrating her beta blocker down.. Last week her heart rate was still 46 so I cut her lopressor back to 25 mg twice a day. Initially she was on high-dose post CABG because of PVCs.  04/19/2017 - the patient's coming after 1 month, at the last visit she was found to be bradycardic and her metoprolol was decreased to 12.5 mg by mouth twice a day. Today she states that her heart rate has improved she also feels better she denies any dizziness fatigue no falls. She also denies any chest pain shortness of breath. She walks 3 times a week for about 20 minutes. She is completely asymptomatic she is compliant with her medications and has no side effects.  Oct 16, 2017 -this is 6 months follow-up, the patient feels great, she has good appetite, she is compliant with all of her meds, and has no side effects.  She denies any chest pain shortness of breath.  She is walking infrequently, however states that she is able to do all the house chores without any symptoms of shortness of breath or chest pain no lower extremity edema orthopnea or proximal nocturnal dyspnea.  Past Medical History:  Diagnosis Date  . CAD (coronary artery disease)      a. 01/2016 NSTEMI/Cath: LM 75, LAD 70ost, LCX 50ost, 70p, LPDA 70, OM1 90;  b. 02/10/2016 CABG x 3 (LIMA->LAD, VG->OM1, VG->OM2).  . Ischemic cardiomyopathy    a. 02/02/2016 Echo: EF 35-40% w/ mid-apicalanteroseptal and apical AK, Gr1 DD-->high suspicion for takotsubo;  b. 02/16/2016 Echo (post-cabg): EF 50-55%, midanteroseptal HK, Gr1 DD, mild MR, PASP , triv effusion.  . Osteoarthritis   . PVC's (premature ventricular contractions)    a. 02/2016 Freq pvc's noted post-op.    Past Surgical History:  Procedure Laterality Date  . CARDIAC CATHETERIZATION N/A 02/01/2016   Procedure: Left Heart Cath and Coronary Angiography;  Surgeon: Lennette Bihari, MD;  Location: [facility] INVASIVE CV LAB;  Service: Cardiovascular;  Laterality: N/A;  . CORONARY ARTERY BYPASS GRAFT N/A 02/10/2016   Procedure: CORONARY ARTERY BYPASS GRAFTING (CABG)x 3 with endoscopic harvesting of right saphenous vein;  Surgeon: [name], MD;  Location: [facility] OR;  Service: Open Heart Surgery;  Laterality: N/A;  . TEE WITHOUT CARDIOVERSION N/A 02/10/2016   Procedure: TRANSESOPHAGEAL ECHOCARDIOGRAM (TEE);  Surgeon: [name], MD;  Location: [facility] OR;  Service: Open Heart Surgery;  Laterality: N/A;    Current Medications: Current Meds  Medication Sig  . aspirin EC 81 MG EC tablet Take 1 tablet (81 mg total) by mouth daily.  . celecoxib (CELEBREX) 200 MG capsule Take 200 mg by mouth daily.  . cholecalciferol (VITAMIN D)  1000 units tablet Take 2,000 Units by mouth daily.   Marland Kitchen estradiol (ESTRACE) 0.5 MG tablet Take 0.5 mg by mouth daily.  Boris Lown Oil 500 MG CAPS Take 1 capsule by mouth daily.  Marland Kitchen lisinopril (PRINIVIL,ZESTRIL) 2.5 MG tablet Take 1 tablet (2.5 mg total) daily by mouth.  . metoprolol tartrate (LOPRESSOR) 25 MG tablet Take 0.5 tablets (12.5 mg total) 2 (two) times daily by mouth.  . progesterone (PROMETRIUM) 100 MG capsule Take 100 mg by mouth daily.  . ranitidine (ZANTAC) 150 MG tablet Take 150 mg by mouth daily as needed  for heartburn.  . rosuvastatin (CRESTOR) 20 MG tablet Take 1 tablet (20 mg total) daily by mouth.     Allergies:   Aleve [naproxen sodium] and Codeine   Social History   Socioeconomic History  . Marital status: Married    Spouse name: Not on file  . Number of children: Not on file  . Years of education: Not on file  . Highest education level: Not on file  Occupational History  . Not on file  Social Needs  . Financial resource strain: Not on file  . Food insecurity:    Worry: Not on file    Inability: Not on file  . Transportation needs:    Medical: Not on file    Non-medical: Not on file  Tobacco Use  . Smoking status: Never Smoker  . Smokeless tobacco: Never Used  Substance and Sexual Activity  . Alcohol use: No  . Drug use: No  . Sexual activity: Not on file  Lifestyle  . Physical activity:    Days per week: Not on file    Minutes per session: Not on file  . Stress: Not on file  Relationships  . Social connections:    Talks on phone: Not on file    Gets together: Not on file    Attends religious service: Not on file    Active member of club or organization: Not on file    Attends meetings of clubs or organizations: Not on file    Relationship status: Not on file  Other Topics Concern  . Not on file  Social History Narrative  . Not on file     Family History:  The patient's family history is not on file. She was adopted.   ROS:   Please see the history of present illness.    Review of Systems  Constitution: Negative.  HENT: Negative.   Eyes: Negative.   Cardiovascular: Negative.   Respiratory: Negative.   Hematologic/Lymphatic: Negative.   Musculoskeletal: Negative.  Negative for joint pain.  Gastrointestinal: Negative.   Genitourinary: Negative.   Neurological: Negative.    All other systems reviewed and are negative.   PHYSICAL EXAM:   VS:  BP 134/82   Pulse 67   Ht  (1.549 m)   Wt 114 lb (51.7 kg)   BMI 21.54 kg/m   Physical Exam    GEN: Well nourished, well developed, in no acute distress  Neck: no JVD, carotid bruits, or masses Cardiac:RRR; no murmurs, rubs, or gallops  Respiratory:  clear to auscultation bilaterally, normal work of breathing GI: soft, nontender, nondistended, + BS Ext: without cyanosis, clubbing, or edema, Good distal pulses bilaterally Neuro:  Alert and Oriented x 3 Psych: euthymic mood, full affect  Wt Readings from Last 3 Encounters:  10/16/17 114 lb (51.7 kg)  04/19/17 112 lb (50.8 kg)  03/12/17 112 lb 12.8 oz (51.2 kg)  Studies/Labs Reviewed:   EKG:  EKG is  ordered today.  The ekg ordered today demonstrates Sinus bradycardia 47 bpm  Recent Labs: No results found for requested labs within last 8760 hours.   Lipid Panel    Component Value Date/Time   CHOL 117 10/24/2016 0823   TRIG 112 10/24/2016 0823   HDL 41 10/24/2016 0823   CHOLHDL 2.9 10/24/2016 0823   CHOLHDL 3.7 04/03/2016 1104   VLDL 20 04/03/2016 1104   LDLCALC 54 10/24/2016 0823    Additional studies/ records that were reviewed today include:   2-D echo 9/2017Study Conclusions   - Left ventricle: The cavity size was normal. Wall thickness was   normal. Systolic function was normal. The estimated ejection   fraction was in the range of 50% to 55%. There is hypokinesis of   the midanteroseptal myocardium. Doppler parameters are consistent   with abnormal left ventricular relaxation (grade 1 diastolic   dysfunction). - Mitral valve: There was mild regurgitation. - Pulmonary arteries: Systolic pressure was mildly increased. PA   peak pressure: 33 mm Hg (S). - Pericardium, extracardiac: A trivial pericardial effusion was   identified posterior to the heart. There was no evidence of   hemodynamic compromise.   Impressions: - EF improved (prior 45%)    ASSESSMENT:    1. Coronary artery disease involving coronary bypass graft of native heart without angina pectoris   2. Essential hypertension   3.  Hyperlipidemia, unspecified hyperlipidemia type   4. S/P CABG x 3     PLAN:  In order of problems listed above:  1. Coronary artery disease, the patient is asymptomatic, will continue the same regimen.  She is tolerating all of the medications well. 2. Sinus bradycardia improved with decreasing the dose of metoprolol.  Her bradycardia has always been asymptomatic. 3. Hyperlipidemia, well controlled with Crestor 20 mg daily will continue, she has no side effects, all lipids at goal in May 2018, we will check at the next visit. 4. Hypertension, well-controlled.  All labs were normal a year ago, we will repeat CBC, CMP, lipids and TSH today.  Medication Adjustments/Labs and Tests Ordered: Current medicines are reviewed at length with the patient today.  Concerns regarding medicines are outlined above.  Medication changes, Labs and Tests ordered today are listed in the Patient Instructions below. Patient Instructions  Medication Instructions:   Your physician recommends that you continue on your current medications as directed. Please refer to the Current Medication list given to you today.    Labwork:  TODAY--CMET, CBC W DIFF, TSH, AND LIPIDS     Follow-Up:  Your physician wants you to follow-up in: 6 MONTHS WITH DR Johnell Comings will receive a reminder letter in the mail two months in advance. If you don't receive a letter, please call our office to schedule the follow-up appointment.        If you need a refill on your cardiac medications before your next appointment, please call your pharmacy.      Signed, Tobias Alexander, MD  10/16/2017 9:44 AM    [facility] [address] [place], [place], [place]  [address] Phone: [phone]; Fax: [phone]

## 2017-10-16 NOTE — Patient Instructions (Signed)
Medication Instructions:   Your physician recommends that you continue on your current medications as directed. Please refer to the Current Medication list given to you today.    Labwork:  TODAY--CMET, CBC W DIFF, TSH, AND LIPIDS      Follow-Up:  Your physician wants you to follow-up in: 6 MONTHS WITH DR NELSON You will receive a reminder letter in the mail two months in advance. If you don't receive a letter, please call our office to schedule the follow-up appointment.        If you need a refill on your cardiac medications before your next appointment, please call your pharmacy.   

## 2017-10-17 ENCOUNTER — Telehealth: Payer: Self-pay | Admitting: Cardiology

## 2017-10-17 NOTE — Telephone Encounter (Signed)
Pt stated she is returning your call from yesterday.

## 2017-10-17 NOTE — Telephone Encounter (Signed)
Lars Masson, MD  Loa Socks, LPN        Normal labs including lipids.    Pt made aware of normal labs, per Dr Delton See, as mentioned above.  Pt verbalized understanding.

## 2017-12-27 DIAGNOSIS — N951 Menopausal and female climacteric states: Secondary | ICD-10-CM | POA: Diagnosis not present

## 2017-12-27 DIAGNOSIS — E785 Hyperlipidemia, unspecified: Secondary | ICD-10-CM | POA: Diagnosis not present

## 2017-12-27 DIAGNOSIS — R7301 Impaired fasting glucose: Secondary | ICD-10-CM | POA: Diagnosis not present

## 2017-12-27 DIAGNOSIS — Z791 Long term (current) use of non-steroidal anti-inflammatories (NSAID): Secondary | ICD-10-CM | POA: Diagnosis not present

## 2017-12-27 DIAGNOSIS — Z0000 Encounter for general adult medical examination without abnormal findings: Secondary | ICD-10-CM | POA: Diagnosis not present

## 2017-12-27 DIAGNOSIS — E559 Vitamin D deficiency, unspecified: Secondary | ICD-10-CM | POA: Diagnosis not present

## 2017-12-27 DIAGNOSIS — M85851 Other specified disorders of bone density and structure, right thigh: Secondary | ICD-10-CM | POA: Diagnosis not present

## 2017-12-27 DIAGNOSIS — K219 Gastro-esophageal reflux disease without esophagitis: Secondary | ICD-10-CM | POA: Diagnosis not present

## 2017-12-27 DIAGNOSIS — I2510 Atherosclerotic heart disease of native coronary artery without angina pectoris: Secondary | ICD-10-CM | POA: Diagnosis not present

## 2017-12-27 DIAGNOSIS — M545 Low back pain: Secondary | ICD-10-CM | POA: Diagnosis not present

## 2017-12-27 DIAGNOSIS — R944 Abnormal results of kidney function studies: Secondary | ICD-10-CM | POA: Diagnosis not present

## 2017-12-31 DIAGNOSIS — Z1389 Encounter for screening for other disorder: Secondary | ICD-10-CM | POA: Diagnosis not present

## 2017-12-31 DIAGNOSIS — I10 Essential (primary) hypertension: Secondary | ICD-10-CM | POA: Diagnosis not present

## 2017-12-31 DIAGNOSIS — E785 Hyperlipidemia, unspecified: Secondary | ICD-10-CM | POA: Diagnosis not present

## 2017-12-31 DIAGNOSIS — E559 Vitamin D deficiency, unspecified: Secondary | ICD-10-CM | POA: Diagnosis not present

## 2017-12-31 DIAGNOSIS — I2510 Atherosclerotic heart disease of native coronary artery without angina pectoris: Secondary | ICD-10-CM | POA: Diagnosis not present

## 2017-12-31 DIAGNOSIS — K219 Gastro-esophageal reflux disease without esophagitis: Secondary | ICD-10-CM | POA: Diagnosis not present

## 2017-12-31 DIAGNOSIS — Z791 Long term (current) use of non-steroidal anti-inflammatories (NSAID): Secondary | ICD-10-CM | POA: Diagnosis not present

## 2017-12-31 DIAGNOSIS — Z0000 Encounter for general adult medical examination without abnormal findings: Secondary | ICD-10-CM | POA: Diagnosis not present

## 2017-12-31 DIAGNOSIS — M545 Low back pain: Secondary | ICD-10-CM | POA: Diagnosis not present

## 2017-12-31 DIAGNOSIS — N951 Menopausal and female climacteric states: Secondary | ICD-10-CM | POA: Diagnosis not present

## 2018-04-25 ENCOUNTER — Encounter: Payer: Self-pay | Admitting: Cardiology

## 2018-04-25 ENCOUNTER — Ambulatory Visit: Payer: PPO | Admitting: Cardiology

## 2018-04-25 VITALS — BP 122/64 | HR 63 | Ht 61.0 in | Wt 113.0 lb

## 2018-04-25 DIAGNOSIS — I10 Essential (primary) hypertension: Secondary | ICD-10-CM

## 2018-04-25 DIAGNOSIS — I25810 Atherosclerosis of coronary artery bypass graft(s) without angina pectoris: Secondary | ICD-10-CM

## 2018-04-25 DIAGNOSIS — E785 Hyperlipidemia, unspecified: Secondary | ICD-10-CM | POA: Diagnosis not present

## 2018-04-25 MED ORDER — ROSUVASTATIN CALCIUM 20 MG PO TABS: 20.0000 mg | ORAL_TABLET | Freq: Every day | ORAL | 3 refills | Status: DC

## 2018-04-25 MED ORDER — METOPROLOL TARTRATE 25 MG PO TABS: 12.5000 mg | ORAL_TABLET | Freq: Two times a day (BID) | ORAL | 3 refills | Status: DC

## 2018-04-25 MED ORDER — LISINOPRIL 2.5 MG PO TABS: 2.5000 mg | ORAL_TABLET | Freq: Every day | ORAL | 3 refills | Status: DC

## 2018-04-25 NOTE — Progress Notes (Signed)
Cardiology Office Note    Date:  04/25/2018   ID:  Carolyn Burns, DOB 09-11-1940, MRN [id number]  PCP:  Carolyn Dimitri, MD  Cardiologist: Dr. Delton See  Chief complaint: 6 months follow up  History of Present Illness:  Carolyn Burns is a 77 y.o. female with history of CAD status post NSTEMI 01/2016 followed by CABG 3 with LIMA to the LAD, SVG to OM1, SVG to OM 2. 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 She also has a history of statin intolerance with elevated LFTs and was changed to Crestor earlier this year. Patient also had frequent PVCs treated with beta blocker.  I've seen this patient 3 times with bradycardia down in the 40s and have been titrating her beta blocker down.. Last week her heart rate was still 46 so I cut her lopressor back to 25 mg twice a day. Initially she was on high-dose post CABG because of PVCs.  04/25/2018, the patient is coming after 6 months, she has been doing well, she denies any chest pain or shortness of breath or dizziness syncope or falls.  She is no lower extremity edema.  Her dizziness has improved significantly with decreased dose of metoprolol.  Past Medical History:  Diagnosis Date  . CAD (coronary artery disease)    a. 01/2016 NSTEMI/Cath: LM 75, LAD 70ost, LCX 50ost, 70p, LPDA 70, OM1 90;  b. 02/10/2016 CABG x 3 (LIMA->LAD, VG->OM1, VG->OM2).  . Ischemic cardiomyopathy    a. 02/02/2016 Echo: EF 35-40% w/ mid-apicalanteroseptal and apical AK, Gr1 DD-->high suspicion for takotsubo;  b. 02/16/2016 Echo (post-cabg): EF 50-55%, midanteroseptal HK, Gr1 DD, mild MR, PASP , triv effusion.  . Osteoarthritis   . PVC's (premature ventricular contractions)    a. 02/2016 Freq pvc's noted post-op.    Past Surgical History:  Procedure Laterality Date  . CARDIAC CATHETERIZATION N/A 02/01/2016   Procedure: Left Heart Cath and  Coronary Angiography;  Surgeon: Lennette Bihari, MD;  Location: [facility] INVASIVE CV LAB;  Service: Cardiovascular;  Laterality: N/A;  . CORONARY ARTERY BYPASS GRAFT N/A 02/10/2016   Procedure: CORONARY ARTERY BYPASS GRAFTING (CABG)x 3 with endoscopic harvesting of right saphenous vein;  Surgeon: [name], MD;  Location: [facility] OR;  Service: Open Heart Surgery;  Laterality: N/A;  . TEE WITHOUT CARDIOVERSION N/A 02/10/2016   Procedure: TRANSESOPHAGEAL ECHOCARDIOGRAM (TEE);  Surgeon: [name], MD;  Location: [facility] OR;  Service: Open Heart Surgery;  Laterality: N/A;    Current Medications: Current Meds  Medication Sig  . aspirin EC 81 MG EC tablet Take 1 tablet (81 mg total) by mouth daily.  . celecoxib (CELEBREX) 200 MG capsule Take 200 mg by mouth daily.  . cholecalciferol (VITAMIN D) 1000 units tablet Take 2,000 Units by mouth daily.   Marland Kitchen estradiol (ESTRACE) 0.5 MG tablet Take 0.5 mg by mouth daily.  Boris Lown Oil 500 MG CAPS Take 1 capsule by mouth daily.  Marland Kitchen lisinopril (PRINIVIL,ZESTRIL) 2.5 MG tablet Take 1 tablet (2.5 mg total) by mouth daily.  . metoprolol tartrate (LOPRESSOR) 25 MG tablet Take 0.5 tablets (12.5 mg total) by mouth 2 (two) times daily.  . progesterone (PROMETRIUM) 100 MG capsule Take 100 mg by mouth daily.  . ranitidine (ZANTAC) 150 MG tablet Take 150 mg by mouth daily as needed for heartburn.  . rosuvastatin (CRESTOR) 20 MG tablet Take 1 tablet (20 mg total) by  mouth daily.  . [DISCONTINUED] lisinopril (PRINIVIL,ZESTRIL) 2.5 MG tablet Take 1 tablet (2.5 mg total) daily by mouth.  . [DISCONTINUED] metoprolol tartrate (LOPRESSOR) 25 MG tablet Take 0.5 tablets (12.5 mg total) 2 (two) times daily by mouth.  . [DISCONTINUED] rosuvastatin (CRESTOR) 20 MG tablet Take 1 tablet (20 mg total) daily by mouth.     Allergies:   Aleve [naproxen sodium] and Codeine   Social History   Socioeconomic History  . Marital status: Married    Spouse name: Not on file  . Number of children: Not  on file  . Years of education: Not on file  . Highest education level: Not on file  Occupational History  . Not on file  Social Needs  . Financial resource strain: Not on file  . Food insecurity:    Worry: Not on file    Inability: Not on file  . Transportation needs:    Medical: Not on file    Non-medical: Not on file  Tobacco Use  . Smoking status: Never Smoker  . Smokeless tobacco: Never Used  Substance and Sexual Activity  . Alcohol use: No  . Drug use: No  . Sexual activity: Not on file  Lifestyle  . Physical activity:    Days per week: Not on file    Minutes per session: Not on file  . Stress: Not on file  Relationships  . Social connections:    Talks on phone: Not on file    Gets together: Not on file    Attends religious service: Not on file    Active member of club or organization: Not on file    Attends meetings of clubs or organizations: Not on file    Relationship status: Not on file  Other Topics Concern  . Not on file  Social History Narrative  . Not on file     Family History:  The patient's family history is not on file. She was adopted.   ROS:   Please see the history of present illness.    Review of Systems  Constitution: Negative.  HENT: Negative.   Eyes: Negative.   Cardiovascular: Negative.   Respiratory: Negative.   Hematologic/Lymphatic: Negative.   Musculoskeletal: Negative.  Negative for joint pain.  Gastrointestinal: Negative.   Genitourinary: Negative.   Neurological: Negative.    All other systems reviewed and are negative.   PHYSICAL EXAM:   VS:  BP 122/64   Pulse 63   Ht 5\' 1"  (1.549 m)   Wt 113 lb (51.3 kg)   BMI 21.35 kg/m   Physical Exam  GEN: Well nourished, well developed, in no acute distress  Neck: no JVD, carotid bruits, or masses Cardiac:RRR; no murmurs, rubs, or gallops  Respiratory:  clear to auscultation bilaterally, normal work of breathing GI: soft, nontender, nondistended, + BS Ext: without cyanosis,  clubbing, or edema, Good distal pulses bilaterally Neuro:  Alert and Oriented x 3 Psych: euthymic mood, full affect  Wt Readings from Last 3 Encounters:  04/25/18 113 lb (51.3 kg)  10/16/17 114 lb (51.7 kg)  04/19/17 112 lb (50.8 kg)    Studies/Labs Reviewed:   EKG:  EKG is  ordered today.  The ekg ordered today demonstrates sinus rhythm with ventricular rate 63 bpm, otherwise normal EKG.  This was personally reviewed.  Recent Labs: 10/16/2017: ALT 12; BUN 15; Creatinine, Ser 1.03; Hemoglobin 14.1; Platelets 191; Potassium 4.0; Sodium 141; TSH 2.370   Lipid Panel    Component Value Date/Time  CHOL 105 10/16/2017 0930   TRIG 100 10/16/2017 0930   HDL 43 10/16/2017 0930   CHOLHDL 2.4 10/16/2017 0930   CHOLHDL 3.7 04/03/2016 1104   VLDL 20 04/03/2016 1104   LDLCALC 42 10/16/2017 0930    Additional studies/ records that were reviewed today include:   2-D echo 9/2017Study Conclusions - Left ventricle: The cavity size was normal. Wall thickness was   normal. Systolic function was normal. The estimated ejection   fraction was in the range of 50% to 55%. There is hypokinesis of   the midanteroseptal myocardium. Doppler parameters are consistent   with abnormal left ventricular relaxation (grade 1 diastolic   dysfunction). - Mitral valve: There was mild regurgitation. - Pulmonary arteries: Systolic pressure was mildly increased. PA   peak pressure: 33 mm Hg (S). - Pericardium, extracardiac: A trivial pericardial effusion was   identified posterior to the heart. There was no evidence of   hemodynamic compromise.   Impressions: - EF improved (prior 45%)    ASSESSMENT:    1. Coronary artery disease involving coronary bypass graft of native heart without angina pectoris   2. Essential hypertension   3. Hyperlipidemia, unspecified hyperlipidemia type     PLAN:  In order of problems listed above:  1. Coronary artery disease, the patient is asymptomatic, will continue the  same regimen.  She is tolerating all of the medications well.  2.  Hypertension -well-controlled on current regimen  3. Hyperlipidemia, well controlled with Crestor 20 mg daily will continue, she has mild pain in the back of her legs but she believes it secondary to arthritis.  Her lipids were all at goal in May 2019 also LFTs were normal.  Follow-up in 6 months.  Blood work prior to the visit.  Medication Adjustments/Labs and Tests Ordered: Current medicines are reviewed at length with the patient today.  Concerns regarding medicines are outlined above.  Medication changes, Labs and Tests ordered today are listed in the Patient Instructions below. Patient Instructions  Medication Instructions:    Your physician recommends that you continue on your current medications as directed. Please refer to the Current Medication list given to you today.  If you need a refill on your cardiac medications before your next appointment, please call your pharmacy.     Lab work:  PRIOR TO YOUR 6 MONTH FOLLOW-UP APPOINTMENT WITH DR Alica Shellhammer TO CHECK---CMET, CBC W DIFF, TSH, AND LIPIDS--PLEASE COME FASTING TO THIS LAB APPOINTMENT--CHECKOUT CAN YOU PLEASE GO AHEAD AND SCHEDULE HER LAB FOR 6 MONTHS OUT TODAY, PER DR Corrissa Martello  If you have labs (blood work) drawn today and your tests are completely normal, you will receive your results only by: Marland Kitchen. MyChart Message (if you have MyChart) OR . A paper copy in the mail If you have any lab test that is abnormal or we need to change your treatment, we will call you to review the results.   Follow-Up: At [facility], you and your health needs are our priority.  As part of our continuing mission to provide you with exceptional heart care, we have created designated Provider Care Teams.  These Care Teams include your primary Cardiologist (physician) and Advanced Practice Providers (APPs -  Physician Assistants and Nurse Practitioners) who all work together to provide  you with the care you need, when you need it. You will need a follow up appointment in 6 months.  Please call our office 2 months in advance to schedule this appointment.  You may see Aris LotKatarina  Delton See, MD or one of the following Advanced Practice Providers on your designated Care Team:   [place], PA-C Ronie Spies, [place] . Jacolyn Reedy, PA-C    *PLEASE SCHEDULE YOUR 6 MONTH LAB APPOINTMENT TODAY AT CHECKOUT      Signed, Tobias Alexander, MD  04/25/2018 10:22 AM    [facility] [address] [place], [place], [place]  [address] Phone: [phone]; Fax: [phone]

## 2018-04-25 NOTE — Patient Instructions (Signed)
Medication Instructions:    Your physician recommends that you continue on your current medications as directed. Please refer to the Current Medication list given to you today.  If you need a refill on your cardiac medications before your next appointment, please call your pharmacy.     Lab work:  PRIOR TO YOUR 6 MONTH FOLLOW-UP APPOINTMENT WITH DR NELSON TO CHECK---CMET, CBC W DIFF, TSH, AND LIPIDS--PLEASE COME FASTING TO THIS LAB APPOINTMENT--CHECKOUT CAN YOU PLEASE GO AHEAD AND SCHEDULE HER LAB FOR 6 MONTHS OUT TODAY, PER DR NELSON  If you have labs (blood work) drawn today and your tests are completely normal, you will receive your results only by: Marland Kitchen. MyChart Message (if you have MyChart) OR . A paper copy in the mail If you have any lab test that is abnormal or we need to change your treatment, we will call you to review the results.   Follow-Up: At [facility], you and your health needs are our priority.  As part of our continuing mission to provide you with exceptional heart care, we have created designated Provider Care Teams.  These Care Teams include your primary Cardiologist (physician) and Advanced Practice Providers (APPs -  Physician Assistants and Nurse Practitioners) who all work together to provide you with the care you need, when you need it. You will need a follow up appointment in 6 months.  Please call our office 2 months in advance to schedule this appointment.  You may see Tobias AlexanderKatarina Nelson, MD or one of the following Advanced Practice Providers on your designated Care Team:   [place]itt[place]y Simmons, PA-C Ronie Spiesayna Dunn, PA-C . Jacolyn ReedyMichele Lenze, PA-C    *PLEASE SCHEDULE YOUR 6 MONTH LAB APPOINTMENT TODAY AT CHECKOUT

## 2018-05-17 IMAGING — CR DG CHEST 1V PORT
1 series · 1 of 1 positions shown · non-contrast
Comparison: Study obtained earlier in the day

CLINICAL DATA: Hypoxia.  Adjusted Swan-Ganz catheter

EXAM:
PORTABLE CHEST 1 VIEW

[AP]
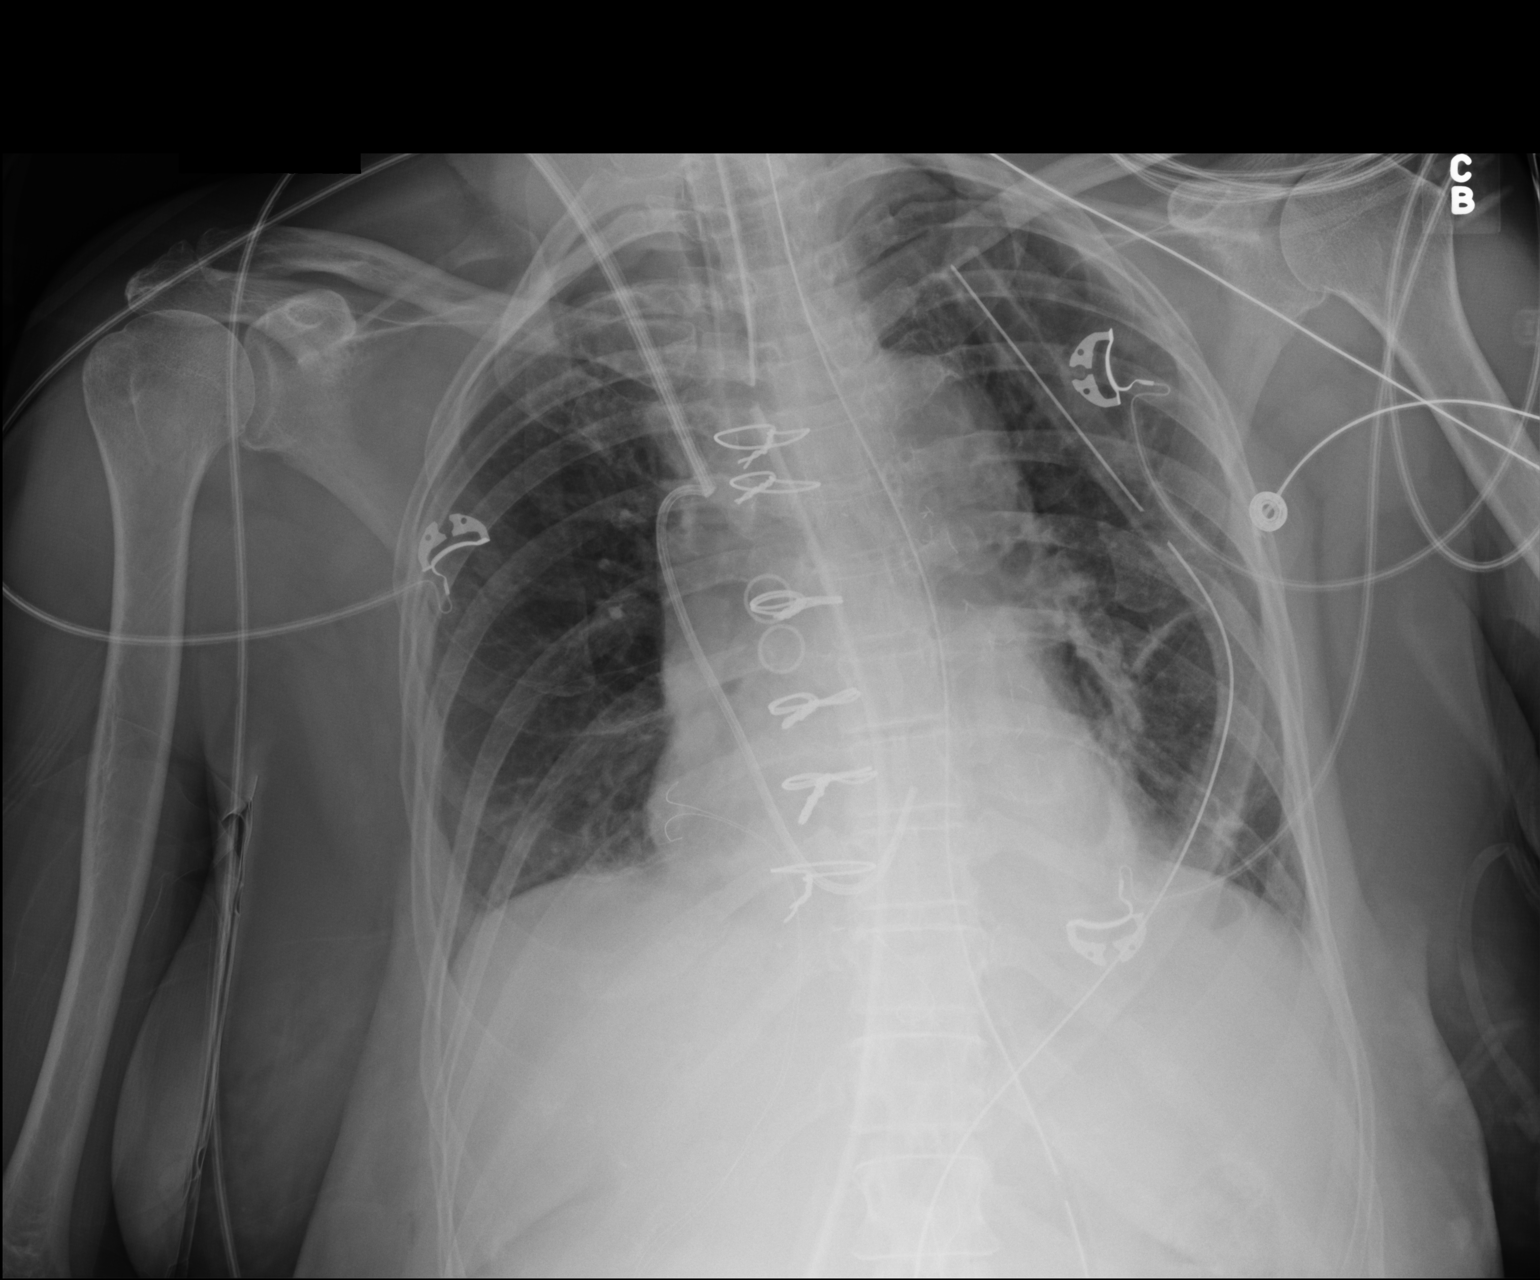

[1 of 1 positions shown; findings below may reference images not displayed]

FINDINGS: Swan-Ganz catheter tip is now in the right atrium near the tricuspid
valve. Endotracheal tube tip is 4.2 cm above the carina. Nasogastric
to side-port is in the stomach with the tip below the inferior
aspect of the image, likely in the stomach as well. There is a
left-sided chest tube as well as a mediastinal drain. Temporary
pacemaker wires are attached to the right heart. There is a skin
fold on the left but no evident pneumothorax. There is mild
atelectatic change in the left base. Lungs elsewhere clear. Heart
size and pulmonary vascularity are normal. No adenopathy.
IMPRESSION: Tube and catheter positions as described without pneumothorax. Note
that the Swan-Ganz catheter tip is now in the right atrium near the
tricuspid valve. Left base atelectasis. Lungs elsewhere clear.
Stable cardiac silhouette.

## 2018-05-17 IMAGING — CR DG CHEST 1V PORT
1 series · 1 of 1 positions shown · non-contrast
Comparison: 01/31/2016

CLINICAL DATA: Status post bypass surgery.

EXAM:
PORTABLE CHEST 1 VIEW

[AP]
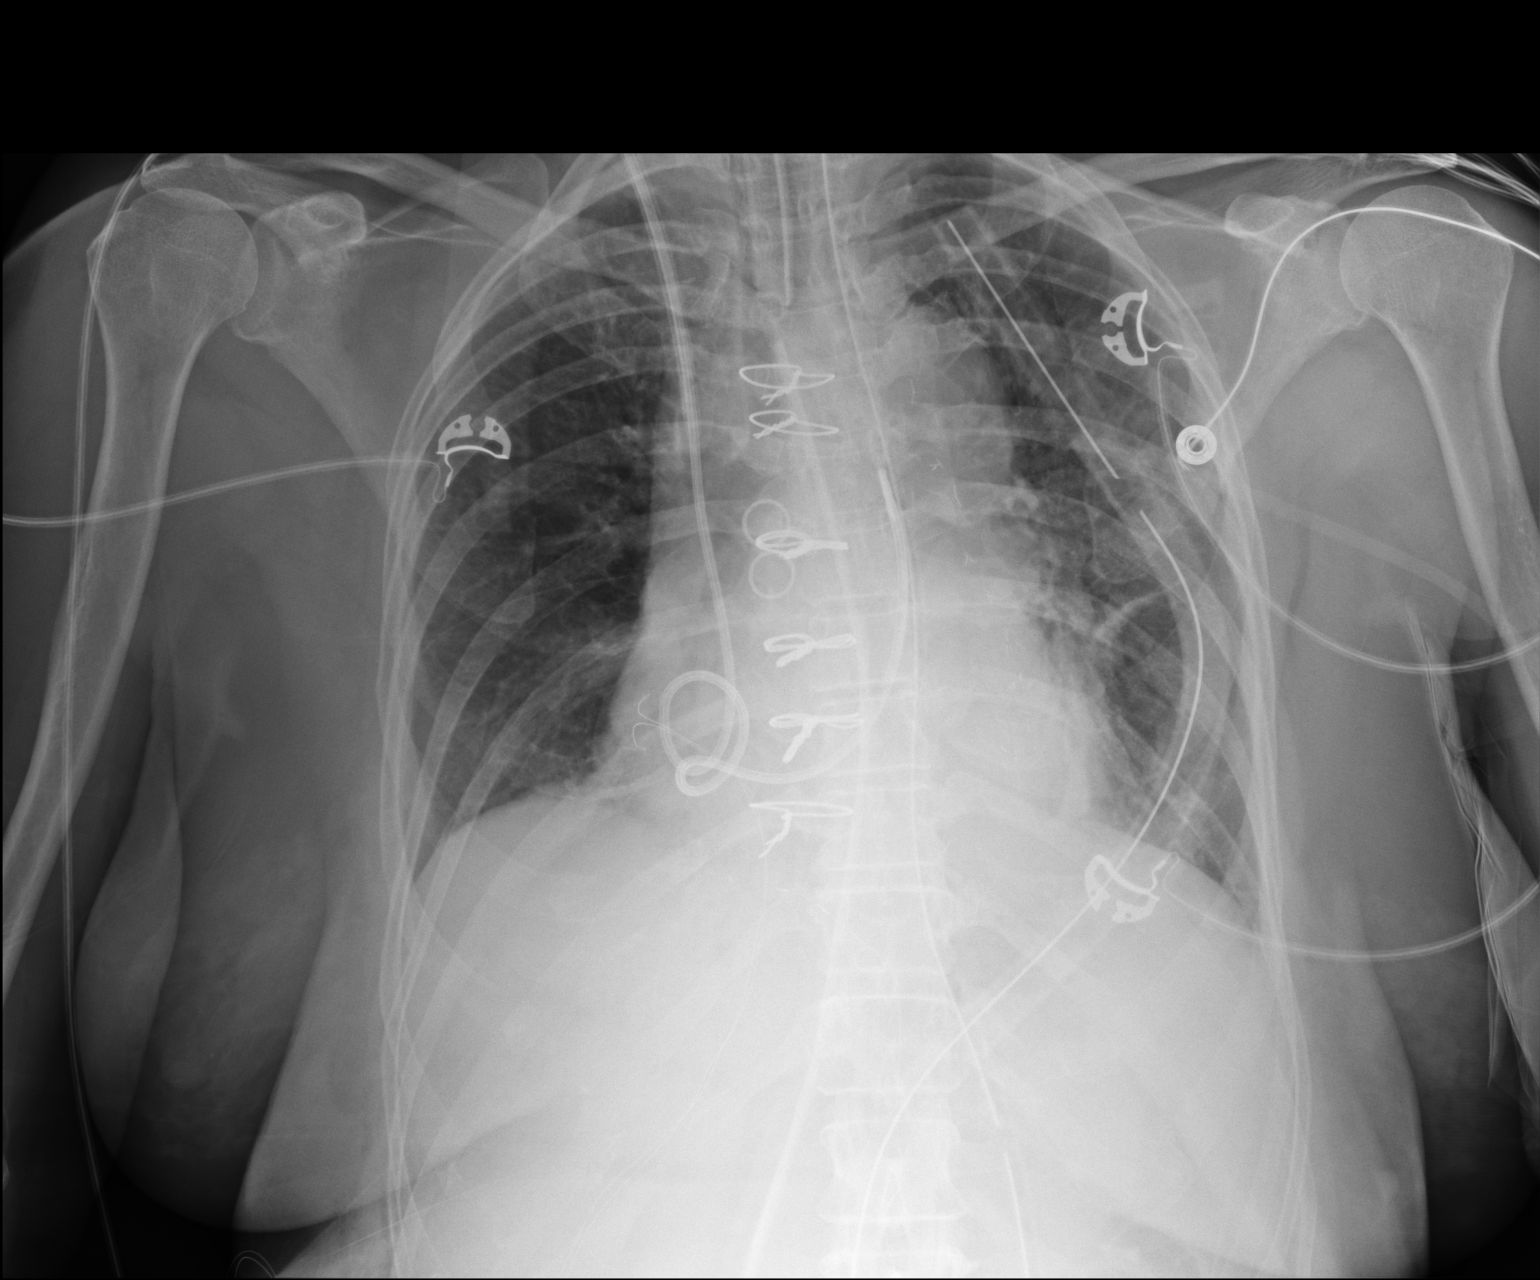

[1 of 1 positions shown; findings below may reference images not displayed]

FINDINGS: The endotracheal to is 4.5 cm above the carina. The right IJ
Swan-Ganz catheter is looped twice in the right atrium. The tip is
in the top of the pulmonary outflow tract. The NG tube is coursing
down the esophagus and into the stomach. Epicardial pacer leads are
noted. A left chest tube is in place. No pneumothorax.

The cardiac silhouette, mediastinal and hilar contours are within
normal limits. Mild vascular congestion and streaky areas of
atelectasis.
IMPRESSION: Postoperative support apparatus in good position as discussed above.

Mild vascular congestion and streaky areas atelectasis. No
pneumothorax.

## 2018-05-17 IMAGING — CR DG CHEST 1V PORT
1 series · 1 of 1 positions shown · non-contrast
Comparison: 02/10/2016

CLINICAL DATA: Postop from CABG.  Ischemic cardiomyopathy.

EXAM:
PORTABLE CHEST 1 VIEW

[AP]
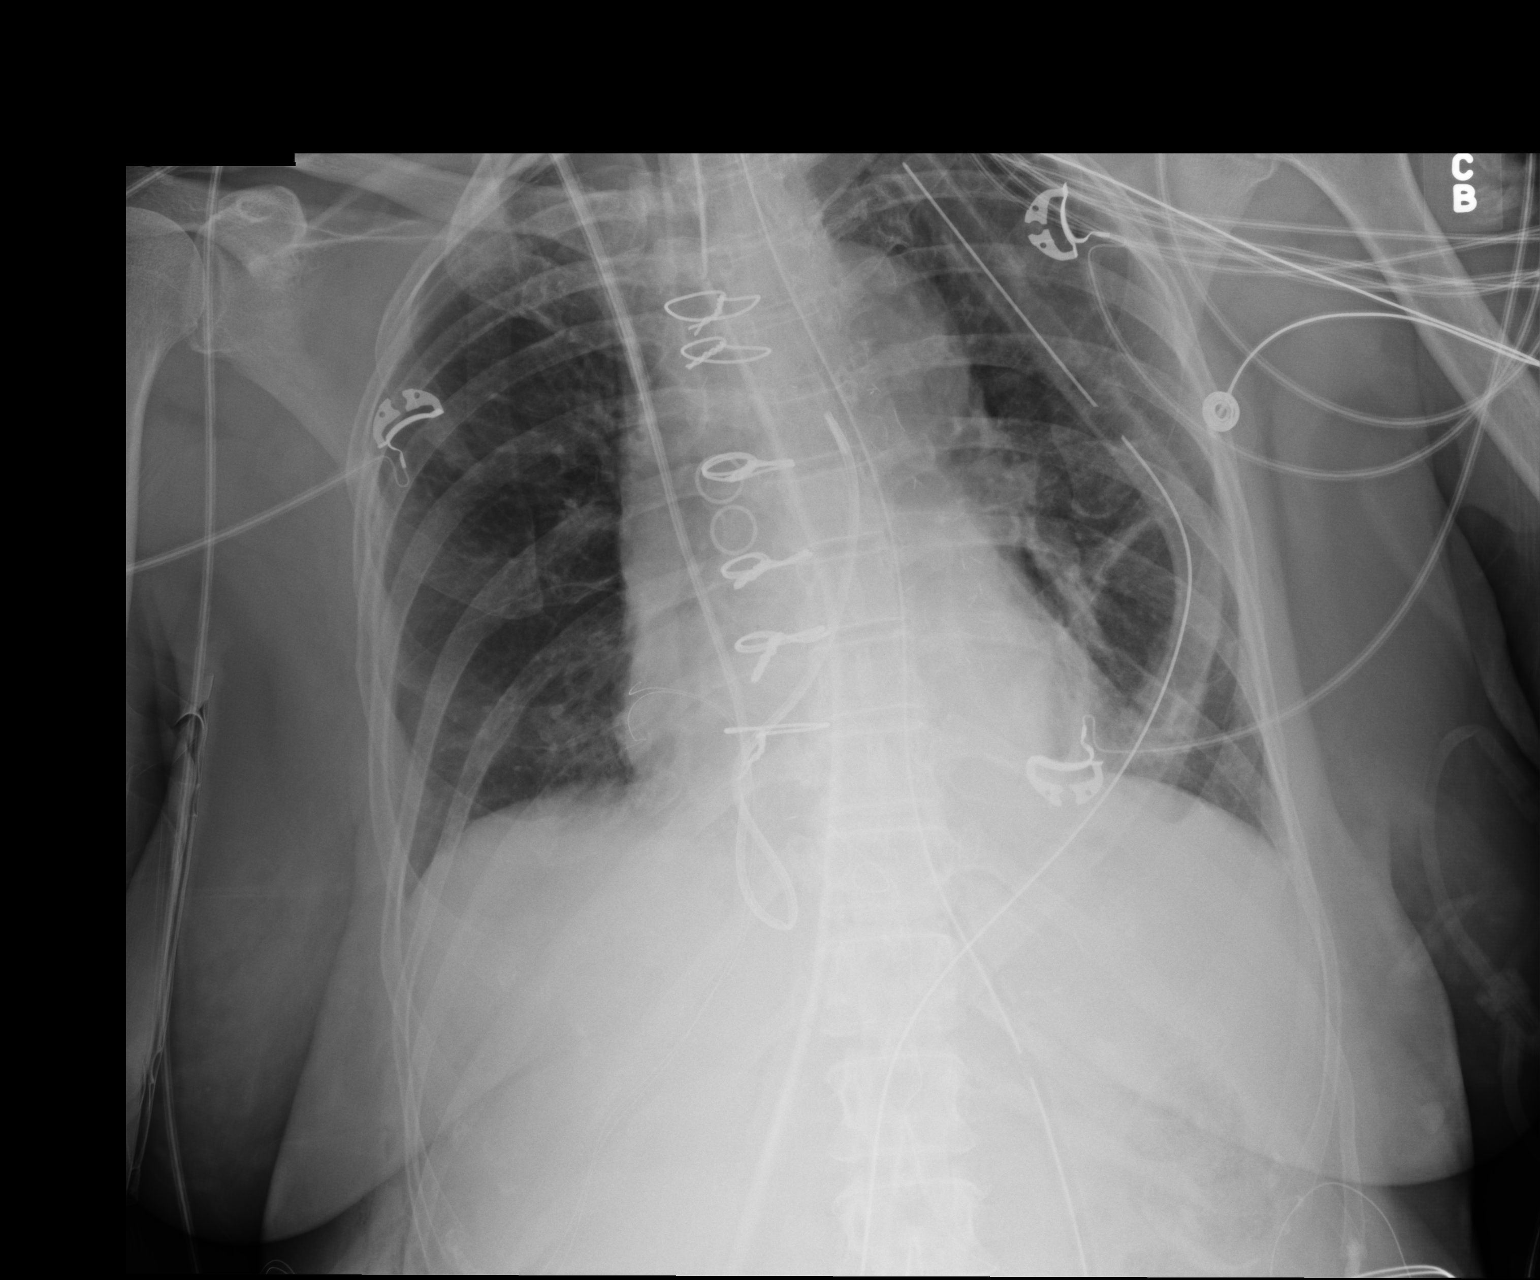

[1 of 1 positions shown; findings below may reference images not displayed]

FINDINGS: Swan-Ganz catheter tip remains in the main pulmonary artery.
Endotracheal tube, left chest tube, nasogastric tube, and
mediastinal drain remain in expected position.

A small 5-10% right apical pneumothorax is seen which appears new.
No left pneumothorax visualized. Mild left lower lobe atelectasis
remains stable. Heart size and mediastinal contours are unchanged.
IMPRESSION: New small 5-10% right apical pneumothorax.

Stable mild left lower lobe atelectasis. No left pneumothorax
visualized.

These results will be called to the ordering clinician or
representative by the Radiologist Assistant, and communication
documented in the PACS or zVision Dashboard.

## 2018-05-18 IMAGING — CR DG CHEST 1V PORT
1 series · 1 of 1 positions shown · non-contrast
Comparison: Chest x-ray 02/10/2016.

CLINICAL DATA: 75-year-old female with history of CABG. Chest
soreness today.

EXAM:
PORTABLE CHEST 1 VIEW

[AP]
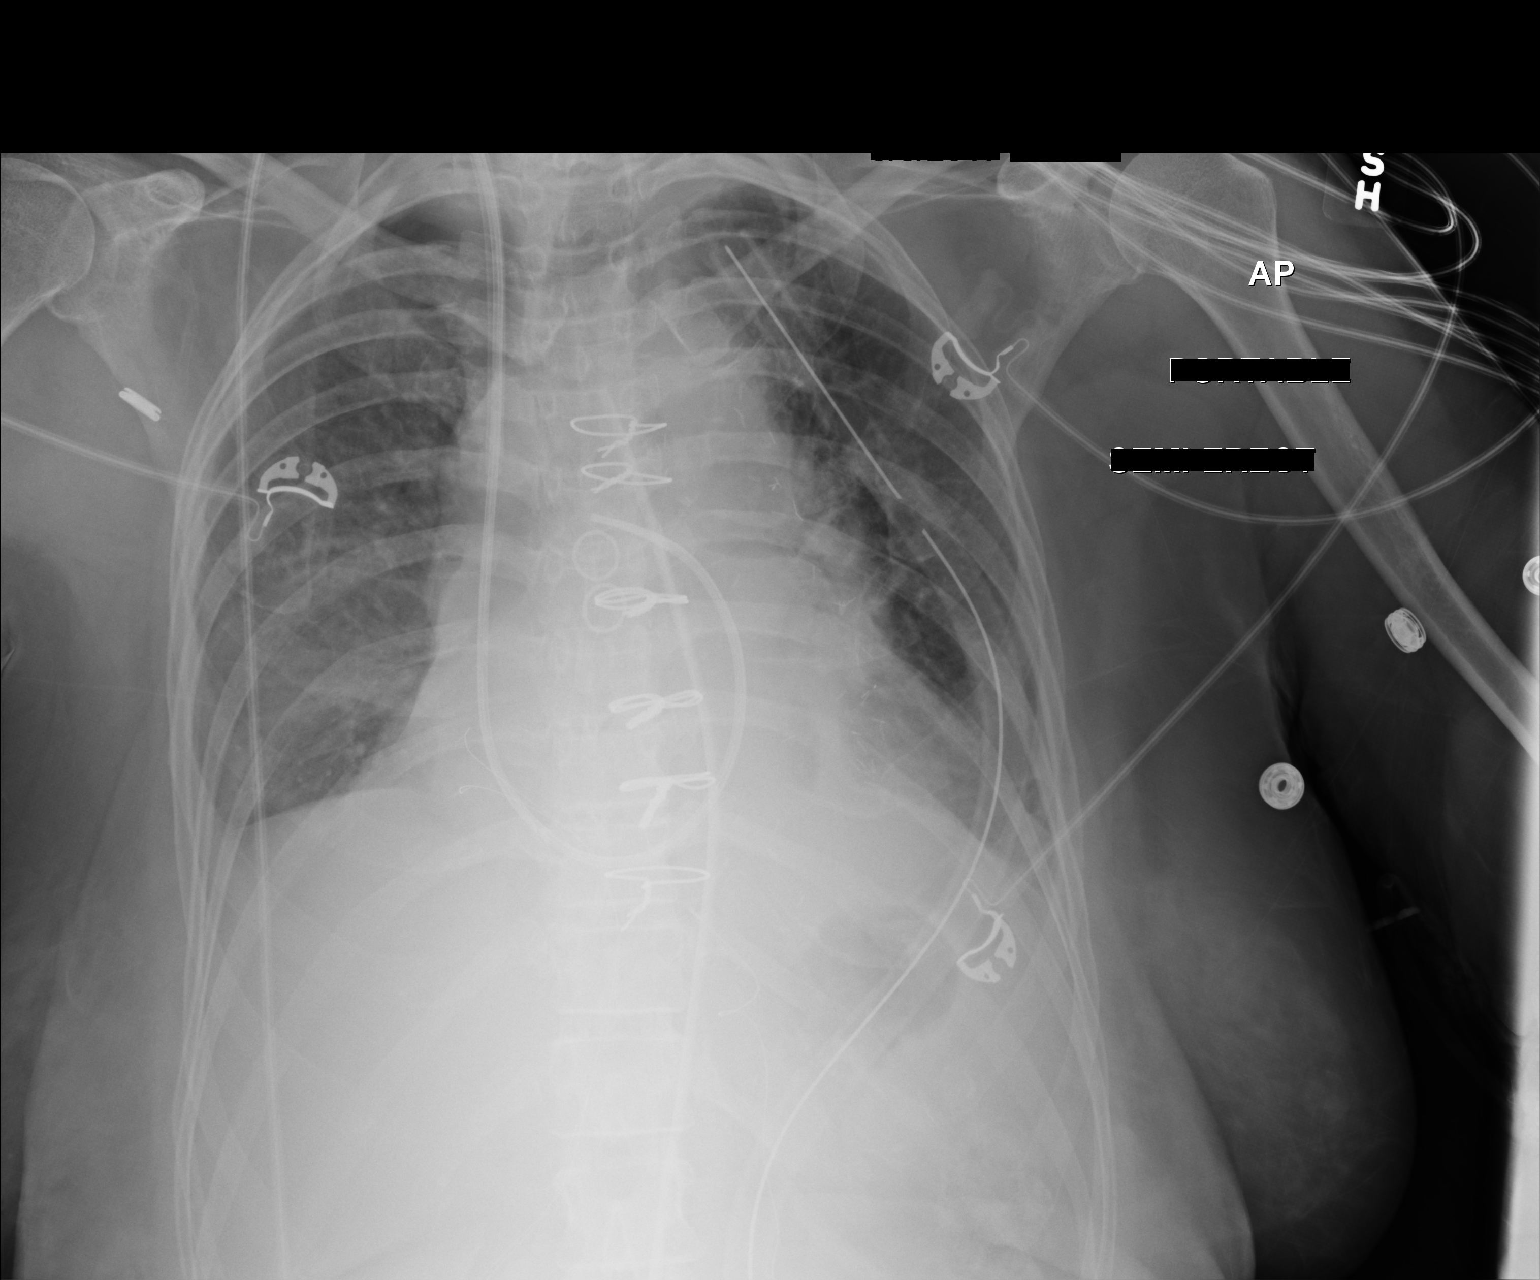

[1 of 1 positions shown; findings below may reference images not displayed]

FINDINGS: Previously noted nasogastric tube and endotracheal tube have both
been removed. Right IJ Cordis through which a Swan-Ganz catheter has
been passed into the proximal right main pulmonary artery. Midline
surgical drain, presumably a mediastinal drain. Left-sided chest
tube with tip near the apex of the left hemithorax. Lung volumes are
low with bibasilar opacities which appear to represent worsening
areas of postoperative subsegmental atelectasis. Small bilateral
pleural effusions. No appreciable pneumothorax. Vascular crowding,
without frank pulmonary edema, likely related to low lung volumes.
Mild enlargement of the cardiopericardial silhouette, expected in
the immediate postoperative state. Aortic atherosclerosis. Status
post median sternotomy for CABG. Epicardial pacing wires are again
noted.
IMPRESSION: 1. Postoperative changes and support apparatus, as above.
2. Slight decrease in lung volumes with worsening bibasilar
opacities which likely reflect mild worsening postoperative
subsegmental atelectasis following extubation.
3. Small bilateral pleural effusions.
4. Aortic atherosclerosis.

## 2018-10-15 ENCOUNTER — Telehealth: Payer: Self-pay | Admitting: Cardiology

## 2018-10-15 NOTE — Telephone Encounter (Signed)
Pt has lab work scheduled for 05/20. When the labs were scheduled, she hoped to see Dr. Delton See not long after labs.  She called to schedule an appt with Dr. Delton See, but Dr. Lindaann Slough schedule was full, so she has an appt scheduled with Boyce Medici on 05/21.

## 2018-10-15 NOTE — Telephone Encounter (Signed)
Placed a note beside her labs that MUST KEEP APPT, so that we can have them back when [place] PA-C see's her the following day.

## 2018-10-17 ENCOUNTER — Telehealth: Payer: Self-pay | Admitting: Cardiology

## 2018-10-17 NOTE — Telephone Encounter (Signed)
New Message  On 10-17-18/10:43am. Pt confirmed she is ready for virtual visit on 10-24-18/renee val.

## 2018-10-21 ENCOUNTER — Telehealth: Payer: Self-pay

## 2018-10-21 NOTE — Telephone Encounter (Signed)
   TELEPHONE CALL NOTE  This patient has been deemed a candidate for follow-up tele-health visit to limit community exposure during the Covid-19 pandemic. I spoke with the patient via phone to discuss instructions.  A Virtual Office Visit appointment type has been scheduled for 5/21 @ 8:00 am with Robbie Lis, PA, with "VIDEO" or "TELEPHONE" in the appointment notes - patient prefers Video type.  Sigurd Sos, RN 10/21/2018 1:45 PM   Patient has consented to a Virtual Visit by Video with Robbie Lis, PA on 5/21 @ 8:00 am.

## 2018-10-23 ENCOUNTER — Other Ambulatory Visit: Payer: PPO | Admitting: *Deleted

## 2018-10-23 ENCOUNTER — Other Ambulatory Visit: Payer: Self-pay

## 2018-10-23 DIAGNOSIS — I10 Essential (primary) hypertension: Secondary | ICD-10-CM | POA: Diagnosis not present

## 2018-10-23 DIAGNOSIS — I25810 Atherosclerosis of coronary artery bypass graft(s) without angina pectoris: Secondary | ICD-10-CM | POA: Diagnosis not present

## 2018-10-23 DIAGNOSIS — E785 Hyperlipidemia, unspecified: Secondary | ICD-10-CM

## 2018-10-23 LAB — CBC WITH DIFFERENTIAL/PLATELET
Basophils Absolute: 0.1 10*3/uL (ref 0.0–0.2)
Basos: 1 %
EOS (ABSOLUTE): 0.2 10*3/uL (ref 0.0–0.4)
Eos: 2 %
Hematocrit: 41.9 % (ref 34.0–46.6)
Hemoglobin: 14.5 g/dL (ref 11.1–15.9)
Immature Grans (Abs): 0 10*3/uL (ref 0.0–0.1)
Immature Granulocytes: 0 %
Lymphocytes Absolute: 3.5 10*3/uL — ABNORMAL HIGH (ref 0.7–3.1)
Lymphs: 34 %
MCH: 30.3 pg (ref 26.6–33.0)
MCHC: 34.6 g/dL (ref 31.5–35.7)
MCV: 88 fL (ref 79–97)
Monocytes Absolute: 0.8 10*3/uL (ref 0.1–0.9)
Monocytes: 8 %
Neutrophils Absolute: 5.6 10*3/uL (ref 1.4–7.0)
Neutrophils: 55 %
Platelets: 157 10*3/uL (ref 150–450)
RBC: 4.78 x10E6/uL (ref 3.77–5.28)
RDW: 12.4 % (ref 11.7–15.4)
WBC: 10.2 10*3/uL (ref 3.4–10.8)

## 2018-10-23 LAB — COMPREHENSIVE METABOLIC PANEL
ALT: 15 IU/L (ref 0–32)
AST: 18 IU/L (ref 0–40)
Albumin/Globulin Ratio: 2.2 (ref 1.2–2.2)
Albumin: 4.4 g/dL (ref 3.7–4.7)
Alkaline Phosphatase: 42 IU/L (ref 39–117)
BUN/Creatinine Ratio: 13 (ref 12–28)
BUN: 17 mg/dL (ref 8–27)
Bilirubin Total: 0.5 mg/dL (ref 0.0–1.2)
CO2: 20 mmol/L (ref 20–29)
Calcium: 9.3 mg/dL (ref 8.7–10.3)
Chloride: 107 mmol/L — ABNORMAL HIGH (ref 96–106)
Creatinine, Ser: 1.27 mg/dL — ABNORMAL HIGH (ref 0.57–1.00)
GFR calc Af Amer: 47 mL/min/{1.73_m2} — ABNORMAL LOW (ref >59)
GFR calc non Af Amer: 41 mL/min/{1.73_m2} — ABNORMAL LOW (ref >59)
Globulin, Total: 2 g/dL (ref 1.5–4.5)
Glucose: 128 mg/dL — ABNORMAL HIGH (ref 65–99)
Potassium: 4 mmol/L (ref 3.5–5.2)
Sodium: 141 mmol/L (ref 134–144)
Total Protein: 6.4 g/dL (ref 6.0–8.5)

## 2018-10-23 LAB — LIPID PANEL
Chol/HDL Ratio: 2.7 ratio (ref 0.0–4.4)
Cholesterol, Total: 114 mg/dL (ref 100–199)
HDL: 42 mg/dL (ref >39)
LDL Calculated: 46 mg/dL (ref 0–99)
Triglycerides: 131 mg/dL (ref 0–149)
VLDL Cholesterol Cal: 26 mg/dL (ref 5–40)

## 2018-10-23 LAB — TSH: TSH: 5.04 u[IU]/mL — ABNORMAL HIGH (ref 0.450–4.500)

## 2018-10-23 NOTE — Progress Notes (Signed)
Virtual Visit via Video Note   This visit type was conducted due to national recommendations for restrictions regarding the COVID-19 Pandemic (e.g. social distancing) in an effort to limit this patient's exposure and mitigate transmission in our community.  Due to her co-morbid illnesses, this patient is at least at moderate risk for complications without adequate follow up.  This format is felt to be most appropriate for this patient at this time.  All issues noted in this document were discussed and addressed.  A limited physical exam was performed with this format.  Please refer to the patient's chart for her consent to telehealth for [facility].   Date:  10/24/2018   ID:  Carolyn Burns, DOB Dec 22, 1940, MRN [id number]  Patient Location: Home Provider Location: Office  PCP:  Gweneth Dimitri, MD  Cardiologist:  Tobias Alexander, MD  Electrophysiologist:  None   Evaluation Performed:  Follow-Up Visit  Chief Complaint:  6 Month f/u for CAD   History of Present Illness:    Carolyn Burns is a 78 y.o. female, followed by Dr. Delton See, with history of CAD status post NSTEMI 01/2016 followed by CABG 3 with LIMA to the LAD, SVG to OM1, SVG to OM 2. She had an ischemic cardiomyopathy w/ prior EF of 35-40% 01/2016. LVF improved post surgical revascularization. Follow-up echo post CABG showed normalization of  EF to 50-55% with mid anterior septal hypokinesis, grade 1 DD, mild mild MR. She also has a h/o PVCs, bradycardia, osteoarthritis and h/o of statin intolerance with elevated LFTs in the past. Her statin was changed to Crestor and she has tolerated this well. Her lipids were all at goal in May 2019 and LFTs were also normal. She was once on high dose  blocker therapy for suppression of PVCs, but developed significant bradycardia w/ HR in the 40s w/ symptoms of dizziness and required down titration of medication. She was last seen by Dr. Delton See 04/2018 and was doing well w/o cardiac  symptoms. No CP and her dizziness/bradycardia had resolved. Dr. Delton See recommended 6 month f/u.   Today, pt reports that she has done well from a symptom standpoint since her last office visit. She denies chest pain and dyspnea. No exertional symptoms w/ exercise or ADLs. Pt also denies orthopnea, PND, LEE, palpitations, dizziness, syncope/ near syncope and claudication. She reports full medication compliance. No intolerances or medication side effects. Pt also denies any hospitalizations,  ED/urgent care visits or surgeries since her last office visit.  She monitors her BP and HR on a regular basis. No recurrent bradycardia. BP is well controlled today and HR in the 60s.   Labs were drawn yesterday. LDL at goal at 46 mg/dL. Hepatic function normal. SCr slightly elevated at 1.2. Pt encouraged to increase fluid intake. TSH also mildly elevated 5.0. She denies any symptoms. No fatigue or weight gain. She has f/u with her PCP in ~4 weeks and will discuss further w/u.    The patient does not have symptoms concerning for COVID-19 infection (fever, chills, cough, or new shortness of breath).    Past Medical History:  Diagnosis Date   CAD (coronary artery disease)    a. 01/2016 NSTEMI/Cath: LM 75, LAD 70ost, LCX 50ost, 70p, LPDA 70, OM1 90;  b. 02/10/2016 CABG x 3 (LIMA->LAD, VG->OM1, VG->OM2).   Ischemic cardiomyopathy    a. 02/02/2016 Echo: EF 35-40% w/ mid-apicalanteroseptal and apical AK, Gr1 DD-->high suspicion for takotsubo;  b. 02/16/2016 Echo (post-cabg): EF 50-55%, midanteroseptal HK, Gr1 DD,  mild MR, PASP , triv effusion.   Osteoarthritis    PVC's (premature ventricular contractions)    a. 02/2016 Freq pvc's noted post-op.   Past Surgical History:  Procedure Laterality Date   CARDIAC CATHETERIZATION N/A 02/01/2016   Procedure: Left Heart Cath and Coronary Angiography;  Surgeon: Lennette Bihari, MD;  Location: [facility] INVASIVE CV LAB;  Service: Cardiovascular;  Laterality: N/A;   CORONARY  ARTERY BYPASS GRAFT N/A 02/10/2016   Procedure: CORONARY ARTERY BYPASS GRAFTING (CABG)x 3 with endoscopic harvesting of right saphenous vein;  Surgeon: [name], MD;  Location: [facility] OR;  Service: Open Heart Surgery;  Laterality: N/A;   TEE WITHOUT CARDIOVERSION N/A 02/10/2016   Procedure: TRANSESOPHAGEAL ECHOCARDIOGRAM (TEE);  Surgeon: [name], MD;  Location: [facility] OR;  Service: Open Heart Surgery;  Laterality: N/A;     Current Meds  Medication Sig   aspirin EC 81 MG EC tablet Take 1 tablet (81 mg total) by mouth daily.   celecoxib (CELEBREX) 200 MG capsule Take 200 mg by mouth daily.   cholecalciferol (VITAMIN D) 1000 units tablet Take 1,000 Units by mouth daily.    estradiol (ESTRACE) 0.5 MG tablet Take 0.5 mg by mouth daily.   Krill Oil 500 MG CAPS Take 1 capsule by mouth daily.   lisinopril (PRINIVIL,ZESTRIL) 2.5 MG tablet Take 1 tablet (2.5 mg total) by mouth daily.   metoprolol tartrate (LOPRESSOR) 25 MG tablet Take 0.5 tablets (12.5 mg total) by mouth 2 (two) times daily.   progesterone (PROMETRIUM) 100 MG capsule Take 100 mg by mouth daily.   ranitidine (ZANTAC) 150 MG tablet Take 150 mg by mouth daily as needed for heartburn.   rosuvastatin (CRESTOR) 20 MG tablet Take 1 tablet (20 mg total) by mouth daily.     Allergies:   Aleve [naproxen sodium] and Codeine   Social History   Tobacco Use   Smoking status: Never Smoker   Smokeless tobacco: Never Used  Substance Use Topics   Alcohol use: No   Drug use: No     Family Hx: The patient's family history is not on file. She was adopted.  ROS:   Please see the history of present illness.     All other systems reviewed and are negative.   Prior CV studies:   The following studies were reviewed today:  2-D echo 02/2016 Study Conclusions - Left ventricle: The cavity size was normal. Wall thickness was normal. Systolic function was normal. The estimated ejection fraction was in the range of 50% to  55%. There is hypokinesis of the midanteroseptal myocardium. Doppler parameters are consistent with abnormal left ventricular relaxation (grade 1 diastolic dysfunction). - Mitral valve: There was mild regurgitation. - Pulmonary arteries: Systolic pressure was mildly increased. PA peak pressure: 33 mm Hg (S). - Pericardium, extracardiac: A trivial pericardial effusion was identified posterior to the heart. There was no evidence of hemodynamic compromise.  Impressions: - EF improved (prior 45%)  Labs/Other Tests and Data Reviewed:    EKG:  An ECG dated 04/25/18 was personally reviewed today and demonstrated:  NSR 63 bpm  Recent Labs: 10/23/2018: ALT 15; BUN 17; Creatinine, Ser 1.27; Hemoglobin 14.5; Platelets 157; Potassium 4.0; Sodium 141; TSH 5.040   Recent Lipid Panel Lab Results  Component Value Date/Time   CHOL 114 10/23/2018 08:07 AM   TRIG 131 10/23/2018 08:07 AM   HDL 42 10/23/2018 08:07 AM   CHOLHDL 2.7 10/23/2018 08:07 AM   CHOLHDL 3.7 04/03/2016 11:04 AM   LDLCALC  46 10/23/2018 08:07 AM    Wt Readings from Last 3 Encounters:  10/24/18 112 lb (50.8 kg)  04/25/18 113 lb (51.3 kg)  10/16/17 114 lb (51.7 kg)     Objective:    Vital Signs:  BP 107/62    Pulse 67    Ht 5\' 1"  (1.549 m)    Wt 112 lb (50.8 kg)    BMI 21.16 kg/m    VITAL SIGNS:  reviewed GEN:  no acute distress EYES:  sclerae anicteric, EOMI - Extraocular Movements Intact RESPIRATORY:  normal respiratory effort, symmetric expansion CARDIOVASCULAR:  no peripheral edema SKIN:  no rash, lesions or ulcers. MUSCULOSKELETAL:  no obvious deformities. NEURO:  alert and oriented x 3, no obvious focal deficit PSYCH:  normal affect  ASSESSMENT & PLAN:    1. CAD: status post NSTEMI 01/2016 followed by CABG 3 with LIMA to the LAD, SVG to OM1, SVG to OM2. Last echo in 2017 showed normal EF. She is doing well. Stable w/o CP. No exertional symptoms. Continue medical therapy.   2. H/o PVCs: was once  on high dose  blocker but developed symptomatic bradycardia (dizziness w/ HR in the 40s).  blocker subsequently reduced. Doing ok on low dose metoprolol. Denies any symptoms of palpitations.   3. H/o Bradycardia: secondary to high dose  blocker therapy. Resolved w/ dose reduction. Denies any further issues. She monitors her BP and HR weekly. No bradycardia. No symptoms.   4. HLD w/ LDL goal < 70 mg/dL: Lipid panel yesterday showed LDL at 46 mg/dL. HFTs WNL. Tolerating Crestor well. No side effects. Will continue current dose.   5. Diastolic Dysfunction: G1 noted on echo in 2017. Denies dyspnea, LEE, orthopnea and PND. BP and HR well controlled w/  blocker.  COVID-19 Education: The signs and symptoms of COVID-19 were discussed with the patient and how to seek care for testing (follow up with PCP or arrange E-visit).  The importance of social distancing was discussed today.  Time:   Today, I have spent 16 minutes with the patient with telehealth technology discussing the above problems.     Medication Adjustments/Labs and Tests Ordered: Current medicines are reviewed at length with the patient today.  Concerns regarding medicines are outlined above.   Tests Ordered: No orders of the defined types were placed in this encounter.   Medication Changes: Meds ordered this encounter  Medications   nitroGLYCERIN (NITROSTAT) 0.4 MG SL tablet    Sig: Place 1 tablet (0.4 mg total) under the tongue every 5 (five) minutes as needed for chest pain.    Dispense:  25 tablet    Refill:  3    Disposition:  Follow up in 6 months with Dr. Delton SeeNelson  Signed, Robbie LisBrittainy Garo [place], PA-C  10/24/2018 8:29 AM    [facility]

## 2018-10-24 ENCOUNTER — Encounter: Payer: Self-pay | Admitting: Cardiology

## 2018-10-24 ENCOUNTER — Telehealth (INDEPENDENT_AMBULATORY_CARE_PROVIDER_SITE_OTHER): Payer: PPO | Admitting: Cardiology

## 2018-10-24 ENCOUNTER — Telehealth: Payer: Self-pay

## 2018-10-24 VITALS — BP 107/62 | HR 67 | Ht 61.0 in | Wt 112.0 lb

## 2018-10-24 DIAGNOSIS — E7800 Pure hypercholesterolemia, unspecified: Secondary | ICD-10-CM

## 2018-10-24 DIAGNOSIS — I5189 Other ill-defined heart diseases: Secondary | ICD-10-CM

## 2018-10-24 DIAGNOSIS — I2510 Atherosclerotic heart disease of native coronary artery without angina pectoris: Secondary | ICD-10-CM | POA: Diagnosis not present

## 2018-10-24 DIAGNOSIS — Z951 Presence of aortocoronary bypass graft: Secondary | ICD-10-CM

## 2018-10-24 MED ORDER — NITROGLYCERIN 0.4 MG SL SUBL: 0.4000 mg | SUBLINGUAL_TABLET | SUBLINGUAL | 3 refills | Status: DC | PRN

## 2018-10-24 NOTE — Patient Instructions (Addendum)
Medication Instructions:  Take Aspirin 81 mg Daily Nitroglycerin has been ordered:  Take As Needed for Chest Pain If you need a refill on your cardiac medications before your next appointment, please call your pharmacy.   Lab work: none If you have labs (blood work) drawn today and your tests are completely normal, you will receive your results only by: Marland Kitchen MyChart Message (if you have MyChart) OR . A paper copy in the mail If you have any lab test that is abnormal or we need to change your treatment, we will call you to review the results.  Testing/Procedures: none  Follow-Up: 6 Months with Dr Delton See At [facility], you and your health needs are our priority.  As part of our continuing mission to provide you with exceptional heart care, we have created designated Provider Care Teams.  These Care Teams include your primary Cardiologist (physician) and Advanced Practice Providers (APPs -  Physician Assistants and Nurse Practitioners) who all work together to provide you with the care you need, when you need it. You will need a follow up appointment in 6 months.  Please call our office 2 months in advance to schedule this appointment.  You may see Tobias Alexander, MD or one of the following Advanced Practice Providers on your designated Care Team:   [place], PA-C Ronie Spies, PA-C . Jacolyn Reedy, PA-C  Any Other Special Instructions Will Be Listed Below (If Applicable).

## 2018-10-24 NOTE — Telephone Encounter (Signed)
I spoke to the patient and reviewed Carolyn Burns's recommendations, along with mailing AVS to her.  She verbalized understanding.

## 2019-01-16 DIAGNOSIS — E785 Hyperlipidemia, unspecified: Secondary | ICD-10-CM | POA: Diagnosis not present

## 2019-01-16 DIAGNOSIS — E559 Vitamin D deficiency, unspecified: Secondary | ICD-10-CM | POA: Diagnosis not present

## 2019-01-16 DIAGNOSIS — I10 Essential (primary) hypertension: Secondary | ICD-10-CM | POA: Diagnosis not present

## 2019-01-16 DIAGNOSIS — R7989 Other specified abnormal findings of blood chemistry: Secondary | ICD-10-CM | POA: Diagnosis not present

## 2019-01-16 DIAGNOSIS — M545 Low back pain: Secondary | ICD-10-CM | POA: Diagnosis not present

## 2019-01-16 DIAGNOSIS — M85851 Other specified disorders of bone density and structure, right thigh: Secondary | ICD-10-CM | POA: Diagnosis not present

## 2019-01-16 DIAGNOSIS — Z0000 Encounter for general adult medical examination without abnormal findings: Secondary | ICD-10-CM | POA: Diagnosis not present

## 2019-01-16 DIAGNOSIS — I2510 Atherosclerotic heart disease of native coronary artery without angina pectoris: Secondary | ICD-10-CM | POA: Diagnosis not present

## 2019-01-16 DIAGNOSIS — R7309 Other abnormal glucose: Secondary | ICD-10-CM | POA: Diagnosis not present

## 2019-01-16 DIAGNOSIS — Z1389 Encounter for screening for other disorder: Secondary | ICD-10-CM | POA: Diagnosis not present

## 2019-01-16 DIAGNOSIS — N951 Menopausal and female climacteric states: Secondary | ICD-10-CM | POA: Diagnosis not present

## 2019-01-16 DIAGNOSIS — Z79899 Other long term (current) drug therapy: Secondary | ICD-10-CM | POA: Diagnosis not present

## 2019-01-22 ENCOUNTER — Other Ambulatory Visit: Payer: Self-pay | Admitting: Family Medicine

## 2019-01-22 DIAGNOSIS — E785 Hyperlipidemia, unspecified: Secondary | ICD-10-CM | POA: Diagnosis not present

## 2019-01-22 DIAGNOSIS — M85851 Other specified disorders of bone density and structure, right thigh: Secondary | ICD-10-CM | POA: Diagnosis not present

## 2019-01-22 DIAGNOSIS — Z1231 Encounter for screening mammogram for malignant neoplasm of breast: Secondary | ICD-10-CM

## 2019-01-22 DIAGNOSIS — N951 Menopausal and female climacteric states: Secondary | ICD-10-CM | POA: Diagnosis not present

## 2019-01-22 DIAGNOSIS — Z79899 Other long term (current) drug therapy: Secondary | ICD-10-CM | POA: Diagnosis not present

## 2019-01-22 DIAGNOSIS — K219 Gastro-esophageal reflux disease without esophagitis: Secondary | ICD-10-CM | POA: Diagnosis not present

## 2019-01-22 DIAGNOSIS — M545 Low back pain: Secondary | ICD-10-CM | POA: Diagnosis not present

## 2019-01-22 DIAGNOSIS — Z0000 Encounter for general adult medical examination without abnormal findings: Secondary | ICD-10-CM | POA: Diagnosis not present

## 2019-01-22 DIAGNOSIS — R7309 Other abnormal glucose: Secondary | ICD-10-CM | POA: Diagnosis not present

## 2019-01-22 DIAGNOSIS — M8580 Other specified disorders of bone density and structure, unspecified site: Secondary | ICD-10-CM

## 2019-01-22 DIAGNOSIS — E559 Vitamin D deficiency, unspecified: Secondary | ICD-10-CM | POA: Diagnosis not present

## 2019-01-22 DIAGNOSIS — R946 Abnormal results of thyroid function studies: Secondary | ICD-10-CM | POA: Diagnosis not present

## 2019-01-22 DIAGNOSIS — Z791 Long term (current) use of non-steroidal anti-inflammatories (NSAID): Secondary | ICD-10-CM | POA: Diagnosis not present

## 2019-01-22 DIAGNOSIS — I2510 Atherosclerotic heart disease of native coronary artery without angina pectoris: Secondary | ICD-10-CM | POA: Diagnosis not present

## 2019-02-19 DIAGNOSIS — I2510 Atherosclerotic heart disease of native coronary artery without angina pectoris: Secondary | ICD-10-CM | POA: Diagnosis not present

## 2019-02-19 DIAGNOSIS — E782 Mixed hyperlipidemia: Secondary | ICD-10-CM | POA: Diagnosis not present

## 2019-02-19 DIAGNOSIS — E785 Hyperlipidemia, unspecified: Secondary | ICD-10-CM | POA: Diagnosis not present

## 2019-02-19 DIAGNOSIS — I10 Essential (primary) hypertension: Secondary | ICD-10-CM | POA: Diagnosis not present

## 2019-03-31 DIAGNOSIS — Z23 Encounter for immunization: Secondary | ICD-10-CM | POA: Diagnosis not present

## 2019-04-01 ENCOUNTER — Telehealth: Payer: Self-pay | Admitting: *Deleted

## 2019-04-01 DIAGNOSIS — Z006 Encounter for examination for normal comparison and control in clinical research program: Secondary | ICD-10-CM

## 2019-04-01 NOTE — Telephone Encounter (Signed)
  [place] EDUInformed Consent  Subject Name:Carolyn Burns  Subject met inclusion and exclusion criteria. The informed consent form, study requirements and expectations were reviewed with the subject and questions and concerns were addressed prior to the signing of the consent form. The subject verbalized understanding of the trial requirements. The subject agreed to participate in the [place] and gave verbal consent to participate in the [place] 03/28/2019. The informed consent was obtained prior to performance of any protocol-specific procedures for the subject. A copy of the signed informed consent was mailedto the subject and a copy was placed in the subject's medical record.  Oletta Cohn.

## 2019-04-29 DIAGNOSIS — I2510 Atherosclerotic heart disease of native coronary artery without angina pectoris: Secondary | ICD-10-CM | POA: Diagnosis not present

## 2019-04-29 DIAGNOSIS — I10 Essential (primary) hypertension: Secondary | ICD-10-CM | POA: Diagnosis not present

## 2019-04-29 DIAGNOSIS — E782 Mixed hyperlipidemia: Secondary | ICD-10-CM | POA: Diagnosis not present

## 2019-04-29 DIAGNOSIS — E785 Hyperlipidemia, unspecified: Secondary | ICD-10-CM | POA: Diagnosis not present

## 2019-05-07 ENCOUNTER — Other Ambulatory Visit: Payer: Self-pay | Admitting: Cardiology

## 2019-05-07 DIAGNOSIS — I25810 Atherosclerosis of coronary artery bypass graft(s) without angina pectoris: Secondary | ICD-10-CM

## 2019-05-07 DIAGNOSIS — I10 Essential (primary) hypertension: Secondary | ICD-10-CM

## 2019-05-07 DIAGNOSIS — E785 Hyperlipidemia, unspecified: Secondary | ICD-10-CM

## 2019-06-05 DIAGNOSIS — E782 Mixed hyperlipidemia: Secondary | ICD-10-CM | POA: Diagnosis not present

## 2019-06-05 DIAGNOSIS — E785 Hyperlipidemia, unspecified: Secondary | ICD-10-CM | POA: Diagnosis not present

## 2019-06-05 DIAGNOSIS — I2510 Atherosclerotic heart disease of native coronary artery without angina pectoris: Secondary | ICD-10-CM | POA: Diagnosis not present

## 2019-06-05 DIAGNOSIS — I10 Essential (primary) hypertension: Secondary | ICD-10-CM | POA: Diagnosis not present

## 2019-07-02 ENCOUNTER — Ambulatory Visit: Payer: PPO

## 2019-07-11 ENCOUNTER — Ambulatory Visit: Payer: PPO | Attending: Internal Medicine

## 2019-07-11 DIAGNOSIS — Z23 Encounter for immunization: Secondary | ICD-10-CM | POA: Insufficient documentation

## 2019-07-11 NOTE — Progress Notes (Signed)
   Covid-19 Vaccination Clinic  Name:  JAVIANA ANWAR    MRN: [id number] DOB: December 13, 1940  07/11/2019  Ms. Freeze was observed post Covid-19 immunization for 15 minutes without incidence. She was provided with Vaccine Information Sheet and instruction to access the V-Safe system.   Ms. Degroote was instructed to call 911 with any severe reactions post vaccine: Marland Kitchen Difficulty breathing  . Swelling of your face and throat  . A fast heartbeat  . A bad rash all over your body  . Dizziness and weakness    Immunizations Administered    Name Date Dose VIS Date Route   Pfizer COVID-19 Vaccine 07/11/2019  1:48 PM 0.3 mL 05/16/2019 Intramuscular   Manufacturer: [employer], [employer]   Lot: [id number]   NDC: [address]-2

## 2019-07-13 ENCOUNTER — Ambulatory Visit: Payer: PPO

## 2019-07-23 DIAGNOSIS — M85851 Other specified disorders of bone density and structure, right thigh: Secondary | ICD-10-CM | POA: Diagnosis not present

## 2019-07-23 DIAGNOSIS — R945 Abnormal results of liver function studies: Secondary | ICD-10-CM | POA: Diagnosis not present

## 2019-07-23 DIAGNOSIS — Z0000 Encounter for general adult medical examination without abnormal findings: Secondary | ICD-10-CM | POA: Diagnosis not present

## 2019-07-23 DIAGNOSIS — Z79899 Other long term (current) drug therapy: Secondary | ICD-10-CM | POA: Diagnosis not present

## 2019-07-23 DIAGNOSIS — E559 Vitamin D deficiency, unspecified: Secondary | ICD-10-CM | POA: Diagnosis not present

## 2019-07-23 DIAGNOSIS — R7309 Other abnormal glucose: Secondary | ICD-10-CM | POA: Diagnosis not present

## 2019-07-28 ENCOUNTER — Ambulatory Visit: Payer: PPO | Admitting: Cardiology

## 2019-07-28 ENCOUNTER — Other Ambulatory Visit: Payer: Self-pay

## 2019-07-28 ENCOUNTER — Encounter: Payer: Self-pay | Admitting: Cardiology

## 2019-07-28 VITALS — BP 130/72 | HR 51 | Ht 61.0 in | Wt 113.0 lb

## 2019-07-28 DIAGNOSIS — Z951 Presence of aortocoronary bypass graft: Secondary | ICD-10-CM

## 2019-07-28 DIAGNOSIS — I2510 Atherosclerotic heart disease of native coronary artery without angina pectoris: Secondary | ICD-10-CM

## 2019-07-28 DIAGNOSIS — E785 Hyperlipidemia, unspecified: Secondary | ICD-10-CM | POA: Diagnosis not present

## 2019-07-28 DIAGNOSIS — I5189 Other ill-defined heart diseases: Secondary | ICD-10-CM | POA: Diagnosis not present

## 2019-07-28 DIAGNOSIS — I10 Essential (primary) hypertension: Secondary | ICD-10-CM

## 2019-07-28 NOTE — Progress Notes (Signed)
Cardiology Office Note:    Date:  07/28/2019   ID:  Carolyn Burns, DOB [birth year], MRN [id number]  PCP:  Gweneth Dimitri, MD  Cardiologist:  Tobias Alexander, MD  Electrophysiologist:  None   Referring MD: Gweneth Dimitri, MD   Reason for visit; months follow-up for CAD  History of Present Illness:    Carolyn Burns is a 79 y.o. female with a hx of CAD status post NSTEMI 01/2016 followed by CABG 3 with LIMA to the LAD, SVG to OM1, SVG to OM 2. She had an ischemic cardiomyopathy w/ prior EF of 35-40% 01/2016. LVF improved post surgical revascularization. Follow-up echo post CABG showed normalization of  EF to 50-55% with mid anterior septal hypokinesis, grade 1 DD, mild mild MR. She also has a h/o PVCs, bradycardia, osteoarthritis and h/o of statin intolerance with elevated LFTs in the past. Her statin was changed to Crestor and she has tolerated this well. Her lipids were all at goal in May 2019 and LFTs were also normal. She was once on high dose  blocker therapy for suppression of PVCs, but developed significant bradycardia w/ HR in the 40s w/ symptoms of dizziness and required down titration of medication. She was last seen by Dr. Delton See 04/2018 and was doing well w/o cardiac symptoms. No CP and her dizziness/bradycardia had resolved. Dr. Delton See recommended 6 month f/u.   Today, pt reports that she has done well from a symptom standpoint since her last office visit. She denies chest pain and dyspnea. No exertional symptoms w/ exercise or ADLs. Pt also denies orthopnea, PND, LEE, palpitations, dizziness, syncope/ near syncope and claudication. She reports full medication compliance. No intolerances or medication side effects. Pt also denies any hospitalizations,  ED/urgent care visits or surgeries since her last office visit.  She monitors her BP and HR on a regular basis. No recurrent bradycardia. BP is well controlled today and HR in the 60s.   Labs were drawn yesterday. LDL at goal at  46 mg/dL. Hepatic function normal. SCr slightly elevated at 1.2. Pt encouraged to increase fluid intake. TSH also mildly elevated 5.0. She denies any symptoms. No fatigue or weight gain. She has f/u with her PCP in ~4 weeks and will discuss further w/u.   07/28/2019 -the patient is coming after 6 months, she has been doing well, she has no symptoms of chest pain or shortness of breath, she has been compliant with all of her medications and denies any myalgias, no bleeding.  She has no lower extremity edema, no claudications no palpitation dizziness or syncope.   Past Medical History:  Diagnosis Date  . CAD (coronary artery disease)    a. 01/2016 NSTEMI/Cath: LM 75, LAD 70ost, LCX 50ost, 70p, LPDA 70, OM1 90;  b. 02/10/2016 CABG x 3 (LIMA->LAD, VG->OM1, VG->OM2).  . Ischemic cardiomyopathy    a. 02/02/2016 Echo: EF 35-40% w/ mid-apicalanteroseptal and apical AK, Gr1 DD-->high suspicion for takotsubo;  b. 02/16/2016 Echo (post-cabg): EF 50-55%, midanteroseptal HK, Gr1 DD, mild MR, PASP , triv effusion.  . Osteoarthritis   . PVC's (premature ventricular contractions)    a. 02/2016 Freq pvc's noted post-op.    Past Surgical History:  Procedure Laterality Date  . CARDIAC CATHETERIZATION N/A 02/01/2016   Procedure: Left Heart Cath and Coronary Angiography;  Surgeon: Lennette Bihari, MD;  Location: [facility] INVASIVE CV LAB;  Service: Cardiovascular;  Laterality: N/A;  . CORONARY ARTERY BYPASS GRAFT N/A 02/10/2016   Procedure: CORONARY ARTERY BYPASS GRAFTING (CABG)x 3  with endoscopic harvesting of right saphenous vein;  Surgeon: [name], MD;  Location: [facility] OR;  Service: Open Heart Surgery;  Laterality: N/A;  . TEE WITHOUT CARDIOVERSION N/A 02/10/2016   Procedure: TRANSESOPHAGEAL ECHOCARDIOGRAM (TEE);  Surgeon: [name], MD;  Location: [facility] OR;  Service: Open Heart Surgery;  Laterality: N/A;    Current Medications: Current Meds  Medication Sig  . aspirin EC 81 MG EC tablet Take 1 tablet (81 mg  total) by mouth daily.  . celecoxib (CELEBREX) 200 MG capsule Take 200 mg by mouth daily.  . cholecalciferol (VITAMIN D) 1000 units tablet Take 1,000 Units by mouth daily.   Marland Kitchen estradiol (ESTRACE) 0.5 MG tablet Take 0.5 mg by mouth daily.  Marland Kitchen lisinopril (ZESTRIL) 2.5 MG tablet TAKE 1 TABLET BY MOUTH EVERY DAY  . metoprolol tartrate (LOPRESSOR) 25 MG tablet TAKE 1/2 TABLET BY MOUTH 2 TIMES DAILY.  . progesterone (PROMETRIUM) 100 MG capsule Take 100 mg by mouth daily.  . ranitidine (ZANTAC) 150 MG tablet Take 150 mg by mouth daily as needed for heartburn.  . rosuvastatin (CRESTOR) 20 MG tablet TAKE 1 TABLET BY MOUTH EVERY DAY     Allergies:   Aleve [naproxen sodium] and Codeine   Social History   Socioeconomic History  . Marital status: Married    Spouse name: Not on file  . Number of children: Not on file  . Years of education: Not on file  . Highest education level: Not on file  Occupational History  . Not on file  Tobacco Use  . Smoking status: Never Smoker  . Smokeless tobacco: Never Used  Substance and Sexual Activity  . Alcohol use: No  . Drug use: No  . Sexual activity: Not on file  Other Topics Concern  . Not on file  Social History Narrative  . Not on file   Social Determinants of Health   Financial Resource Strain:   . Difficulty of Paying Living Expenses: Not on file  Food Insecurity:   . Worried About Programme researcher, broadcasting/film/video in the Last Year: Not on file  . Ran Out of Food in the Last Year: Not on file  Transportation Needs:   . Lack of Transportation (Medical): Not on file  . Lack of Transportation (Non-Medical): Not on file  Physical Activity:   . Days of Exercise per Week: Not on file  . Minutes of Exercise per Session: Not on file  Stress:   . Feeling of Stress : Not on file  Social Connections:   . Frequency of Communication with Friends and Family: Not on file  . Frequency of Social Gatherings with Friends and Family: Not on file  . Attends Religious  Services: Not on file  . Active Member of Clubs or Organizations: Not on file  . Attends Banker Meetings: Not on file  . Marital Status: Not on file     Family History: The patient's family history is not on file. She was adopted.  ROS:   Please see the history of present illness.    All other systems reviewed and are negative.  EKGs/Labs/Other Studies Reviewed:    The following studies were reviewed today:  EKG:  EKG is ordered today.  The ekg ordered today demonstrates bradycardia, otherwise normal EKG and unchanged from prior.  Recent Labs: 10/23/2018: ALT 15; BUN 17; Creatinine, Ser 1.27; Hemoglobin 14.5; Platelets 157; Potassium 4.0; Sodium 141; TSH 5.040  Recent Lipid Panel    Component Value Date/Time  CHOL 114 10/23/2018 0807   TRIG 131 10/23/2018 0807   HDL 42 10/23/2018 0807   CHOLHDL 2.7 10/23/2018 0807   CHOLHDL 3.7 04/03/2016 1104   VLDL 20 04/03/2016 1104   LDLCALC 46 10/23/2018 0807    Physical Exam:    VS:  BP 130/72   Pulse (!) 51   Ht 5\' 1"  (1.549 m)   Wt 113 lb (51.3 kg)   SpO2 98%   BMI 21.35 kg/m     Wt Readings from Last 3 Encounters:  07/28/19 113 lb (51.3 kg)  10/24/18 112 lb (50.8 kg)  04/25/18 113 lb (51.3 kg)     GEN:  Well nourished, well developed in no acute distress HEENT: Normal NECK: No JVD; No carotid bruits LYMPHATICS: No lymphadenopathy CARDIAC: RRR, no murmurs, rubs, gallops RESPIRATORY:  Clear to auscultation without rales, wheezing or rhonchi  ABDOMEN: Soft, non-tender, non-distended MUSCULOSKELETAL:  No edema; No deformity  SKIN: Warm and dry NEUROLOGIC:  Alert and oriented x 3 PSYCHIATRIC:  Normal affect   ASSESSMENT:    1. Coronary artery disease involving native coronary artery of native heart without angina pectoris   2. S/P CABG x 3   3. Diastolic dysfunction   4. Essential hypertension   5. Hyperlipidemia, unspecified hyperlipidemia type    PLAN:    In order of problems listed  above:  1. Coronary artery disease, the patient is asymptomatic, will continue the same regimen.  She is tolerating all of the medications well.  2.  Hypertension -well-controlled on current regimen  3. Hyperlipidemia, well controlled with Crestor 20 mg daily will continue, she has no myalgias.  She obtain labs at her primary care doctor a week ago, we will obtain those labs.  Medication Adjustments/Labs and Tests Ordered: Current medicines are reviewed at length with the patient today.  Concerns regarding medicines are outlined above.  Orders Placed This Encounter  Procedures  . EKG 12-Lead   No orders of the defined types were placed in this encounter.   Patient Instructions  Medication Instructions:   *If you need a refill on your cardiac medications before your next appointment, please call your pharmacy*  Lab Work:  If you have labs (blood work) drawn today and your tests are completely normal, you will receive your results only by: Marland Kitchen MyChart Message (if you have MyChart) OR . A paper copy in the mail If you have any lab test that is abnormal or we need to change your treatment, we will call you to review the results.  Testing/Procedures: None ordered today.  Follow-Up: At [facility], you and your health needs are our priority.  As part of our continuing mission to provide you with exceptional heart care, we have created designated Provider Care Teams.  These Care Teams include your primary Cardiologist (physician) and Advanced Practice Providers (APPs -  Physician Assistants and Nurse Practitioners) who all work together to provide you with the care you need, when you need it.  Your next appointment:   6 month(s)  The format for your next appointment:   In Person  Provider:   You may see Ena Dawley, MD or one of the following Advanced Practice Providers on your designated Care Team:    Melina Copa, PA-C  Ermalinda Barrios, PA-C     Signed, Ena Dawley, MD  07/28/2019 3:46 PM    [place]

## 2019-07-28 NOTE — Patient Instructions (Signed)
Medication Instructions:   *If you need a refill on your cardiac medications before your next appointment, please call your pharmacy*  Lab Work:  If you have labs (blood work) drawn today and your tests are completely normal, you will receive your results only by: . MyChart Message (if you have MyChart) OR . A paper copy in the mail If you have any lab test that is abnormal or we need to change your treatment, we will call you to review the results.  Testing/Procedures: None ordered today.  Follow-Up: At [facility], you and your health needs are our priority.  As part of our continuing mission to provide you with exceptional heart care, we have created designated Provider Care Teams.  These Care Teams include your primary Cardiologist (physician) and Advanced Practice Providers (APPs -  Physician Assistants and Nurse Practitioners) who all work together to provide you with the care you need, when you need it.  Your next appointment:   6 month(s)  The format for your next appointment:   In Person  Provider:   You may see Katarina Nelson, MD or one of the following Advanced Practice Providers on your designated Care Team:    Dayna Dunn, PA-C  Michele Lenze, PA-C   

## 2019-07-29 ENCOUNTER — Other Ambulatory Visit: Payer: Self-pay | Admitting: Cardiology

## 2019-07-29 DIAGNOSIS — I10 Essential (primary) hypertension: Secondary | ICD-10-CM

## 2019-07-29 DIAGNOSIS — I25810 Atherosclerosis of coronary artery bypass graft(s) without angina pectoris: Secondary | ICD-10-CM

## 2019-07-29 DIAGNOSIS — E785 Hyperlipidemia, unspecified: Secondary | ICD-10-CM

## 2019-08-05 ENCOUNTER — Ambulatory Visit: Payer: PPO | Attending: Internal Medicine

## 2019-08-05 DIAGNOSIS — Z23 Encounter for immunization: Secondary | ICD-10-CM | POA: Insufficient documentation

## 2019-08-05 NOTE — Progress Notes (Signed)
   Covid-19 Vaccination Clinic  Name:  Carolyn Burns    MRN: [id number] DOB: 1941/04/03  08/05/2019  [name] was observed post Covid-19 immunization for 15 minutes without incident. She was provided with Vaccine Information Sheet and instruction to access the V-Safe system.   [name] was instructed to call 911 with any severe reactions post vaccine: Marland Kitchen Difficulty breathing  . Swelling of face and throat  . A fast heartbeat  . A bad rash all over body  . Dizziness and weakness   Immunizations Administered    Name Date Dose VIS Date Route   Pfizer COVID-19 Vaccine 08/05/2019  1:47 PM 0.3 mL 05/16/2019 Intramuscular   Manufacturer: [employer], [employer]   Lot: [id number]   NDC: [address]-5

## 2019-09-03 DIAGNOSIS — Z791 Long term (current) use of non-steroidal anti-inflammatories (NSAID): Secondary | ICD-10-CM | POA: Diagnosis not present

## 2019-09-03 DIAGNOSIS — M545 Low back pain: Secondary | ICD-10-CM | POA: Diagnosis not present

## 2020-01-02 DIAGNOSIS — E785 Hyperlipidemia, unspecified: Secondary | ICD-10-CM | POA: Diagnosis not present

## 2020-01-02 DIAGNOSIS — I10 Essential (primary) hypertension: Secondary | ICD-10-CM | POA: Diagnosis not present

## 2020-01-02 DIAGNOSIS — E782 Mixed hyperlipidemia: Secondary | ICD-10-CM | POA: Diagnosis not present

## 2020-01-02 DIAGNOSIS — I2510 Atherosclerotic heart disease of native coronary artery without angina pectoris: Secondary | ICD-10-CM | POA: Diagnosis not present

## 2020-01-12 DIAGNOSIS — Z791 Long term (current) use of non-steroidal anti-inflammatories (NSAID): Secondary | ICD-10-CM | POA: Diagnosis not present

## 2020-01-12 DIAGNOSIS — M85851 Other specified disorders of bone density and structure, right thigh: Secondary | ICD-10-CM | POA: Diagnosis not present

## 2020-01-12 DIAGNOSIS — E559 Vitamin D deficiency, unspecified: Secondary | ICD-10-CM | POA: Diagnosis not present

## 2020-01-12 DIAGNOSIS — R7309 Other abnormal glucose: Secondary | ICD-10-CM | POA: Diagnosis not present

## 2020-01-12 DIAGNOSIS — Z0000 Encounter for general adult medical examination without abnormal findings: Secondary | ICD-10-CM | POA: Diagnosis not present

## 2020-01-12 DIAGNOSIS — M545 Low back pain: Secondary | ICD-10-CM | POA: Diagnosis not present

## 2020-01-12 DIAGNOSIS — R946 Abnormal results of thyroid function studies: Secondary | ICD-10-CM | POA: Diagnosis not present

## 2020-01-12 DIAGNOSIS — Z79899 Other long term (current) drug therapy: Secondary | ICD-10-CM | POA: Diagnosis not present

## 2020-01-16 DIAGNOSIS — E559 Vitamin D deficiency, unspecified: Secondary | ICD-10-CM | POA: Diagnosis not present

## 2020-01-16 DIAGNOSIS — K219 Gastro-esophageal reflux disease without esophagitis: Secondary | ICD-10-CM | POA: Diagnosis not present

## 2020-01-16 DIAGNOSIS — I10 Essential (primary) hypertension: Secondary | ICD-10-CM | POA: Diagnosis not present

## 2020-01-16 DIAGNOSIS — E782 Mixed hyperlipidemia: Secondary | ICD-10-CM | POA: Diagnosis not present

## 2020-01-16 DIAGNOSIS — N951 Menopausal and female climacteric states: Secondary | ICD-10-CM | POA: Diagnosis not present

## 2020-01-16 DIAGNOSIS — R7309 Other abnormal glucose: Secondary | ICD-10-CM | POA: Diagnosis not present

## 2020-01-16 DIAGNOSIS — I2510 Atherosclerotic heart disease of native coronary artery without angina pectoris: Secondary | ICD-10-CM | POA: Diagnosis not present

## 2020-01-16 DIAGNOSIS — Z0000 Encounter for general adult medical examination without abnormal findings: Secondary | ICD-10-CM | POA: Diagnosis not present

## 2020-01-16 DIAGNOSIS — Z1389 Encounter for screening for other disorder: Secondary | ICD-10-CM | POA: Diagnosis not present

## 2020-01-16 DIAGNOSIS — Z01419 Encounter for gynecological examination (general) (routine) without abnormal findings: Secondary | ICD-10-CM | POA: Diagnosis not present

## 2020-01-16 DIAGNOSIS — M545 Low back pain: Secondary | ICD-10-CM | POA: Diagnosis not present

## 2020-01-16 DIAGNOSIS — Z791 Long term (current) use of non-steroidal anti-inflammatories (NSAID): Secondary | ICD-10-CM | POA: Diagnosis not present

## 2020-02-19 DIAGNOSIS — E782 Mixed hyperlipidemia: Secondary | ICD-10-CM | POA: Diagnosis not present

## 2020-02-19 DIAGNOSIS — I2510 Atherosclerotic heart disease of native coronary artery without angina pectoris: Secondary | ICD-10-CM | POA: Diagnosis not present

## 2020-02-19 DIAGNOSIS — I10 Essential (primary) hypertension: Secondary | ICD-10-CM | POA: Diagnosis not present

## 2020-02-19 DIAGNOSIS — E785 Hyperlipidemia, unspecified: Secondary | ICD-10-CM | POA: Diagnosis not present

## 2020-02-25 ENCOUNTER — Ambulatory Visit: Payer: PPO | Admitting: Cardiology

## 2020-02-25 ENCOUNTER — Encounter: Payer: Self-pay | Admitting: Cardiology

## 2020-02-25 ENCOUNTER — Other Ambulatory Visit: Payer: Self-pay

## 2020-02-25 VITALS — BP 118/68 | HR 100 | Ht 61.0 in | Wt 107.0 lb

## 2020-02-25 DIAGNOSIS — I10 Essential (primary) hypertension: Secondary | ICD-10-CM | POA: Diagnosis not present

## 2020-02-25 DIAGNOSIS — E785 Hyperlipidemia, unspecified: Secondary | ICD-10-CM

## 2020-02-25 DIAGNOSIS — Z951 Presence of aortocoronary bypass graft: Secondary | ICD-10-CM | POA: Diagnosis not present

## 2020-02-25 DIAGNOSIS — I2510 Atherosclerotic heart disease of native coronary artery without angina pectoris: Secondary | ICD-10-CM

## 2020-02-25 LAB — COMPREHENSIVE METABOLIC PANEL
ALT: 11 IU/L (ref 0–32)
AST: 13 IU/L (ref 0–40)
Albumin/Globulin Ratio: 2 (ref 1.2–2.2)
Albumin: 4.2 g/dL (ref 3.7–4.7)
Alkaline Phosphatase: 40 IU/L — ABNORMAL LOW (ref 44–121)
BUN/Creatinine Ratio: 14 (ref 12–28)
BUN: 16 mg/dL (ref 8–27)
Bilirubin Total: 0.4 mg/dL (ref 0.0–1.2)
CO2: 23 mmol/L (ref 20–29)
Calcium: 9.4 mg/dL (ref 8.7–10.3)
Chloride: 105 mmol/L (ref 96–106)
Creatinine, Ser: 1.16 mg/dL — ABNORMAL HIGH (ref 0.57–1.00)
GFR calc Af Amer: 52 mL/min/{1.73_m2} — ABNORMAL LOW (ref >59)
GFR calc non Af Amer: 45 mL/min/{1.73_m2} — ABNORMAL LOW (ref >59)
Globulin, Total: 2.1 g/dL (ref 1.5–4.5)
Glucose: 124 mg/dL — ABNORMAL HIGH (ref 65–99)
Potassium: 4 mmol/L (ref 3.5–5.2)
Sodium: 139 mmol/L (ref 134–144)
Total Protein: 6.3 g/dL (ref 6.0–8.5)

## 2020-02-25 LAB — CBC
Hematocrit: 42.2 % (ref 34.0–46.6)
Hemoglobin: 14.4 g/dL (ref 11.1–15.9)
MCH: 30.8 pg (ref 26.6–33.0)
MCHC: 34.1 g/dL (ref 31.5–35.7)
MCV: 90 fL (ref 79–97)
Platelets: 189 10*3/uL (ref 150–450)
RBC: 4.67 x10E6/uL (ref 3.77–5.28)
RDW: 11.8 % (ref 11.7–15.4)
WBC: 9 10*3/uL (ref 3.4–10.8)

## 2020-02-25 LAB — LIPID PANEL
Chol/HDL Ratio: 2.9 ratio (ref 0.0–4.4)
Cholesterol, Total: 124 mg/dL (ref 100–199)
HDL: 43 mg/dL (ref >39)
LDL Chol Calc (NIH): 56 mg/dL (ref 0–99)
Triglycerides: 146 mg/dL (ref 0–149)
VLDL Cholesterol Cal: 25 mg/dL (ref 5–40)

## 2020-02-25 LAB — TSH: TSH: 2.64 u[IU]/mL (ref 0.450–4.500)

## 2020-02-25 NOTE — Progress Notes (Signed)
Cardiology Office Note:    Date:  02/25/2020   ID:  Carolyn Burns, DOB Mar 13, 1941, MRN [id number]  PCP:  Gweneth Dimitri, MD  Cardiologist:  Tobias Alexander, MD  Electrophysiologist:  None   Referring MD: Gweneth Dimitri, MD   Reason for visit: 6 months follow-up  History of Present Illness:    Carolyn Burns is a 79 y.o. female with a hx of CAD status post NSTEMI 01/2016 followed by CABG 3 with LIMA to the LAD, SVG to OM1, SVG to OM 2. She had an ischemic cardiomyopathy w/ prior EF of 35-40% 01/2016. LVF improved post surgical revascularization. Follow-up echo post CABG showed normalization of  EF to 50-55% with mid anterior septal hypokinesis, grade 1 DD, mild mild MR. She also has a h/o PVCs, bradycardia, osteoarthritis and h/o of statin intolerance with elevated LFTs in the past. Her statin was changed to Crestor and she has tolerated this well. Her lipids were all at goal in May 2019 and LFTs were also normal. She was once on high dose  blocker therapy for suppression of PVCs, but developed significant bradycardia w/ HR in the 40s w/ symptoms of dizziness and required down titration of medication.  The patient is coming for regular follow-up after 6 months, she states that she has been doing really well, she is able to perform all activities of daily living without any limitations.  She is a primary caregiver for her husband who has recurrent laryngeal cancer.  She denies any chest pain or shortness of breath.  She denies any lower extremity edema orthopnea or proximal nocturnal dyspnea.  No dizziness presyncope or syncope despite bradycardia cardia.  She has been compliant with her medications and denies myalgias.  Past Medical History:  Diagnosis Date  . CAD (coronary artery disease)    a. 01/2016 NSTEMI/Cath: LM 75, LAD 70ost, LCX 50ost, 70p, LPDA 70, OM1 90;  b. 02/10/2016 CABG x 3 (LIMA->LAD, VG->OM1, VG->OM2).  . Ischemic cardiomyopathy    a. 02/02/2016 Echo: EF 35-40% w/  mid-apicalanteroseptal and apical AK, Gr1 DD-->high suspicion for takotsubo;  b. 02/16/2016 Echo (post-cabg): EF 50-55%, midanteroseptal HK, Gr1 DD, mild MR, PASP , triv effusion.  . Osteoarthritis   . PVC's (premature ventricular contractions)    a. 02/2016 Freq pvc's noted post-op.    Past Surgical History:  Procedure Laterality Date  . CARDIAC CATHETERIZATION N/A 02/01/2016   Procedure: Left Heart Cath and Coronary Angiography;  Surgeon: Lennette Bihari, MD;  Location: [facility] INVASIVE CV LAB;  Service: Cardiovascular;  Laterality: N/A;  . CORONARY ARTERY BYPASS GRAFT N/A 02/10/2016   Procedure: CORONARY ARTERY BYPASS GRAFTING (CABG)x 3 with endoscopic harvesting of right saphenous vein;  Surgeon: [name], MD;  Location: [facility] OR;  Service: Open Heart Surgery;  Laterality: N/A;  . TEE WITHOUT CARDIOVERSION N/A 02/10/2016   Procedure: TRANSESOPHAGEAL ECHOCARDIOGRAM (TEE);  Surgeon: [name], MD;  Location: [facility] OR;  Service: Open Heart Surgery;  Laterality: N/A;    Current Medications: Current Meds  Medication Sig  . aspirin EC 81 MG EC tablet Take 1 tablet (81 mg total) by mouth daily.  . celecoxib (CELEBREX) 200 MG capsule Take 200 mg by mouth daily.  . cholecalciferol (VITAMIN D) 1000 units tablet Take 1,000 Units by mouth daily.   Marland Kitchen estradiol (ESTRACE) 0.5 MG tablet Take 0.5 mg by mouth daily.  Marland Kitchen lisinopril (ZESTRIL) 2.5 MG tablet TAKE 1 TABLET BY MOUTH EVERY DAY  . metoprolol tartrate (LOPRESSOR) 25 MG tablet  TAKE 1/2 TABLET BY MOUTH 2 TIMES DAILY.  . progesterone (PROMETRIUM) 100 MG capsule Take 100 mg by mouth daily.  . ranitidine (ZANTAC) 150 MG tablet Take 150 mg by mouth daily as needed for heartburn.  . rosuvastatin (CRESTOR) 20 MG tablet TAKE 1 TABLET BY MOUTH EVERY DAY     Allergies:   Aleve [naproxen sodium] and Codeine   Social History   Socioeconomic History  . Marital status: Married    Spouse name: Not on file  . Number of children: Not on file  . Years of  education: Not on file  . Highest education level: Not on file  Occupational History  . Not on file  Tobacco Use  . Smoking status: Never Smoker  . Smokeless tobacco: Never Used  Vaping Use  . Vaping Use: Never used  Substance and Sexual Activity  . Alcohol use: No  . Drug use: No  . Sexual activity: Not on file  Other Topics Concern  . Not on file  Social History Narrative  . Not on file   Social Determinants of Health   Financial Resource Strain:   . Difficulty of Paying Living Expenses: Not on file  Food Insecurity:   . Worried About Programme researcher, broadcasting/film/video in the Last Year: Not on file  . Ran Out of Food in the Last Year: Not on file  Transportation Needs:   . Lack of Transportation (Medical): Not on file  . Lack of Transportation (Non-Medical): Not on file  Physical Activity:   . Days of Exercise per Week: Not on file  . Minutes of Exercise per Session: Not on file  Stress:   . Feeling of Stress : Not on file  Social Connections:   . Frequency of Communication with Friends and Family: Not on file  . Frequency of Social Gatherings with Friends and Family: Not on file  . Attends Religious Services: Not on file  . Active Member of Clubs or Organizations: Not on file  . Attends Banker Meetings: Not on file  . Marital Status: Not on file     Family History: The patient's family history is not on file. She was adopted.  ROS:   Please see the history of present illness.    All other systems reviewed and are negative.  EKGs/Labs/Other Studies Reviewed:    The following studies were reviewed today:  EKG:  EKG is ordered today.  The ekg ordered today demonstrates bradycardia, 49 bpm,, otherwise normal EKG and unchanged from prior.  Recent Labs: No results found for requested labs within last 8760 hours.  Recent Lipid Panel    Component Value Date/Time   CHOL 114 10/23/2018 0807   TRIG 131 10/23/2018 0807   HDL 42 10/23/2018 0807   CHOLHDL 2.7  10/23/2018 0807   CHOLHDL 3.7 04/03/2016 1104   VLDL 20 04/03/2016 1104   LDLCALC 46 10/23/2018 0807   Physical Exam:    VS:  BP 118/68   Pulse 100   Ht 5\' 1"  (1.549 m)   Wt 107 lb (48.5 kg)   SpO2 99%   BMI 20.22 kg/m     Wt Readings from Last 3 Encounters:  02/25/20 107 lb (48.5 kg)  07/28/19 113 lb (51.3 kg)  10/24/18 112 lb (50.8 kg)    GEN:  Well nourished, well developed in no acute distress HEENT: Normal NECK: No JVD; No carotid bruits LYMPHATICS: No lymphadenopathy CARDIAC: RRR, no murmurs, rubs, gallops RESPIRATORY:  Clear to auscultation  without rales, wheezing or rhonchi  ABDOMEN: Soft, non-tender, non-distended MUSCULOSKELETAL:  No edema; No deformity  SKIN: Warm and dry NEUROLOGIC:  Alert and oriented x 3 PSYCHIATRIC:  Normal affect   ASSESSMENT:    1. S/P CABG x 3   2. Coronary artery disease involving native coronary artery of native heart without angina pectoris   3. Essential hypertension   4. Hyperlipidemia, unspecified hyperlipidemia type      PLAN:    In order of problems listed above:  1. Coronary artery disease, the patient is active and asymptomatic, will continue the same management including low-dose of metoprolol.  She has no symptoms with bradycardia.  2.  Hypertension -well-controlled, continue current regimen.  3. Hyperlipidemia, we will recheck her lipids today and LFTs, continue same dose of Crestor.  She obtain labs at her primary care doctor a week ago, we will obtain those labs.  Medication Adjustments/Labs and Tests Ordered: Current medicines are reviewed at length with the patient today.  Concerns regarding medicines are outlined above.  Orders Placed This Encounter  Procedures  . Comp Met (CMET)  . CBC  . TSH  . Lipid Profile  . EKG 12-Lead   No orders of the defined types were placed in this encounter.   There are no Patient Instructions on file for this visit.   Signed, Ena Dawley, MD  02/25/2020 9:51 AM     [place]

## 2020-02-25 NOTE — Patient Instructions (Signed)
Medication Instructions:   Your physician recommends that you continue on your current medications as directed. Please refer to the Current Medication list given to you today.  *If you need a refill on your cardiac medications before your next appointment, please call your pharmacy*  Lab Work:  TODAY--CMET, CBC, TSH, AND LIPIDS  If you have labs (blood work) drawn today and your tests are completely normal, you will receive your results only by: . MyChart Message (if you have MyChart) OR . A paper copy in the mail If you have any lab test that is abnormal or we need to change your treatment, we will call you to review the results.   Follow-Up: At [facility], you and your health needs are our priority.  As part of our continuing mission to provide you with exceptional heart care, we have created designated Provider Care Teams.  These Care Teams include your primary Cardiologist (physician) and Advanced Practice Providers (APPs -  Physician Assistants and Nurse Practitioners) who all work together to provide you with the care you need, when you need it.  We recommend signing up for the patient portal called "MyChart".  Sign up information is provided on this After Visit Summary.  MyChart is used to connect with patients for Virtual Visits (Telemedicine).  Patients are able to view lab/test results, encounter notes, upcoming appointments, etc.  Non-urgent messages can be sent to your provider as well.   To learn more about what you can do with MyChart, go to [email or web address].    Your next appointment:   6 month(s)  The format for your next appointment:   In Person  Provider:   Katarina Nelson, MD    

## 2020-05-06 DIAGNOSIS — K219 Gastro-esophageal reflux disease without esophagitis: Secondary | ICD-10-CM | POA: Diagnosis not present

## 2020-05-06 DIAGNOSIS — E782 Mixed hyperlipidemia: Secondary | ICD-10-CM | POA: Diagnosis not present

## 2020-05-06 DIAGNOSIS — E785 Hyperlipidemia, unspecified: Secondary | ICD-10-CM | POA: Diagnosis not present

## 2020-05-06 DIAGNOSIS — I10 Essential (primary) hypertension: Secondary | ICD-10-CM | POA: Diagnosis not present

## 2020-05-06 DIAGNOSIS — I2510 Atherosclerotic heart disease of native coronary artery without angina pectoris: Secondary | ICD-10-CM | POA: Diagnosis not present

## 2020-06-25 DIAGNOSIS — U071 COVID-19: Secondary | ICD-10-CM | POA: Diagnosis not present

## 2020-06-28 ENCOUNTER — Other Ambulatory Visit: Payer: PPO

## 2020-07-09 DIAGNOSIS — E782 Mixed hyperlipidemia: Secondary | ICD-10-CM | POA: Diagnosis not present

## 2020-07-09 DIAGNOSIS — K219 Gastro-esophageal reflux disease without esophagitis: Secondary | ICD-10-CM | POA: Diagnosis not present

## 2020-07-09 DIAGNOSIS — I2510 Atherosclerotic heart disease of native coronary artery without angina pectoris: Secondary | ICD-10-CM | POA: Diagnosis not present

## 2020-07-09 DIAGNOSIS — E785 Hyperlipidemia, unspecified: Secondary | ICD-10-CM | POA: Diagnosis not present

## 2020-07-09 DIAGNOSIS — I10 Essential (primary) hypertension: Secondary | ICD-10-CM | POA: Diagnosis not present

## 2020-07-20 DIAGNOSIS — E782 Mixed hyperlipidemia: Secondary | ICD-10-CM | POA: Diagnosis not present

## 2020-07-20 DIAGNOSIS — Z791 Long term (current) use of non-steroidal anti-inflammatories (NSAID): Secondary | ICD-10-CM | POA: Diagnosis not present

## 2020-08-04 DIAGNOSIS — I2510 Atherosclerotic heart disease of native coronary artery without angina pectoris: Secondary | ICD-10-CM | POA: Diagnosis not present

## 2020-08-04 DIAGNOSIS — E782 Mixed hyperlipidemia: Secondary | ICD-10-CM | POA: Diagnosis not present

## 2020-08-04 DIAGNOSIS — I10 Essential (primary) hypertension: Secondary | ICD-10-CM | POA: Diagnosis not present

## 2020-08-04 DIAGNOSIS — K219 Gastro-esophageal reflux disease without esophagitis: Secondary | ICD-10-CM | POA: Diagnosis not present

## 2020-08-04 DIAGNOSIS — E785 Hyperlipidemia, unspecified: Secondary | ICD-10-CM | POA: Diagnosis not present

## 2020-08-07 ENCOUNTER — Other Ambulatory Visit: Payer: Self-pay | Admitting: Cardiology

## 2020-08-07 DIAGNOSIS — I25810 Atherosclerosis of coronary artery bypass graft(s) without angina pectoris: Secondary | ICD-10-CM

## 2020-08-07 DIAGNOSIS — I10 Essential (primary) hypertension: Secondary | ICD-10-CM

## 2020-08-07 DIAGNOSIS — E785 Hyperlipidemia, unspecified: Secondary | ICD-10-CM

## 2020-08-08 ENCOUNTER — Other Ambulatory Visit: Payer: Self-pay | Admitting: Cardiology

## 2020-08-08 DIAGNOSIS — I10 Essential (primary) hypertension: Secondary | ICD-10-CM

## 2020-08-08 DIAGNOSIS — I25810 Atherosclerosis of coronary artery bypass graft(s) without angina pectoris: Secondary | ICD-10-CM

## 2020-08-08 DIAGNOSIS — E785 Hyperlipidemia, unspecified: Secondary | ICD-10-CM

## 2020-08-26 ENCOUNTER — Encounter: Payer: Self-pay | Admitting: Cardiology

## 2020-08-26 ENCOUNTER — Other Ambulatory Visit: Payer: Self-pay

## 2020-08-26 ENCOUNTER — Ambulatory Visit: Payer: PPO | Admitting: Cardiology

## 2020-08-26 VITALS — BP 122/68 | HR 56 | Ht 61.0 in | Wt 101.4 lb

## 2020-08-26 DIAGNOSIS — Z951 Presence of aortocoronary bypass graft: Secondary | ICD-10-CM | POA: Diagnosis not present

## 2020-08-26 DIAGNOSIS — I2510 Atherosclerotic heart disease of native coronary artery without angina pectoris: Secondary | ICD-10-CM

## 2020-08-26 DIAGNOSIS — E785 Hyperlipidemia, unspecified: Secondary | ICD-10-CM | POA: Diagnosis not present

## 2020-08-26 DIAGNOSIS — R0989 Other specified symptoms and signs involving the circulatory and respiratory systems: Secondary | ICD-10-CM | POA: Diagnosis not present

## 2020-08-26 NOTE — Patient Instructions (Signed)
Medication Instructions:   Your physician recommends that you continue on your current medications as directed. Please refer to the Current Medication list given to you today.  *If you need a refill on your cardiac medications before your next appointment, please call your pharmacy*   Testing/Procedures:  Your physician has requested that you have a carotid duplex. This test is an ultrasound of the carotid arteries in your neck. It looks at blood flow through these arteries that supply the brain with blood. Allow one hour for this exam. There are no restrictions or special instructions.  Follow-Up: At [facility], you and your health needs are our priority.  As part of our continuing mission to provide you with exceptional heart care, we have created designated Provider Care Teams.  These Care Teams include your primary Cardiologist (physician) and Advanced Practice Providers (APPs -  Physician Assistants and Nurse Practitioners) who all work together to provide you with the care you need, when you need it.  We recommend signing up for the patient portal called "MyChart".  Sign up information is provided on this After Visit Summary.  MyChart is used to connect with patients for Virtual Visits (Telemedicine).  Patients are able to view lab/test results, encounter notes, upcoming appointments, etc.  Non-urgent messages can be sent to your provider as well.   To learn more about what you can do with MyChart, go to [email or web address].    Your next appointment:   8 month(s)  The format for your next appointment:   In Person  Provider:   Laurance Flatten, MD

## 2020-08-26 NOTE — Progress Notes (Signed)
Cardiology Office Note:    Date:  08/26/2020   ID:  Carolyn Burns, DOB Aug 02, 1940, MRN [id number]  PCP:  Gweneth Dimitri, MD  Cardiologist:  Tobias Alexander, MD  Electrophysiologist:  None   Referring MD: Gweneth Dimitri, MD   Reason for visit: 6 months follow-up  History of Present Illness:    Carolyn Burns is a 80 y.o. female with a hx of CAD status post NSTEMI 01/2016 followed by CABG 3 with LIMA to the LAD, SVG to OM1, SVG to OM 2. She had an ischemic cardiomyopathy w/ prior EF of 35-40% 01/2016. LVF improved post surgical revascularization. Follow-up echo post CABG showed normalization of  EF to 50-55% with mid anterior septal hypokinesis, grade 1 DD, mild mild MR. She also has a h/o PVCs, bradycardia, osteoarthritis and h/o of statin intolerance with elevated LFTs in the past. Her statin was changed to Crestor and she has tolerated this well. Her lipids were all at goal in May 2019 and LFTs were also normal. She was once on high dose  blocker therapy for suppression of PVCs, but developed significant bradycardia w/ HR in the 40s w/ symptoms of dizziness and required down titration of medication.  This is a 6 months follow-up for this patient, she has personally been doing well denies any chest pain or shortness of breath, no dizziness no palpitations no falls.  She has been compliant with her meds and has no side effects.  However she is overwhelmed as her husband has recurrent cancer and she is his only caregiver.  Past Medical History:  Diagnosis Date  . CAD (coronary artery disease)    a. 01/2016 NSTEMI/Cath: LM 75, LAD 70ost, LCX 50ost, 70p, LPDA 70, OM1 90;  b. 02/10/2016 CABG x 3 (LIMA->LAD, VG->OM1, VG->OM2).  . Ischemic cardiomyopathy    a. 02/02/2016 Echo: EF 35-40% w/ mid-apicalanteroseptal and apical AK, Gr1 DD-->high suspicion for takotsubo;  b. 02/16/2016 Echo (post-cabg): EF 50-55%, midanteroseptal HK, Gr1 DD, mild MR, PASP , triv effusion.  . Osteoarthritis    . PVC's (premature ventricular contractions)    a. 02/2016 Freq pvc's noted post-op.    Past Surgical History:  Procedure Laterality Date  . CARDIAC CATHETERIZATION N/A 02/01/2016   Procedure: Left Heart Cath and Coronary Angiography;  Surgeon: Lennette Bihari, MD;  Location: [facility] INVASIVE CV LAB;  Service: Cardiovascular;  Laterality: N/A;  . CORONARY ARTERY BYPASS GRAFT N/A 02/10/2016   Procedure: CORONARY ARTERY BYPASS GRAFTING (CABG)x 3 with endoscopic harvesting of right saphenous vein;  Surgeon: [name], MD;  Location: [facility] OR;  Service: Open Heart Surgery;  Laterality: N/A;  . TEE WITHOUT CARDIOVERSION N/A 02/10/2016   Procedure: TRANSESOPHAGEAL ECHOCARDIOGRAM (TEE);  Surgeon: [name], MD;  Location: [facility] OR;  Service: Open Heart Surgery;  Laterality: N/A;    Current Medications: Current Meds  Medication Sig  . aspirin EC 81 MG EC tablet Take 1 tablet (81 mg total) by mouth daily.  . celecoxib (CELEBREX) 200 MG capsule Take 200 mg by mouth daily.  . cholecalciferol (VITAMIN D) 1000 units tablet Take 1,000 Units by mouth daily.   Marland Kitchen estradiol (ESTRACE) 0.5 MG tablet Take 0.5 mg by mouth daily.  Marland Kitchen lisinopril (ZESTRIL) 2.5 MG tablet TAKE 1 TABLET BY MOUTH EVERY DAY  . metoprolol tartrate (LOPRESSOR) 25 MG tablet TAKE 1/2 TABLET BY MOUTH 2 TIMES DAILY.  . progesterone (PROMETRIUM) 100 MG capsule Take 100 mg by mouth daily.  . ranitidine (ZANTAC) 150 MG tablet Take  150 mg by mouth daily as needed for heartburn.  . rosuvastatin (CRESTOR) 20 MG tablet TAKE 1 TABLET BY MOUTH EVERY DAY     Allergies:   Aleve [naproxen sodium] and Codeine   Social History   Socioeconomic History  . Marital status: Married    Spouse name: Not on file  . Number of children: Not on file  . Years of education: Not on file  . Highest education level: Not on file  Occupational History  . Not on file  Tobacco Use  . Smoking status: Never Smoker  . Smokeless tobacco: Never Used  Vaping Use  .  Vaping Use: Never used  Substance and Sexual Activity  . Alcohol use: No  . Drug use: No  . Sexual activity: Not on file  Other Topics Concern  . Not on file  Social History Narrative  . Not on file   Social Determinants of Health   Financial Resource Strain: Not on file  Food Insecurity: Not on file  Transportation Needs: Not on file  Physical Activity: Not on file  Stress: Not on file  Social Connections: Not on file     Family History: The patient's family history is not on file. She was adopted.  ROS:   Please see the history of present illness.    All other systems reviewed and are negative.  EKGs/Labs/Other Studies Reviewed:    The following studies were reviewed today:  EKG:  EKG is ordered today.  The ekg ordered today and it shows sinus rhythm with PVCs, unchanged from prior.  This was personally reviewed.  Recent Labs: 02/25/2020: ALT 11; BUN 16; Creatinine, Ser 1.16; Hemoglobin 14.4; Platelets 189; Potassium 4.0; Sodium 139; TSH 2.640  Recent Lipid Panel    Component Value Date/Time   CHOL 124 02/25/2020 1016   TRIG 146 02/25/2020 1016   HDL 43 02/25/2020 1016   CHOLHDL 2.9 02/25/2020 1016   CHOLHDL 3.7 04/03/2016 1104   VLDL 20 04/03/2016 1104   LDLCALC 56 02/25/2020 1016   Physical Exam:    VS:  BP 122/68   Pulse (!) 56   Ht 5\' 1"  (1.549 m)   Wt 101 lb 6.4 oz (46 kg)   SpO2 98%   BMI 19.16 kg/m     Wt Readings from Last 3 Encounters:  08/26/20 101 lb 6.4 oz (46 kg)  02/25/20 107 lb (48.5 kg)  07/28/19 113 lb (51.3 kg)    GEN:  Well nourished, well developed in no acute distress HEENT: Normal NECK: No JVD; No carotid bruits LYMPHATICS: No lymphadenopathy CARDIAC: RRR, no murmurs, rubs, gallops RESPIRATORY:  Clear to auscultation without rales, wheezing or rhonchi  ABDOMEN: Soft, non-tender, non-distended MUSCULOSKELETAL:  No edema; No deformity  SKIN: Warm and dry NEUROLOGIC:  Alert and oriented x 3 PSYCHIATRIC:  Normal affect    ASSESSMENT:    1. Coronary artery disease involving native coronary artery of native heart without angina pectoris   2. S/P CABG x 3   3. Hyperlipidemia, unspecified hyperlipidemia type   4. Right carotid bruit      PLAN:    In order of problems listed above:  1. Coronary artery disease, the patient is active and asymptomatic, he will continue rosuvastatin, lisinopril, aspirin and metoprolol.  2.  Hypertension -controlled and no episodes of orthostatic hypotension.  3. Hyperlipidemia, all lipids at goal, she tolerates rosuvastatin well.  She obtain labs at her primary care doctor a week ago, we will obtain those labs.  Medication Adjustments/Labs and Tests Ordered: Current medicines are reviewed at length with the patient today.  Concerns regarding medicines are outlined above.  Orders Placed This Encounter  Procedures  . EKG 12-Lead  . VAS US CAROTID   No orders of the defined types were placed in this encounter.   Patient Instructions  Medication Instructions:   Your physician recommends that you continue on your current medications as directed. Please refer to the Current Medication list given to you today.  *If you need a refill on your cardiac medications before your next appointment, please call your pharmacy*   Testing/Procedures:  Your physician has requested that you have a carotid duplex. This test is an ultrasound of the carotid arteries in your neck. It looks at blood flow through these arteries that supply the brain with blood. Allow one hour for this exam. There are no restrictions or special instructions.  Follow-Up: At [facility], you and your health needs are our priority.  As part of our continuing mission to provide you with exceptional heart care, we have created designated Provider Care Teams.  These Care Teams include your primary Cardiologist (physician) and Advanced Practice Providers (APPs -  Physician Assistants and Nurse Practitioners) who  all work together to provide you with the care you need, when you need it.  We recommend signing up for the patient portal called "MyChart".  Sign up information is provided on this After Visit Summary.  MyChart is used to connect with patients for Virtual Visits (Telemedicine).  Patients are able to view lab/test results, encounter notes, upcoming appointments, etc.  Non-urgent messages can be sent to your provider as well.   To learn more about what you can do with MyChart, go to [email or web address].    Your next appointment:   8 month(s)  The format for your next appointment:   In Person  Provider:   Laurance Flatten, MD

## 2020-09-02 ENCOUNTER — Ambulatory Visit (HOSPITAL_COMMUNITY)
Admission: RE | Admit: 2020-09-02 | Discharge: 2020-09-02 | Disposition: A | Discharge status: Home / Self Care | Payer: PPO | Source: Ambulatory Visit | Attending: Cardiology | Admitting: Cardiology

## 2020-09-02 ENCOUNTER — Other Ambulatory Visit: Payer: Self-pay

## 2020-09-02 DIAGNOSIS — R0989 Other specified symptoms and signs involving the circulatory and respiratory systems: Secondary | ICD-10-CM | POA: Diagnosis not present

## 2020-10-25 DIAGNOSIS — K219 Gastro-esophageal reflux disease without esophagitis: Secondary | ICD-10-CM | POA: Diagnosis not present

## 2020-10-25 DIAGNOSIS — E785 Hyperlipidemia, unspecified: Secondary | ICD-10-CM | POA: Diagnosis not present

## 2020-10-25 DIAGNOSIS — I2510 Atherosclerotic heart disease of native coronary artery without angina pectoris: Secondary | ICD-10-CM | POA: Diagnosis not present

## 2020-10-25 DIAGNOSIS — I10 Essential (primary) hypertension: Secondary | ICD-10-CM | POA: Diagnosis not present

## 2020-10-25 DIAGNOSIS — E782 Mixed hyperlipidemia: Secondary | ICD-10-CM | POA: Diagnosis not present

## 2021-01-13 DIAGNOSIS — E782 Mixed hyperlipidemia: Secondary | ICD-10-CM | POA: Diagnosis not present

## 2021-01-13 DIAGNOSIS — R7301 Impaired fasting glucose: Secondary | ICD-10-CM | POA: Diagnosis not present

## 2021-01-13 DIAGNOSIS — Z791 Long term (current) use of non-steroidal anti-inflammatories (NSAID): Secondary | ICD-10-CM | POA: Diagnosis not present

## 2021-01-13 DIAGNOSIS — K219 Gastro-esophageal reflux disease without esophagitis: Secondary | ICD-10-CM | POA: Diagnosis not present

## 2021-01-13 DIAGNOSIS — E559 Vitamin D deficiency, unspecified: Secondary | ICD-10-CM | POA: Diagnosis not present

## 2021-01-13 DIAGNOSIS — I2510 Atherosclerotic heart disease of native coronary artery without angina pectoris: Secondary | ICD-10-CM | POA: Diagnosis not present

## 2021-01-13 DIAGNOSIS — M85851 Other specified disorders of bone density and structure, right thigh: Secondary | ICD-10-CM | POA: Diagnosis not present

## 2021-01-13 DIAGNOSIS — R7309 Other abnormal glucose: Secondary | ICD-10-CM | POA: Diagnosis not present

## 2021-01-13 DIAGNOSIS — I10 Essential (primary) hypertension: Secondary | ICD-10-CM | POA: Diagnosis not present

## 2021-01-13 DIAGNOSIS — Z1389 Encounter for screening for other disorder: Secondary | ICD-10-CM | POA: Diagnosis not present

## 2021-01-13 DIAGNOSIS — N951 Menopausal and female climacteric states: Secondary | ICD-10-CM | POA: Diagnosis not present

## 2021-01-13 DIAGNOSIS — Z0000 Encounter for general adult medical examination without abnormal findings: Secondary | ICD-10-CM | POA: Diagnosis not present

## 2021-01-13 DIAGNOSIS — Z79899 Other long term (current) drug therapy: Secondary | ICD-10-CM | POA: Diagnosis not present

## 2021-01-13 DIAGNOSIS — M5451 Vertebrogenic low back pain: Secondary | ICD-10-CM | POA: Diagnosis not present

## 2021-03-18 DIAGNOSIS — Z1389 Encounter for screening for other disorder: Secondary | ICD-10-CM | POA: Diagnosis not present

## 2021-03-18 DIAGNOSIS — M545 Low back pain, unspecified: Secondary | ICD-10-CM | POA: Diagnosis not present

## 2021-03-18 DIAGNOSIS — I2510 Atherosclerotic heart disease of native coronary artery without angina pectoris: Secondary | ICD-10-CM | POA: Diagnosis not present

## 2021-03-18 DIAGNOSIS — G8929 Other chronic pain: Secondary | ICD-10-CM | POA: Diagnosis not present

## 2021-03-18 DIAGNOSIS — I10 Essential (primary) hypertension: Secondary | ICD-10-CM | POA: Diagnosis not present

## 2021-03-18 DIAGNOSIS — Z791 Long term (current) use of non-steroidal anti-inflammatories (NSAID): Secondary | ICD-10-CM | POA: Diagnosis not present

## 2021-03-18 DIAGNOSIS — E538 Deficiency of other specified B group vitamins: Secondary | ICD-10-CM | POA: Diagnosis not present

## 2021-03-18 DIAGNOSIS — E782 Mixed hyperlipidemia: Secondary | ICD-10-CM | POA: Diagnosis not present

## 2021-03-18 DIAGNOSIS — N951 Menopausal and female climacteric states: Secondary | ICD-10-CM | POA: Diagnosis not present

## 2021-03-18 DIAGNOSIS — Z23 Encounter for immunization: Secondary | ICD-10-CM | POA: Diagnosis not present

## 2021-03-18 DIAGNOSIS — Z0000 Encounter for general adult medical examination without abnormal findings: Secondary | ICD-10-CM | POA: Diagnosis not present

## 2021-03-18 DIAGNOSIS — R7301 Impaired fasting glucose: Secondary | ICD-10-CM | POA: Diagnosis not present

## 2021-03-31 DIAGNOSIS — Z20822 Contact with and (suspected) exposure to covid-19: Secondary | ICD-10-CM | POA: Diagnosis not present

## 2021-04-25 ENCOUNTER — Ambulatory Visit (HOSPITAL_BASED_OUTPATIENT_CLINIC_OR_DEPARTMENT_OTHER): Payer: PPO | Admitting: Family

## 2021-04-25 ENCOUNTER — Encounter (HOSPITAL_BASED_OUTPATIENT_CLINIC_OR_DEPARTMENT_OTHER): Payer: Self-pay | Admitting: Family

## 2021-04-25 ENCOUNTER — Other Ambulatory Visit: Payer: Self-pay

## 2021-04-25 VITALS — BP 126/66 | HR 48 | Ht 61.0 in | Wt 104.0 lb

## 2021-04-25 DIAGNOSIS — I25118 Atherosclerotic heart disease of native coronary artery with other forms of angina pectoris: Secondary | ICD-10-CM | POA: Diagnosis not present

## 2021-04-25 DIAGNOSIS — I25810 Atherosclerosis of coronary artery bypass graft(s) without angina pectoris: Secondary | ICD-10-CM | POA: Diagnosis not present

## 2021-04-25 DIAGNOSIS — I10 Essential (primary) hypertension: Secondary | ICD-10-CM | POA: Diagnosis not present

## 2021-04-25 DIAGNOSIS — R001 Bradycardia, unspecified: Secondary | ICD-10-CM

## 2021-04-25 DIAGNOSIS — E785 Hyperlipidemia, unspecified: Secondary | ICD-10-CM

## 2021-04-25 DIAGNOSIS — I6523 Occlusion and stenosis of bilateral carotid arteries: Secondary | ICD-10-CM | POA: Diagnosis not present

## 2021-04-25 MED ORDER — ROSUVASTATIN CALCIUM 20 MG PO TABS: 20.0000 mg | ORAL_TABLET | Freq: Every day | ORAL | 3 refills | Status: DC

## 2021-04-25 MED ORDER — LISINOPRIL 2.5 MG PO TABS: 2.5000 mg | ORAL_TABLET | Freq: Every day | ORAL | 3 refills | Status: DC

## 2021-04-25 NOTE — Patient Instructions (Signed)
Medication Instructions:  Your physician has recommended you make the following change in your medication:   STOP: Metoprolol  Refills for Crestor and Lisinopril have been sent in for you   *If you need a refill on your cardiac medications before your next appointment, please call your pharmacy*   Lab Work: None ordered today   Testing/Procedures: None ordered today    Follow-Up: At [facility], you and your health needs are our priority.  As part of our continuing mission to provide you with exceptional heart care, we have created designated Provider Care Teams.  These Care Teams include your primary Cardiologist (physician) and Advanced Practice Providers (APPs -  Physician Assistants and Nurse Practitioners) who all work together to provide you with the care you need, when you need it.  We recommend signing up for the patient portal called "MyChart".  Sign up information is provided on this After Visit Summary.  MyChart is used to connect with patients for Virtual Visits (Telemedicine).  Patients are able to view lab/test results, encounter notes, upcoming appointments, etc.  Non-urgent messages can be sent to your provider as well.   To learn more about what you can do with MyChart, go to [email or web address].    Your next appointment:   6 month(s)  The format for your next appointment:   In Person  Provider:   Meriam Sprague, MD     Other Instructions None today

## 2021-04-25 NOTE — Progress Notes (Signed)
Office Visit    Patient Name: Carolyn Burns Date of Encounter: 04/25/2021  PCP:  Gweneth Dimitri, MD    [facility]  Cardiologist:  Meriam Sprague, MD  Advanced Practice Provider:  No care team member to display Electrophysiologist:  None      Chief Complaint    Carolyn Burns is a 80 y.o. female with a hx of CAD, HTN, HLD presents today for follow up of CAD   Past Medical History    Past Medical History:  Diagnosis Date   CAD (coronary artery disease)    a. 01/2016 NSTEMI/Cath: LM 75, LAD 70ost, LCX 50ost, 70p, LPDA 70, OM1 90;  b. 02/10/2016 CABG x 3 (LIMA->LAD, VG->OM1, VG->OM2).   Ischemic cardiomyopathy    a. 02/02/2016 Echo: EF 35-40% w/ mid-apicalanteroseptal and apical AK, Gr1 DD-->high suspicion for takotsubo;  b. 02/16/2016 Echo (post-cabg): EF 50-55%, midanteroseptal HK, Gr1 DD, mild MR, PASP , triv effusion.   Osteoarthritis    PVC's (premature ventricular contractions)    a. 02/2016 Freq pvc's noted post-op.   Past Surgical History:  Procedure Laterality Date   CARDIAC CATHETERIZATION N/A 02/01/2016   Procedure: Left Heart Cath and Coronary Angiography;  Surgeon: Lennette Bihari, MD;  Location: [facility] INVASIVE CV LAB;  Service: Cardiovascular;  Laterality: N/A;   CORONARY ARTERY BYPASS GRAFT N/A 02/10/2016   Procedure: CORONARY ARTERY BYPASS GRAFTING (CABG)x 3 with endoscopic harvesting of right saphenous vein;  Surgeon: [name], MD;  Location: [facility] OR;  Service: Open Heart Surgery;  Laterality: N/A;   TEE WITHOUT CARDIOVERSION N/A 02/10/2016   Procedure: TRANSESOPHAGEAL ECHOCARDIOGRAM (TEE);  Surgeon: [name], MD;  Location: [facility] OR;  Service: Open Heart Surgery;  Laterality: N/A;    Allergies  Allergies  Allergen Reactions   Aleve [Naproxen Sodium] Itching and Other (See Comments)    Reaction:  Redness on palms of hands/bottom of feet    Codeine Nausea Only    History of Present Illness    Carolyn Burns  is a 80 y.o. female with a hx of CAD, HTN, HLD last seen 08/2020 by Dr. Delton See.  Her CAD dates back to NSTEMI 01/2016 followed by CABGx3 (LIMA-LAD, SVG-OM1, SVG-OM2). She had ischemic cardiomyopathy with previous EF 35-40% in 01/2016. LVEF improved post surgical revascularization with repeat echo LVEF 50-55%, mid anterior septal hypokinesis, grade 1 diastolic dysfunction, mild MR. Previously required down titration of Metoprolol due to bradycardia.   Very pleasant lady who presents today for follow up. 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 He passed away in Nov 02, 2022 from cancer, offered my condolences. 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 Reports no shortness of breath nor dyspnea on exertion. Reports no chest pain, pressure, or tightness. No edema, orthopnea, PND. Reports no palpitations.    EKGs/Labs/Other Studies Reviewed:   The following studies were reviewed today:  EKG:  EKG is ordered today.  The ekg ordered today demonstrates sinus bradycardia 48 bpm with no acute ST/T wave changes.  Recent Labs: No results found for requested labs within last 8760 hours.  Recent Lipid Panel    Component Value Date/Time   CHOL 124 02/25/2020 1016   TRIG 146 02/25/2020 1016   HDL 43 02/25/2020 1016   CHOLHDL 2.9 02/25/2020 1016   CHOLHDL 3.7 04/03/2016 1104   VLDL 20 04/03/2016 1104   LDLCALC 56 02/25/2020 1016     Home Medications   Current Meds  Medication Sig   aspirin EC 81 MG EC tablet Take 1 tablet (81 mg total) by mouth daily.   celecoxib (CELEBREX) 200 MG capsule Take 200 mg by mouth daily.   cholecalciferol (VITAMIN D) 1000 units tablet Take 1,000 Units by mouth daily.    estradiol (ESTRACE) 0.5 MG tablet Take 0.5 mg by mouth daily.   lisinopril (ZESTRIL) 2.5 MG tablet TAKE 1 TABLET BY MOUTH EVERY DAY   metoprolol tartrate (LOPRESSOR) 25 MG tablet TAKE 1/2 TABLET BY MOUTH 2 TIMES DAILY.   progesterone (PROMETRIUM) 100 MG capsule  Take 100 mg by mouth daily.   ranitidine (ZANTAC) 150 MG tablet Take 150 mg by mouth daily as needed for heartburn.   rosuvastatin (CRESTOR) 20 MG tablet TAKE 1 TABLET BY MOUTH EVERY DAY     Review of Systems      All other systems reviewed and are otherwise negative except as noted above.  Physical Exam    VS:  BP 126/66   Pulse (!) 48   Ht 5\' 1"  (1.549 m)   Wt 104 lb (47.2 kg)   BMI 19.65 kg/m  , BMI Body mass index is 19.65 kg/m.  Wt Readings from Last 3 Encounters:  04/25/21 104 lb (47.2 kg)  08/26/20 101 lb 6.4 oz (46 kg)  02/25/20 107 lb (48.5 kg)     GEN: Well nourished, well developed, in no acute distress. HEENT: normal. Neck: Supple, no JVD, carotid bruits, or masses. Cardiac: RRR, bradycardic, no murmurs, rubs, or gallops. No clubbing, cyanosis, edema.  Radials/PT 2+ and equal bilaterally.  Respiratory:  Respirations regular and unlabored, clear to auscultation bilaterally. GI: Soft, nontender, nondistended. MS: No deformity or atrophy. Skin: Warm and dry, no rash. Neuro:  Strength and sensation are intact. Psych: Normal affect.  Assessment & Plan    CAD - Stable with no anginal symptoms. No indication for ischemic evaluation.  GDMT includes aspirin, crestor, PRN nitroglycerin. No beta blocker due to bradycardia. Heart healthy diet and regular cardiovascular exercise encouraged.    Bradycardia - EKG today SB 48 bpm. Asymptomatic no lightheadedness, dizziness. Discontinue Metoprolol tartrate. If bradycardia persists at follow up, consider ZIO monitor.   HTN - BP well controlled. Continue current antihypertensive regimen.    HLD - 07/2020 LDL 58. Continue Crestor 20mg  QD. Refills provided.  Carotid artery stenosis - Carotid duplex 08/2020 with bilateral 1-39% stenosis. No amaurosis fugax nor syncope. Continue aspirin, Crestor. Consider repeat duplex in 1-2 years or as clinically indicated.   Disposition: Follow up in 6 month(s) with , MD or  APP.  Signed, 09/2020, NP 04/25/2021, 9:02 AM [facility]

## 2021-06-17 DIAGNOSIS — E538 Deficiency of other specified B group vitamins: Secondary | ICD-10-CM | POA: Diagnosis not present

## 2021-09-15 DIAGNOSIS — E538 Deficiency of other specified B group vitamins: Secondary | ICD-10-CM | POA: Diagnosis not present

## 2021-09-15 DIAGNOSIS — Z79899 Other long term (current) drug therapy: Secondary | ICD-10-CM | POA: Diagnosis not present

## 2021-11-27 NOTE — Progress Notes (Signed)
Cardiology Office Note:    Date:  12/09/2021   ID:  Carolyn Burns, DOB Jan 01, 1941, MRN [id number]  PCP:  Gweneth Dimitri, MD   [facility] Providers Cardiologist:  Meriam Sprague, MD {   Referring MD: Gweneth Dimitri, MD    History of Present Illness:    Carolyn Burns is a 81 y.o. female with a hx of CAD s/p CABG in 2017, HTN, and HLD who was previously followed by Dr. Delton See who now returns to clinic for follow-up.   Per review of the record, the patient was hospitalized with NSTEMI in 2017 found to have severe multivessel CAD s/p CABGx3 (LIMA-LAD, SVG-OM1, SVG-OM2). She had ischemic cardiomyopathy with previous EF 35-40% in 01/2016. LVEF improved post surgical revascularization with repeat echo LVEF 50-55%, mid anterior septal hypokinesis, grade 1 diastolic dysfunction, mild MR. Previously required down titration of Metoprolol due to bradycardia.   Was last seen in clinic by Gillian Shields, NP where she was doing well from a CV standpoint.  Today, the patient overall feels well. No chest pain, SOB, lightheadedness, or dizziness. No LE edema, orthopnea or PND. Tolerating medications without issues. Has not had to use her nitro.   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   Past Medical History:  Diagnosis Date   CAD (coronary artery disease)    a. 01/2016 NSTEMI/Cath: LM 75, LAD 70ost, LCX 50ost, 70p, LPDA 70, OM1 90;  b. 02/10/2016 CABG x 3 (LIMA->LAD, VG->OM1, VG->OM2).   Ischemic cardiomyopathy    a. 02/02/2016 Echo: EF 35-40% w/ mid-apicalanteroseptal and apical AK, Gr1 DD-->high suspicion for takotsubo;  b. 02/16/2016 Echo (post-cabg): EF 50-55%, midanteroseptal HK, Gr1 DD, mild MR, PASP , triv effusion.   Osteoarthritis    PVC's (premature ventricular contractions)    a. 02/2016 Freq pvc's noted post-op.    Past Surgical History:  Procedure Laterality Date   CARDIAC CATHETERIZATION N/A 02/01/2016   Procedure: Left Heart Cath and Coronary Angiography;   Surgeon: Lennette Bihari, MD;  Location: [facility] INVASIVE CV LAB;  Service: Cardiovascular;  Laterality: N/A;   CORONARY ARTERY BYPASS GRAFT N/A 02/10/2016   Procedure: CORONARY ARTERY BYPASS GRAFTING (CABG)x 3 with endoscopic harvesting of right saphenous vein;  Surgeon: [name], MD;  Location: [facility] OR;  Service: Open Heart Surgery;  Laterality: N/A;   TEE WITHOUT CARDIOVERSION N/A 02/10/2016   Procedure: TRANSESOPHAGEAL ECHOCARDIOGRAM (TEE);  Surgeon: [name], MD;  Location: [facility] OR;  Service: Open Heart Surgery;  Laterality: N/A;    Current Medications: Current Meds  Medication Sig   aspirin EC 81 MG EC tablet Take 1 tablet (81 mg total) by mouth daily.   celecoxib (CELEBREX) 200 MG capsule Take 200 mg by mouth daily.   cholecalciferol (VITAMIN D) 1000 units tablet Take 1,000 Units by mouth daily.    estradiol (ESTRACE) 0.5 MG tablet Take 0.5 mg by mouth daily.   lisinopril (ZESTRIL) 2.5 MG tablet Take 1 tablet (2.5 mg total) by mouth daily.   progesterone (PROMETRIUM) 100 MG capsule Take 100 mg by mouth daily.   ranitidine (ZANTAC) 150 MG tablet Take 150 mg by mouth daily as needed for heartburn.   rosuvastatin (CRESTOR) 20 MG tablet Take 1 tablet (20 mg total) by mouth daily.     Allergies:   Aleve [naproxen sodium] and Codeine   Social History   Socioeconomic History   Marital status: Married    Spouse name: Not on file   Number of children: Not on file  Years of education: Not on file   Highest education level: Not on file  Occupational History   Not on file  Tobacco Use   Smoking status: Never   Smokeless tobacco: Never  Vaping Use   Vaping Use: Never used  Substance and Sexual Activity   Alcohol use: No   Drug use: No   Sexual activity: Not on file  Other Topics Concern   Not on file  Social History Narrative   Not on file   Social Determinants of Health   Financial Resource Strain: Not on file  Food Insecurity: Not on file  Transportation Needs: Not on  file  Physical Activity: Not on file  Stress: Not on file  Social Connections: Not on file     Family History: The patient's family history is not on file. She was adopted.  ROS:   Please see the history of present illness.     All other systems reviewed and are negative.  EKGs/Labs/Other Studies Reviewed:    The following studies were reviewed today: TTE Feb 12, 2016: - Left ventricle: The cavity size was normal. Wall thickness was    normal. Systolic function was normal. The estimated ejection    fraction was in the range of 50% to 55%. There is hypokinesis of    the midanteroseptal myocardium. Doppler parameters are consistent    with abnormal left ventricular relaxation (grade 1 diastolic    dysfunction).  - Mitral valve: There was mild regurgitation.  - Pulmonary arteries: Systolic pressure was mildly increased. PA    peak pressure: 33 mm Hg (S).  - Pericardium, extracardiac: A trivial pericardial effusion was    identified posterior to the heart. There was no evidence of    hemodynamic compromise.   EKG:  No new tracing today  Recent Labs: No results found for requested labs within last 365 days.  Recent Lipid Panel    Component Value Date/Time   CHOL 124 02/25/2020 1016   TRIG 146 02/25/2020 1016   HDL 43 02/25/2020 1016   CHOLHDL 2.9 02/25/2020 1016   CHOLHDL 3.7 04/03/2016 1104   VLDL 20 04/03/2016 1104   LDLCALC 56 02/25/2020 1016     Risk Assessment/Calculations:           Physical Exam:    VS:  BP 130/70   Pulse 75   Ht 5\' 1"  (1.549 m)   Wt 103 lb 6.4 oz (46.9 kg)   SpO2 94%   BMI 19.54 kg/m     Wt Readings from Last 3 Encounters:  12/09/21 103 lb 6.4 oz (46.9 kg)  04/25/21 104 lb (47.2 kg)  08/26/20 101 lb 6.4 oz (46 kg)     GEN:  Well nourished, well developed in no acute distress HEENT: Normal NECK: No JVD; No carotid bruits CARDIAC: RRR, no murmurs, rubs, gallops RESPIRATORY:  Clear to auscultation without rales, wheezing or rhonchi   ABDOMEN: Soft, non-tender, non-distended MUSCULOSKELETAL:  No edema; No deformity  SKIN: Warm and dry NEUROLOGIC:  Alert and oriented x 3 PSYCHIATRIC:  Normal affect   ASSESSMENT:    1. Coronary artery disease involving coronary bypass graft of native heart without angina pectoris   2. S/P CABG x 3   3. Essential hypertension   4. Hyperlipidemia, unspecified hyperlipidemia type    PLAN:    In order of problems listed above:  #CAD s/p CABG x3 in 2017: Patient has history of NSTEMI in 2017 with revealing severe multivessel CAD. She is now CABGx3 (LIMA-LAD,  SVG-OM1, SVG-OM2) with Dr. Alla German. EF prior to surgery 35-40% that improved to 50-55% following revascularization. Currently doing well without anginal symptoms.  -Continue ASA 81mg  daily -Continue crestor 20mg  daily -Continue lisinopril 2.5mg  daily  #HTN: Well controlled. -Continue lisinopril 2.5mg  daily  #HLD: -Continue crestor 20mg  daily -LDL 58 in 07/2020 (followed by PCP)          Medication Adjustments/Labs and Tests Ordered: Current medicines are reviewed at length with the patient today.  Concerns regarding medicines are outlined above.  No orders of the defined types were placed in this encounter.  No orders of the defined types were placed in this encounter.   Patient Instructions  Medication Instructions:  The current medical regimen is effective;  continue present plan and medications.  *If you need a refill on your cardiac medications before your next appointment, please call your pharmacy*   Follow-Up: At [facility], you and your health needs are our priority.  As part of our continuing mission to provide you with exceptional heart care, we have created designated Provider Care Teams.  These Care Teams include your primary Cardiologist (physician) and Advanced Practice Providers (APPs -  Physician Assistants and Nurse Practitioners) who all work together to provide you with the care you need,  when you need it.  We recommend signing up for the patient portal called "MyChart".  Sign up information is provided on this After Visit Summary.  MyChart is used to connect with patients for Virtual Visits (Telemedicine).  Patients are able to view lab/test results, encounter notes, upcoming appointments, etc.  Non-urgent messages can be sent to your provider as well.   To learn more about what you can do with MyChart, go to .    Your next appointment:   6 month(s)  The format for your next appointment:   In Person  Provider:   08/2020, MD {   Important Information About Sugar         Signed, [facility], MD  12/09/2021 11:35 AM    [facility]

## 2021-12-09 ENCOUNTER — Encounter: Payer: Self-pay | Admitting: Cardiology

## 2021-12-09 ENCOUNTER — Ambulatory Visit (INDEPENDENT_AMBULATORY_CARE_PROVIDER_SITE_OTHER): Payer: PPO | Admitting: Cardiology

## 2021-12-09 VITALS — BP 130/70 | HR 75 | Ht 61.0 in | Wt 103.4 lb

## 2021-12-09 DIAGNOSIS — Z951 Presence of aortocoronary bypass graft: Secondary | ICD-10-CM | POA: Diagnosis not present

## 2021-12-09 DIAGNOSIS — I25810 Atherosclerosis of coronary artery bypass graft(s) without angina pectoris: Secondary | ICD-10-CM

## 2021-12-09 DIAGNOSIS — E785 Hyperlipidemia, unspecified: Secondary | ICD-10-CM | POA: Diagnosis not present

## 2021-12-09 DIAGNOSIS — I10 Essential (primary) hypertension: Secondary | ICD-10-CM | POA: Diagnosis not present

## 2021-12-09 NOTE — Patient Instructions (Signed)
Medication Instructions:  The current medical regimen is effective;  continue present plan and medications.  *If you need a refill on your cardiac medications before your next appointment, please call your pharmacy*  Follow-Up: At [facility], you and your health needs are our priority.  As part of our continuing mission to provide you with exceptional heart care, we have created designated Provider Care Teams.  These Care Teams include your primary Cardiologist (physician) and Advanced Practice Providers (APPs -  Physician Assistants and Nurse Practitioners) who all work together to provide you with the care you need, when you need it.  We recommend signing up for the patient portal called "MyChart".  Sign up information is provided on this After Visit Summary.  MyChart is used to connect with patients for Virtual Visits (Telemedicine).  Patients are able to view lab/test results, encounter notes, upcoming appointments, etc.  Non-urgent messages can be sent to your provider as well.   To learn more about what you can do with MyChart, go to [email or web address].com.    Your next appointment:   6 month(s)  The format for your next appointment:   In Person  Provider:   Heather E Pemberton, MD {   Important Information About Sugar       

## 2022-03-20 DIAGNOSIS — E782 Mixed hyperlipidemia: Secondary | ICD-10-CM | POA: Diagnosis not present

## 2022-03-20 DIAGNOSIS — Z79899 Other long term (current) drug therapy: Secondary | ICD-10-CM | POA: Diagnosis not present

## 2022-03-20 DIAGNOSIS — E559 Vitamin D deficiency, unspecified: Secondary | ICD-10-CM | POA: Diagnosis not present

## 2022-03-20 DIAGNOSIS — E538 Deficiency of other specified B group vitamins: Secondary | ICD-10-CM | POA: Diagnosis not present

## 2022-04-06 DIAGNOSIS — Z01419 Encounter for gynecological examination (general) (routine) without abnormal findings: Secondary | ICD-10-CM | POA: Diagnosis not present

## 2022-04-06 DIAGNOSIS — M85851 Other specified disorders of bone density and structure, right thigh: Secondary | ICD-10-CM | POA: Diagnosis not present

## 2022-04-06 DIAGNOSIS — M25552 Pain in left hip: Secondary | ICD-10-CM | POA: Diagnosis not present

## 2022-04-06 DIAGNOSIS — Z23 Encounter for immunization: Secondary | ICD-10-CM | POA: Diagnosis not present

## 2022-04-06 DIAGNOSIS — Z1331 Encounter for screening for depression: Secondary | ICD-10-CM | POA: Diagnosis not present

## 2022-04-06 DIAGNOSIS — Z0000 Encounter for general adult medical examination without abnormal findings: Secondary | ICD-10-CM | POA: Diagnosis not present

## 2022-04-06 DIAGNOSIS — N951 Menopausal and female climacteric states: Secondary | ICD-10-CM | POA: Diagnosis not present

## 2022-04-06 DIAGNOSIS — K219 Gastro-esophageal reflux disease without esophagitis: Secondary | ICD-10-CM | POA: Diagnosis not present

## 2022-04-06 DIAGNOSIS — R7309 Other abnormal glucose: Secondary | ICD-10-CM | POA: Diagnosis not present

## 2022-04-06 DIAGNOSIS — M545 Low back pain, unspecified: Secondary | ICD-10-CM | POA: Diagnosis not present

## 2022-04-06 DIAGNOSIS — I2510 Atherosclerotic heart disease of native coronary artery without angina pectoris: Secondary | ICD-10-CM | POA: Diagnosis not present

## 2022-04-06 DIAGNOSIS — I10 Essential (primary) hypertension: Secondary | ICD-10-CM | POA: Diagnosis not present

## 2022-04-10 ENCOUNTER — Other Ambulatory Visit: Payer: Self-pay | Admitting: Family Medicine

## 2022-04-10 DIAGNOSIS — M85851 Other specified disorders of bone density and structure, right thigh: Secondary | ICD-10-CM

## 2022-04-13 ENCOUNTER — Other Ambulatory Visit: Payer: Self-pay | Admitting: Family Medicine

## 2022-04-13 DIAGNOSIS — Z1231 Encounter for screening mammogram for malignant neoplasm of breast: Secondary | ICD-10-CM

## 2022-04-18 NOTE — Therapy (Signed)
OUTPATIENT PHYSICAL THERAPY LOWER EXTREMITY EVALUATION   Patient Name: Carolyn Burns MRN: [id number] DOB:02/16/1941, 81 y.o., female Today's Date: 04/19/2022   PT End of Session - 04/19/22 1045     Visit Number 1    Date for PT Re-Evaluation 06/01/22    Authorization Type [employer]    Authorization Time Period 04/19/22 to 06/01/22    Progress Note Due on Visit 10    PT Start Time 0930    PT Stop Time 1015    PT Time Calculation (min) 45 min    Activity Tolerance Patient tolerated treatment well;No increased pain    Behavior During Therapy WFL for tasks assessed/performed             Past Medical History:  Diagnosis Date   CAD (coronary artery disease)    a. 01/2016 NSTEMI/Cath: LM 75, LAD 70ost, LCX 50ost, 70p, LPDA 70, OM1 90;  b. 02/10/2016 CABG x 3 (LIMA->LAD, VG->OM1, VG->OM2).   Ischemic cardiomyopathy    a. 02/02/2016 Echo: EF 35-40% w/ mid-apicalanteroseptal and apical AK, Gr1 DD-->high suspicion for takotsubo;  b. 02/16/2016 Echo (post-cabg): EF 50-55%, midanteroseptal HK, Gr1 DD, mild MR, PASP , triv effusion.   Osteoarthritis    PVC's (premature ventricular contractions)    a. 02/2016 Freq pvc's noted post-op.   Past Surgical History:  Procedure Laterality Date   CARDIAC CATHETERIZATION N/A 02/01/2016   Procedure: Left Heart Cath and Coronary Angiography;  Surgeon: Lennette Bihari, MD;  Location: [facility] INVASIVE CV LAB;  Service: Cardiovascular;  Laterality: N/A;   CORONARY ARTERY BYPASS GRAFT N/A 02/10/2016   Procedure: CORONARY ARTERY BYPASS GRAFTING (CABG)x 3 with endoscopic harvesting of right saphenous vein;  Surgeon: [name], MD;  Location: [facility] OR;  Service: Open Heart Surgery;  Laterality: N/A;   TEE WITHOUT CARDIOVERSION N/A 02/10/2016   Procedure: TRANSESOPHAGEAL ECHOCARDIOGRAM (TEE);  Surgeon: [name], MD;  Location: [facility] OR;  Service: Open Heart Surgery;  Laterality: N/A;   Patient Active Problem List   Diagnosis Date Noted    CAD (coronary artery disease) 02/08/2017   Ischemic cardiomyopathy 02/08/2017   PVC's (premature ventricular contractions) 01/15/2017   Sinus bradycardia 01/15/2017   Hyperlipidemia 07/27/2016   Malnutrition of moderate degree 02/16/2016   Osteoarthritis 02/11/2016   GERD (gastroesophageal reflux disease) 02/11/2016   NSTEMI (non-ST elevated myocardial infarction) (HCC) 01/31/2016    PCP: Gweneth Dimitri, MD  REFERRING PROVIDER: Gweneth Dimitri, MD  REFERRING DIAG: Pain in Lt hip   THERAPY DIAG:  Pain in left hip  Other low back pain  Muscle weakness (generalized)  Unsteadiness on feet  Rationale for Evaluation and Treatment: Rehabilitation  ONSET DATE: couple of months  SUBJECTIVE:   SUBJECTIVE STATEMENT: Pt states that she was referred for Lt hip pain, but she has not had pain for the past 2 days. She would like to discuss a problem with her legs tingling/going numb when standing or walking for any period of time.    PERTINENT HISTORY: Osteopenia, arthritis, low back pain in 2008 was going to have surgery at the time PAIN:  Are you having pain? No  PRECAUTIONS: None and Other: Pt reports having osteopenia  WEIGHT BEARING RESTRICTIONS: No  FALLS:  Has patient fallen in last 6 months? Yes. 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  No pain reported with this but patient noted that her back felt "better" following  LOWER EXTREMITY ROM:  A/ ROM Right eval Left eval  Hip flexion    Hip extension    Hip abduction    Hip adduction    Hip internal rotation    Hip external rotation    Knee flexion    Knee extension    Ankle dorsiflexion    Ankle plantarflexion    Ankle inversion    Ankle eversion     (Blank rows = not tested)  LOWER EXTREMITY MMT:  MMT Right eval Left eval  Hip flexion 5 5  Hip extension 3 3  Hip abduction 3 3  Hip adduction    Hip internal rotation    Hip external rotation    Knee flexion    Knee extension 5 5  Ankle dorsiflexion    Ankle plantarflexion <10 heel raises <10 heel raises  Ankle inversion    Ankle eversion     (Blank rows = not tested  Lumbar AROM: flexion limited 50%, rotation right/left 25% limited pain-free all AROM  LOWER EXTREMITY SPECIAL TESTS:  Hip special tests: Luisa Hart (FABER) test: negative and Hip scouring test: negative  FUNCTIONAL TESTS:    GAIT: Distance walked: 21ft Assistive device utilized: None Level of assistance: Complete Independence Comments: (+) Trendelenburg, decreased trunk rotation and arm swing   TODAY'S TREATMENT:                                                                                                                              DATE:  04/19/22 Bridge x10 reps Standing hip abduction toe tap 5 x 5-second hold each side Positive left posterior thigh pain with hip abduction, this resolved with hip extension instead   PATIENT EDUCATION:  Education details: eval findings/POC; implemented HEP; benefits of improving lumbar/hip flexibility and strength  Person educated: Patient Education method: Explanation and Handouts Education comprehension: verbalized understanding and returned  demonstration  HOME EXERCISE PROGRAM: Access Code: [email or web address] URL: [email or web address] Date: 04/19/2022 Prepared by: [facility]  Exercises - Beginner Bridge  - 1 x daily - 7 x weekly - 2 sets - 10 reps - Standing Hip Abduction with Counter Support  - 1 x daily - 7 x weekly - 2 sets - 5 reps - Standing Hip Extension with  Counter Support  - 1 x daily - 7 x weekly - 2 sets - 5 reps  ASSESSMENT:  CLINICAL IMPRESSION: Patient is a 81 y.o. F who was seen today for physical therapy evaluation and treatment for recent onset of left buttock/hip pain.  At today's evaluation she was not having any pain as of 2 days ago.  She also voiced concerns of BLE tingling with standing/walking activity.  This has been going on for a couple of months.  Patient denies numbness. Her LE symptoms and Lt hip pain were not produced with repeated lumbar extension or active flexion. Pt was minimally tender throughout the Lt buttock/glute region, but there was significant restrictions with passive lumbar segment testing. She has significant LE weakness, and limited hip extension which impacts her standing posture and stability with ambulation. She moved in with her daughter a little over a year ago and has not needed to remain as active as she typical would. She would benefit from skilled PT to address limitations in LE strength/flexibility, lumbar spine mobility and improve posture with daily activity.  OBJECTIVE IMPAIRMENTS: Abnormal gait, decreased balance, decreased endurance, decreased mobility, decreased ROM, decreased strength, increased muscle spasms, impaired flexibility, postural dysfunction, and pain.   ACTIVITY LIMITATIONS: lifting, bending, standing, squatting, sleeping, and locomotion level  PARTICIPATION LIMITATIONS: community activity and yard work  PERSONAL FACTORS: Age, Fitness, Past/current experiences, and 1-2 comorbidities: osteopenia,  osteoarthritis  are also affecting patient's functional outcome.   REHAB POTENTIAL: Good  CLINICAL DECISION MAKING: Evolving/moderate complexity  EVALUATION COMPLEXITY: Moderate   GOALS: Goals reviewed with patient? Yes  SHORT TERM GOALS: Target date: 05/03/2022  Patient will be independent with her HEP and walking program to improve strength and stamina Baseline: Goal status: INITIAL    LONG TERM GOALS: Target date: 05/31/2022   Patient will have LE strength at least 4/5 MMT Baseline:  Goal status: INITIAL  2.  Patient will report no LOB or near falls from start of PT Baseline:  Goal status: INITIAL  3.  Patient will report being able to sleep through the night without left hip pain for atleast 1 week. Baseline:  Goal status: INITIAL  4.  Pt will have improved single leg strength evident by her ability to maintain SLS up to 3 sec for 2/3 trials. Baseline:  Goal status: INITIAL  5.  Pt will report 40% improvement in her LE symptoms while standing/walking Baseline:  Goal status: INITIAL  6.  Pt will be independent with a walking program and her advanced HEP to maintain her fitness and decrease the need for future surgical intervention. Baseline:  Goal status: INITIAL   PLAN:  PT FREQUENCY: 1x/week  PT DURATION: 6 weeks  PLANNED INTERVENTIONS: Therapeutic exercises, Therapeutic activity, Neuromuscular re-education, Balance training, Gait training, Patient/Family education, Self Care, Joint mobilization, Aquatic Therapy, and Re-evaluation  PLAN FOR NEXT SESSION: gentle lumbar mobs; progress hip ext/abd strength; some balance work if time allows   10:47 AM,04/19/22 Donita Brooks PT, DPT [facility] [place]  [phone]

## 2022-04-19 ENCOUNTER — Other Ambulatory Visit: Payer: Self-pay

## 2022-04-19 ENCOUNTER — Ambulatory Visit: Payer: PPO | Attending: Family Medicine | Admitting: Physical Therapy

## 2022-04-19 ENCOUNTER — Encounter: Payer: Self-pay | Admitting: Physical Therapy

## 2022-04-19 DIAGNOSIS — M25552 Pain in left hip: Secondary | ICD-10-CM | POA: Diagnosis not present

## 2022-04-19 DIAGNOSIS — R2681 Unsteadiness on feet: Secondary | ICD-10-CM | POA: Diagnosis not present

## 2022-04-19 DIAGNOSIS — M6281 Muscle weakness (generalized): Secondary | ICD-10-CM | POA: Insufficient documentation

## 2022-04-19 DIAGNOSIS — M5459 Other low back pain: Secondary | ICD-10-CM | POA: Diagnosis not present

## 2022-04-26 ENCOUNTER — Ambulatory Visit: Payer: PPO

## 2022-05-03 ENCOUNTER — Ambulatory Visit: Payer: PPO | Admitting: Physical Therapy

## 2022-05-05 ENCOUNTER — Other Ambulatory Visit (HOSPITAL_BASED_OUTPATIENT_CLINIC_OR_DEPARTMENT_OTHER): Payer: Self-pay | Admitting: Family

## 2022-05-05 DIAGNOSIS — I10 Essential (primary) hypertension: Secondary | ICD-10-CM

## 2022-05-05 DIAGNOSIS — E785 Hyperlipidemia, unspecified: Secondary | ICD-10-CM

## 2022-05-05 DIAGNOSIS — I25810 Atherosclerosis of coronary artery bypass graft(s) without angina pectoris: Secondary | ICD-10-CM

## 2022-05-05 NOTE — Telephone Encounter (Signed)
Patient of Dr. Pemberton. Please review for refill. Thank you!  

## 2022-05-10 ENCOUNTER — Ambulatory Visit: Payer: PPO | Admitting: Physical Therapy

## 2022-05-10 NOTE — Therapy (Incomplete)
OUTPATIENT PHYSICAL THERAPY LOWER EXTREMITY EVALUATION   Patient Name: Carolyn Burns MRN: [id number] DOB:September 17, 1940, 81 y.o., female Today's Date: 05/10/2022     Past Medical History:  Diagnosis Date   CAD (coronary artery disease)    a. 01/2016 NSTEMI/Cath: LM 75, LAD 70ost, LCX 50ost, 70p, LPDA 70, OM1 90;  b. 02/10/2016 CABG x 3 (LIMA->LAD, VG->OM1, VG->OM2).   Ischemic cardiomyopathy    a. 02/02/2016 Echo: EF 35-40% w/ mid-apicalanteroseptal and apical AK, Gr1 DD-->high suspicion for takotsubo;  b. 02/16/2016 Echo (post-cabg): EF 50-55%, midanteroseptal HK, Gr1 DD, mild MR, PASP , triv effusion.   Osteoarthritis    PVC's (premature ventricular contractions)    a. 02/2016 Freq pvc's noted post-op.   Past Surgical History:  Procedure Laterality Date   CARDIAC CATHETERIZATION N/A 02/01/2016   Procedure: Left Heart Cath and Coronary Angiography;  Surgeon: Lennette Bihari, MD;  Location: [facility] INVASIVE CV LAB;  Service: Cardiovascular;  Laterality: N/A;   CORONARY ARTERY BYPASS GRAFT N/A 02/10/2016   Procedure: CORONARY ARTERY BYPASS GRAFTING (CABG)x 3 with endoscopic harvesting of right saphenous vein;  Surgeon: [name], MD;  Location: [facility] OR;  Service: Open Heart Surgery;  Laterality: N/A;   TEE WITHOUT CARDIOVERSION N/A 02/10/2016   Procedure: TRANSESOPHAGEAL ECHOCARDIOGRAM (TEE);  Surgeon: [name], MD;  Location: [facility] OR;  Service: Open Heart Surgery;  Laterality: N/A;   Patient Active Problem List   Diagnosis Date Noted   CAD (coronary artery disease) 02/08/2017   Ischemic cardiomyopathy 02/08/2017   PVC's (premature ventricular contractions) 01/15/2017   Sinus bradycardia 01/15/2017   Hyperlipidemia 07/27/2016   Malnutrition of moderate degree 02/16/2016   Osteoarthritis 02/11/2016   GERD (gastroesophageal reflux disease) 02/11/2016   NSTEMI (non-ST elevated myocardial infarction) (HCC) 01/31/2016    PCP: Gweneth Dimitri, MD  REFERRING PROVIDER: Gweneth Dimitri,  MD  REFERRING DIAG: Pain in Lt hip   THERAPY DIAG:  No diagnosis found.  Rationale for Evaluation and Treatment: Rehabilitation  ONSET DATE: couple of months  SUBJECTIVE:   SUBJECTIVE STATEMENT: Pt states that she was referred for Lt hip pain, but she has not had pain for the past 2 days. She would like to discuss a problem with her legs tingling/going numb when standing or walking for any period of time.    PERTINENT HISTORY: Osteopenia, arthritis, low back pain in 2008 was going to have surgery at the time PAIN:  Are you having pain? No  PRECAUTIONS: None and Other: Pt reports having osteopenia  WEIGHT BEARING RESTRICTIONS: No  FALLS:  Has patient fallen in last 6 months? Yes. 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  No pain reported with this but patient noted that her back felt "better" following  LOWER  EXTREMITY ROM:  A/ ROM Right eval Left eval  Hip flexion    Hip extension    Hip abduction    Hip adduction    Hip internal rotation    Hip external rotation    Knee flexion    Knee extension    Ankle dorsiflexion    Ankle plantarflexion    Ankle inversion    Ankle eversion     (Blank  rows = not tested)  LOWER EXTREMITY MMT:  MMT Right eval Left eval  Hip flexion 5 5  Hip extension 3 3  Hip abduction 3 3  Hip adduction    Hip internal rotation    Hip external rotation    Knee flexion    Knee extension 5 5  Ankle dorsiflexion    Ankle plantarflexion <10 heel raises <10 heel raises  Ankle inversion    Ankle eversion     (Blank rows = not tested  Lumbar AROM: flexion limited 50%, rotation right/left 25% limited pain-free all AROM  LOWER EXTREMITY SPECIAL TESTS:  Hip special tests: Luisa Hart (FABER) test: negative and Hip scouring test: negative  FUNCTIONAL TESTS:    GAIT: Distance walked: 21ft Assistive device utilized: None Level of assistance: Complete Independence Comments: (+) Trendelenburg, decreased trunk rotation and arm swing   TODAY'S TREATMENT:                                                                                                                              DATE:  04/19/22 Bridge x10 reps Standing hip abduction toe tap 5 x 5-second hold each side Positive left posterior thigh pain with hip abduction, this resolved with hip extension instead   PATIENT EDUCATION:  Education details: eval findings/POC; implemented HEP; benefits of improving lumbar/hip flexibility and strength  Person educated: Patient Education method: Explanation and Handouts Education comprehension: verbalized understanding and returned demonstration  HOME EXERCISE PROGRAM: Access Code: [email or web address] URL: [email or web address] Date: 04/19/2022 Prepared by: [facility]  Exercises - Beginner Bridge  - 1 x daily - 7 x weekly - 2 sets - 10 reps - Standing Hip Abduction with Counter Support  - 1 x daily - 7 x weekly - 2 sets - 5 reps - Standing Hip Extension with Counter Support  - 1 x daily - 7 x weekly - 2 sets - 5 reps  ASSESSMENT:  CLINICAL IMPRESSION: Patient is a 81 y.o. F who was seen today for  physical therapy evaluation and treatment for recent onset of left buttock/hip pain.  At today's evaluation she was not having any pain as of 2 days ago.  She also voiced concerns of BLE tingling with standing/walking activity.  This has been going on for a couple of months.  Patient denies numbness. Her LE symptoms and Lt hip pain were not produced with repeated lumbar extension or active flexion. Pt was minimally tender throughout the Lt buttock/glute region, but there was  significant restrictions with passive lumbar segment testing. She has significant LE weakness, and limited hip extension which impacts her standing posture and stability with ambulation. She moved in with her daughter a little over a year ago and has not needed to remain as active as she typical would. She would benefit from skilled PT to address limitations in LE strength/flexibility, lumbar spine mobility and improve posture with daily activity.  OBJECTIVE IMPAIRMENTS: Abnormal gait, decreased balance, decreased endurance, decreased mobility, decreased ROM, decreased strength, increased muscle spasms, impaired flexibility, postural dysfunction, and pain.   ACTIVITY LIMITATIONS: lifting, bending, standing, squatting, sleeping, and locomotion level  PARTICIPATION LIMITATIONS: community activity and yard work  PERSONAL FACTORS: Age, Fitness, Past/current experiences, and 1-2 comorbidities: osteopenia, osteoarthritis  are also affecting patient's functional outcome.   REHAB POTENTIAL: Good  CLINICAL DECISION MAKING: Evolving/moderate complexity  EVALUATION COMPLEXITY: Moderate   GOALS: Goals reviewed with patient? Yes  SHORT TERM GOALS: Target date: 05/24/2022  Patient will be independent with her HEP and walking program to improve strength and stamina Baseline: Goal status: INITIAL    LONG TERM GOALS: Target date: 06/21/2022   Patient will have LE strength at least 4/5 MMT Baseline:  Goal status: INITIAL  2.   Patient will report no LOB or near falls from start of PT Baseline:  Goal status: INITIAL  3.  Patient will report being able to sleep through the night without left hip pain for atleast 1 week. Baseline:  Goal status: INITIAL  4.  Pt will have improved single leg strength evident by her ability to maintain SLS up to 3 sec for 2/3 trials. Baseline:  Goal status: INITIAL  5.  Pt will report 40% improvement in her LE symptoms while standing/walking Baseline:  Goal status: INITIAL  6.  Pt will be independent with a walking program and her advanced HEP to maintain her fitness and decrease the need for future surgical intervention. Baseline:  Goal status: INITIAL   PLAN:  PT FREQUENCY: 1x/week  PT DURATION: 6 weeks  PLANNED INTERVENTIONS: Therapeutic exercises, Therapeutic activity, Neuromuscular re-education, Balance training, Gait training, Patient/Family education, Self Care, Joint mobilization, Aquatic Therapy, and Re-evaluation  PLAN FOR NEXT SESSION: gentle lumbar mobs; progress hip ext/abd strength; some balance work if time allows   6:20 AM,05/10/22 Donita Brooks PT, DPT [facility] [place]  [phone]

## 2022-05-16 NOTE — Therapy (Signed)
OUTPATIENT PHYSICAL THERAPY LOWER EXTREMITY EVALUATION   Patient Name: Carolyn Burns MRN: [id number] DOB:[birth year], 81 y.o., female Today's Date: 05/17/2022   PT End of Session - 05/17/22 0853     Visit Number 2    Date for PT Re-Evaluation 06/01/22    Authorization Type [employer]    Authorization Time Period 04/19/22 to 06/01/22    Progress Note Due on Visit 10    PT Start Time 0853    PT Stop Time 0908    PT Time Calculation (min) 15 min    Activity Tolerance Patient tolerated treatment well;No increased pain    Behavior During Therapy WFL for tasks assessed/performed              Past Medical History:  Diagnosis Date   CAD (coronary artery disease)    a. 01/2016 NSTEMI/Cath: LM 75, LAD 70ost, LCX 50ost, 70p, LPDA 70, OM1 90;  b. 02/10/2016 CABG x 3 (LIMA->LAD, VG->OM1, VG->OM2).   Ischemic cardiomyopathy    a. 02/02/2016 Echo: EF 35-40% w/ mid-apicalanteroseptal and apical AK, Gr1 DD-->high suspicion for takotsubo;  b. 02/16/2016 Echo (post-cabg): EF 50-55%, midanteroseptal HK, Gr1 DD, mild MR, PASP , triv effusion.   Osteoarthritis    PVC's (premature ventricular contractions)    a. 02/2016 Freq pvc's noted post-op.   Past Surgical History:  Procedure Laterality Date   CARDIAC CATHETERIZATION N/A 02/01/2016   Procedure: Left Heart Cath and Coronary Angiography;  Surgeon: Lennette Bihari, MD;  Location: [facility] INVASIVE CV LAB;  Service: Cardiovascular;  Laterality: N/A;   CORONARY ARTERY BYPASS GRAFT N/A 02/10/2016   Procedure: CORONARY ARTERY BYPASS GRAFTING (CABG)x 3 with endoscopic harvesting of right saphenous vein;  Surgeon: [name], MD;  Location: [facility] OR;  Service: Open Heart Surgery;  Laterality: N/A;   TEE WITHOUT CARDIOVERSION N/A 02/10/2016   Procedure: TRANSESOPHAGEAL ECHOCARDIOGRAM (TEE);  Surgeon: [name], MD;  Location: [facility] OR;  Service: Open Heart Surgery;  Laterality: N/A;   Patient Active Problem List   Diagnosis Date Noted    CAD (coronary artery disease) 02/08/2017   Ischemic cardiomyopathy 02/08/2017   PVC's (premature ventricular contractions) 01/15/2017   Sinus bradycardia 01/15/2017   Hyperlipidemia 07/27/2016   Malnutrition of moderate degree 02/16/2016   Osteoarthritis 02/11/2016   GERD (gastroesophageal reflux disease) 02/11/2016   NSTEMI (non-ST elevated myocardial infarction) (HCC) 01/31/2016    PCP: Gweneth Dimitri, MD  REFERRING PROVIDER: Gweneth Dimitri, MD  REFERRING DIAG: Pain in Lt hip   THERAPY DIAG:  Pain in left hip  Other low back pain  Muscle weakness (generalized)  Unsteadiness on feet  Rationale for Evaluation and Treatment: Rehabilitation  ONSET DATE: couple of months  SUBJECTIVE:   SUBJECTIVE STATEMENT: My arthritis is kicking my butt. In my legs they just go numb, at first it was in my left leg and it would go away when I sit down. Yesterday I was shopping and both my legs went out and I had to sit on the floor. I do not feel like doing anything today.   PERTINENT HISTORY: Osteopenia, arthritis, low back pain in 2008 was going to have surgery at the time PAIN:  Are you having pain? No  PRECAUTIONS: None and Other: Pt reports having osteopenia  WEIGHT BEARING RESTRICTIONS: No  FALLS:  Has patient fallen in last 6 months? Yes. 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  No pain reported with this but patient noted that her back felt "better" following  LOWER EXTREMITY ROM:  A/ ROM Right eval Left eval  Hip flexion    Hip extension    Hip abduction    Hip adduction    Hip internal rotation    Hip external rotation    Knee flexion    Knee extension    Ankle dorsiflexion    Ankle plantarflexion    Ankle inversion    Ankle eversion     (Blank rows = not tested)  LOWER EXTREMITY MMT:  MMT Right eval Left eval  Hip flexion 5 5  Hip extension 3 3  Hip abduction 3 3  Hip adduction    Hip internal rotation    Hip external rotation    Knee flexion    Knee extension 5 5  Ankle dorsiflexion    Ankle plantarflexion <10 heel raises <10 heel raises  Ankle inversion    Ankle eversion     (Blank rows = not tested  Lumbar AROM: flexion limited 50%, rotation right/left 25% limited pain-free all AROM  LOWER EXTREMITY SPECIAL TESTS:  Hip special tests: Luisa Hart (FABER) test: negative and Hip scouring test: negative  FUNCTIONAL TESTS:    GAIT: Distance walked: 20ft Assistive device utilized: None Level of assistance: Complete Independence Comments: (+) Trendelenburg, decreased trunk rotation and arm swing   TODAY'S TREATMENT:                                                                                                                              DATE:  05/17/22- education about nerve pain and numbness and next steps   04/19/22 Bridge x10 reps Standing hip abduction toe tap 5 x 5-second hold each side Positive left posterior thigh pain with hip abduction, this resolved with hip extension instead   PATIENT EDUCATION:  Education details: eval findings/POC; implemented HEP; benefits of improving lumbar/hip flexibility and strength  Person educated:  Patient Education method: Explanation and Handouts Education comprehension: verbalized understanding and returned demonstration  HOME EXERCISE PROGRAM: Access Code: [email or web address] URL: [email or web address] Date: 04/19/2022 Prepared by: [facility] - Outpatient Rehab - [facility]  Exercises - Beginner Bridge  - 1 x daily - 7 x weekly - 2 sets - 10 reps -  Standing Hip Abduction with Counter Support  - 1 x daily - 7 x weekly - 2 sets - 5 reps - Standing Hip Extension with Counter Support  - 1 x daily - 7 x weekly - 2 sets - 5 reps  ASSESSMENT:  CLINICAL IMPRESSION: Patient returns not doing well, she states that yesterday her legs went numb while shopping and she had to sit on the floor and it scared her. States that the numbness only happens when standing or walking. Today she just wanted to talk about what might be going on and what we can do to help. I educated her about where the numbness could be coming from but I would not know exactly without possible imaging. She wanted a referral to a neurologist so I suggested she reach out to her PCP to do so. Also educated about them possibly putting her on Gabapentin. Tried to see if she wants to do just some gentle stretching and moving as that might help with the numbness but she kindly denied and would like to try again next visit.   OBJECTIVE IMPAIRMENTS: Abnormal gait, decreased balance, decreased endurance, decreased mobility, decreased ROM, decreased strength, increased muscle spasms, impaired flexibility, postural dysfunction, and pain.   ACTIVITY LIMITATIONS: lifting, bending, standing, squatting, sleeping, and locomotion level  PARTICIPATION LIMITATIONS: community activity and yard work  PERSONAL FACTORS: Age, Fitness, Past/current experiences, and 1-2 comorbidities: osteopenia, osteoarthritis  are also affecting patient's functional outcome.   REHAB POTENTIAL: Good  CLINICAL DECISION MAKING: Evolving/moderate  complexity  EVALUATION COMPLEXITY: Moderate   GOALS: Goals reviewed with patient? Yes  SHORT TERM GOALS: Target date: 05/31/2022  Patient will be independent with her HEP and walking program to improve strength and stamina Baseline: Goal status: INITIAL    LONG TERM GOALS: Target date: 06/28/2022   Patient will have LE strength at least 4/5 MMT Baseline:  Goal status: INITIAL  2.  Patient will report no LOB or near falls from start of PT Baseline:  Goal status: INITIAL  3.  Patient will report being able to sleep through the night without left hip pain for atleast 1 week. Baseline:  Goal status: INITIAL  4.  Pt will have improved single leg strength evident by her ability to maintain SLS up to 3 sec for 2/3 trials. Baseline:  Goal status: INITIAL  5.  Pt will report 40% improvement in her LE symptoms while standing/walking Baseline:  Goal status: INITIAL  6.  Pt will be independent with a walking program and her advanced HEP to maintain her fitness and decrease the need for future surgical intervention. Baseline:  Goal status: INITIAL   PLAN:  PT FREQUENCY: 1x/week  PT DURATION: 6 weeks  PLANNED INTERVENTIONS: Therapeutic exercises, Therapeutic activity, Neuromuscular re-education, Balance training, Gait training, Patient/Family education, Self Care, Joint mobilization, Aquatic Therapy, and Re-evaluation  PLAN FOR NEXT SESSION: gentle lumbar mobs; progress hip ext/abd strength; some balance work if time allows NuStep, Feet on pball, stretching, STS, Standing hip abd/ext, LAQ, HS curls, Walking on beam   9:10 AM,05/17/22 Cassie Freer PT, DPT [facility] [place]  [phone]

## 2022-05-17 ENCOUNTER — Ambulatory Visit: Payer: PPO | Attending: Family Medicine

## 2022-05-17 DIAGNOSIS — R2681 Unsteadiness on feet: Secondary | ICD-10-CM | POA: Insufficient documentation

## 2022-05-17 DIAGNOSIS — M5459 Other low back pain: Secondary | ICD-10-CM | POA: Diagnosis not present

## 2022-05-17 DIAGNOSIS — M25552 Pain in left hip: Secondary | ICD-10-CM | POA: Insufficient documentation

## 2022-05-17 DIAGNOSIS — M6281 Muscle weakness (generalized): Secondary | ICD-10-CM | POA: Diagnosis not present

## 2022-05-24 ENCOUNTER — Ambulatory Visit: Payer: PPO

## 2022-05-30 NOTE — Therapy (Incomplete)
OUTPATIENT PHYSICAL THERAPY LOWER EXTREMITY TREATMENT   Patient Name: Carolyn Burns MRN: [id number] DOB:06-04-1941, 81 y.o., female Today's Date: 05/17/2022   PT End of Session - 05/17/22 0853     Visit Number 2    Date for PT Re-Evaluation 06/01/22    Authorization Type [employer]    Authorization Time Period 04/19/22 to 06/01/22    Progress Note Due on Visit 10    PT Start Time 0853    PT Stop Time 0908    PT Time Calculation (min) 15 min    Activity Tolerance Patient tolerated treatment well;No increased pain    Behavior During Therapy WFL for tasks assessed/performed              Past Medical History:  Diagnosis Date   CAD (coronary artery disease)    a. 01/2016 NSTEMI/Cath: LM 75, LAD 70ost, LCX 50ost, 70p, LPDA 70, OM1 90;  b. 02/10/2016 CABG x 3 (LIMA->LAD, VG->OM1, VG->OM2).   Ischemic cardiomyopathy    a. 02/02/2016 Echo: EF 35-40% w/ mid-apicalanteroseptal and apical AK, Gr1 DD-->high suspicion for takotsubo;  b. 02/16/2016 Echo (post-cabg): EF 50-55%, midanteroseptal HK, Gr1 DD, mild MR, PASP , triv effusion.   Osteoarthritis    PVC's (premature ventricular contractions)    a. 02/2016 Freq pvc's noted post-op.   Past Surgical History:  Procedure Laterality Date   CARDIAC CATHETERIZATION N/A 02/01/2016   Procedure: Left Heart Cath and Coronary Angiography;  Surgeon: Lennette Bihari, MD;  Location: [facility] INVASIVE CV LAB;  Service: Cardiovascular;  Laterality: N/A;   CORONARY ARTERY BYPASS GRAFT N/A 02/10/2016   Procedure: CORONARY ARTERY BYPASS GRAFTING (CABG)x 3 with endoscopic harvesting of right saphenous vein;  Surgeon: [name], MD;  Location: [facility] OR;  Service: Open Heart Surgery;  Laterality: N/A;   TEE WITHOUT CARDIOVERSION N/A 02/10/2016   Procedure: TRANSESOPHAGEAL ECHOCARDIOGRAM (TEE);  Surgeon: [name], MD;  Location: [facility] OR;  Service: Open Heart Surgery;  Laterality: N/A;   Patient Active Problem List   Diagnosis Date Noted    CAD (coronary artery disease) 02/08/2017   Ischemic cardiomyopathy 02/08/2017   PVC's (premature ventricular contractions) 01/15/2017   Sinus bradycardia 01/15/2017   Hyperlipidemia 07/27/2016   Malnutrition of moderate degree 02/16/2016   Osteoarthritis 02/11/2016   GERD (gastroesophageal reflux disease) 02/11/2016   NSTEMI (non-ST elevated myocardial infarction) (HCC) 01/31/2016    PCP: Gweneth Dimitri, MD  REFERRING PROVIDER: Gweneth Dimitri, MD  REFERRING DIAG: Pain in Lt hip   THERAPY DIAG:  Pain in left hip  Other low back pain  Muscle weakness (generalized)  Unsteadiness on feet  Rationale for Evaluation and Treatment: Rehabilitation  ONSET DATE: couple of months  SUBJECTIVE:   SUBJECTIVE STATEMENT: My arthritis is kicking my butt. In my legs they just go numb, at first it was in my left leg and it would go away when I sit down. Yesterday I was shopping and both my legs went out and I had to sit on the floor. I do not feel like doing anything today.   PERTINENT HISTORY: Osteopenia, arthritis, low back pain in 2008 was going to have surgery at the time PAIN:  Are you having pain? No  PRECAUTIONS: None and Other: Pt reports having osteopenia  WEIGHT BEARING RESTRICTIONS: No  FALLS:  Has patient fallen in last 6 months? Yes. 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  No pain reported with this but patient noted that her back felt "better" following  LOWER EXTREMITY ROM:  A/ ROM Right eval Left eval  Hip flexion    Hip extension    Hip abduction    Hip adduction    Hip internal rotation    Hip external rotation    Knee flexion    Knee extension    Ankle dorsiflexion    Ankle plantarflexion    Ankle inversion    Ankle eversion     (Blank rows = not tested)  LOWER EXTREMITY MMT:  MMT Right eval Left eval  Hip flexion 5 5  Hip extension 3 3  Hip abduction 3 3  Hip adduction    Hip internal rotation    Hip external rotation    Knee flexion    Knee extension 5 5  Ankle dorsiflexion    Ankle plantarflexion <10 heel raises <10 heel raises  Ankle inversion    Ankle eversion     (Blank rows = not tested  Lumbar AROM: flexion limited 50%, rotation right/left 25% limited pain-free all AROM  LOWER EXTREMITY SPECIAL TESTS:  Hip special tests: Luisa Hart (FABER) test: negative and Hip scouring test: negative  FUNCTIONAL TESTS:    GAIT: Distance walked: 43ft Assistive device utilized: None Level of assistance: Complete Independence Comments: (+) Trendelenburg, decreased trunk rotation and arm swing   TODAY'S TREATMENT:                                                                                                                              DATE:  05/31/22 NuStep Stretching HS, SK2C Feet on pball trunk rotations, knees to chest, small bridges Bridges S2S   05/17/22- education about nerve pain and numbness and next steps   04/19/22 Bridge x10 reps Standing hip abduction toe tap 5 x 5-second hold each side Positive left posterior thigh pain with hip abduction, this resolved with hip extension instead   PATIENT EDUCATION:  Education details:  eval findings/POC; implemented HEP; benefits of improving lumbar/hip flexibility and strength  Person educated: Patient Education method: Explanation and Handouts Education comprehension: verbalized understanding and returned demonstration  HOME EXERCISE PROGRAM: Access Code: [email or web address] URL: [email or web address] Date: 04/19/2022 Prepared by: [facility] - Outpatient Rehab - [facility]  Exercises -  Beginner Bridge  - 1 x daily - 7 x weekly - 2 sets - 10 reps - Standing Hip Abduction with Counter Support  - 1 x daily - 7 x weekly - 2 sets - 5 reps - Standing Hip Extension with Counter Support  - 1 x daily - 7 x weekly - 2 sets - 5 reps  ASSESSMENT:  CLINICAL IMPRESSION: Patient returns not doing well, she states that yesterday her legs went numb while shopping and she had to sit on the floor and it scared her. States that the numbness only happens when standing or walking. Today she just wanted to talk about what might be going on and what we can do to help. I educated her about where the numbness could be coming from but I would not know exactly without possible imaging. She wanted a referral to a neurologist so I suggested she reach out to her PCP to do so. Also educated about them possibly putting her on Gabapentin. Tried to see if she wants to do just some gentle stretching and moving as that might help with the numbness but she kindly denied and would like to try again next visit.   OBJECTIVE IMPAIRMENTS: Abnormal gait, decreased balance, decreased endurance, decreased mobility, decreased ROM, decreased strength, increased muscle spasms, impaired flexibility, postural dysfunction, and pain.   ACTIVITY LIMITATIONS: lifting, bending, standing, squatting, sleeping, and locomotion level  PARTICIPATION LIMITATIONS: community activity and yard work  PERSONAL FACTORS: Age, Fitness, Past/current experiences, and 1-2 comorbidities: osteopenia, osteoarthritis  are also  affecting patient's functional outcome.   REHAB POTENTIAL: Good  CLINICAL DECISION MAKING: Evolving/moderate complexity  EVALUATION COMPLEXITY: Moderate   GOALS: Goals reviewed with patient? Yes  SHORT TERM GOALS: Target date: 05/31/2022  Patient will be independent with her HEP and walking program to improve strength and stamina Baseline: Goal status: INITIAL    LONG TERM GOALS: Target date: 06/28/2022   Patient will have LE strength at least 4/5 MMT Baseline:  Goal status: INITIAL  2.  Patient will report no LOB or near falls from start of PT Baseline:  Goal status: INITIAL  3.  Patient will report being able to sleep through the night without left hip pain for atleast 1 week. Baseline:  Goal status: INITIAL  4.  Pt will have improved single leg strength evident by her ability to maintain SLS up to 3 sec for 2/3 trials. Baseline:  Goal status: INITIAL  5.  Pt will report 40% improvement in her LE symptoms while standing/walking Baseline:  Goal status: INITIAL  6.  Pt will be independent with a walking program and her advanced HEP to maintain her fitness and decrease the need for future surgical intervention. Baseline:  Goal status: INITIAL   PLAN:  PT FREQUENCY: 1x/week  PT DURATION: 6 weeks  PLANNED INTERVENTIONS: Therapeutic exercises, Therapeutic activity, Neuromuscular re-education, Balance training, Gait training, Patient/Family education, Self Care, Joint mobilization, Aquatic Therapy, and Re-evaluation  PLAN FOR NEXT SESSION: gentle lumbar mobs; progress hip ext/abd strength; some balance work if time allows NuStep, Feet on pball, stretching, STS, Standing hip abd/ext, LAQ, HS curls, Walking on beam   9:10 AM,05/17/22 Cassie Freer PT, DPT [facility] [place]  [phone]

## 2022-05-31 ENCOUNTER — Ambulatory Visit: Payer: PPO

## 2022-06-06 ENCOUNTER — Ambulatory Visit: Payer: PPO

## 2022-06-07 DIAGNOSIS — Z6821 Body mass index (BMI) 21.0-21.9, adult: Secondary | ICD-10-CM | POA: Diagnosis not present

## 2022-06-07 DIAGNOSIS — R29898 Other symptoms and signs involving the musculoskeletal system: Secondary | ICD-10-CM | POA: Diagnosis not present

## 2022-06-07 DIAGNOSIS — I2510 Atherosclerotic heart disease of native coronary artery without angina pectoris: Secondary | ICD-10-CM | POA: Diagnosis not present

## 2022-06-07 DIAGNOSIS — R202 Paresthesia of skin: Secondary | ICD-10-CM | POA: Diagnosis not present

## 2022-06-08 ENCOUNTER — Other Ambulatory Visit: Payer: Self-pay | Admitting: Family Medicine

## 2022-06-08 DIAGNOSIS — R202 Paresthesia of skin: Secondary | ICD-10-CM

## 2022-06-08 DIAGNOSIS — R29898 Other symptoms and signs involving the musculoskeletal system: Secondary | ICD-10-CM

## 2022-07-10 ENCOUNTER — Other Ambulatory Visit (HOSPITAL_BASED_OUTPATIENT_CLINIC_OR_DEPARTMENT_OTHER): Payer: Self-pay | Admitting: Family

## 2022-07-10 DIAGNOSIS — I25810 Atherosclerosis of coronary artery bypass graft(s) without angina pectoris: Secondary | ICD-10-CM

## 2022-07-10 DIAGNOSIS — E785 Hyperlipidemia, unspecified: Secondary | ICD-10-CM

## 2022-07-10 DIAGNOSIS — I10 Essential (primary) hypertension: Secondary | ICD-10-CM

## 2022-07-11 ENCOUNTER — Ambulatory Visit
Admission: RE | Admit: 2022-07-11 | Discharge: 2022-07-11 | Disposition: A | Discharge status: Home / Self Care | Payer: PPO | Source: Ambulatory Visit | Attending: Family Medicine | Admitting: Family Medicine

## 2022-07-11 DIAGNOSIS — M48061 Spinal stenosis, lumbar region without neurogenic claudication: Secondary | ICD-10-CM | POA: Diagnosis not present

## 2022-07-11 DIAGNOSIS — R29898 Other symptoms and signs involving the musculoskeletal system: Secondary | ICD-10-CM

## 2022-07-11 DIAGNOSIS — R202 Paresthesia of skin: Secondary | ICD-10-CM

## 2022-07-11 DIAGNOSIS — R200 Anesthesia of skin: Secondary | ICD-10-CM | POA: Diagnosis not present

## 2022-07-11 DIAGNOSIS — M545 Low back pain, unspecified: Secondary | ICD-10-CM | POA: Diagnosis not present

## 2022-07-11 DIAGNOSIS — M4316 Spondylolisthesis, lumbar region: Secondary | ICD-10-CM | POA: Diagnosis not present

## 2022-07-14 DIAGNOSIS — E538 Deficiency of other specified B group vitamins: Secondary | ICD-10-CM | POA: Diagnosis not present

## 2022-07-14 DIAGNOSIS — I2510 Atherosclerotic heart disease of native coronary artery without angina pectoris: Secondary | ICD-10-CM | POA: Diagnosis not present

## 2022-07-24 DIAGNOSIS — M5416 Radiculopathy, lumbar region: Secondary | ICD-10-CM | POA: Diagnosis not present

## 2022-07-24 DIAGNOSIS — M9901 Segmental and somatic dysfunction of cervical region: Secondary | ICD-10-CM | POA: Diagnosis not present

## 2022-07-25 DIAGNOSIS — M9901 Segmental and somatic dysfunction of cervical region: Secondary | ICD-10-CM | POA: Diagnosis not present

## 2022-07-25 DIAGNOSIS — M5416 Radiculopathy, lumbar region: Secondary | ICD-10-CM | POA: Diagnosis not present

## 2022-07-27 DIAGNOSIS — M9901 Segmental and somatic dysfunction of cervical region: Secondary | ICD-10-CM | POA: Diagnosis not present

## 2022-07-27 DIAGNOSIS — M5416 Radiculopathy, lumbar region: Secondary | ICD-10-CM | POA: Diagnosis not present

## 2022-07-27 NOTE — Progress Notes (Deleted)
Cardiology Office Note:    Date:  07/27/2022   ID:  Carolyn Burns, DOB August 19, 1940, MRN [id number]  PCP:  Carolyn Caraway, MD   [facility] Providers Cardiologist:  Carolyn Bergeron, MD {   Referring MD: Carolyn Caraway, MD    History of Present Illness:    Carolyn Burns is a 82 y.o. female with a hx of CAD s/p CABG in 2017, HTN, and HLD who was previously followed by Dr. Meda Burns who now returns to clinic for follow-up.   Per review of the record, the patient was hospitalized with NSTEMI in 2017 found to have severe multivessel CAD s/p CABGx3 (LIMA-LAD, SVG-OM1, SVG-OM2). She had ischemic cardiomyopathy with previous EF 35-40% in 01/2016. LVEF improved post surgical revascularization with repeat echo LVEF 50-55%, mid anterior septal hypokinesis, grade 1 diastolic dysfunction, mild MR. Previously required down titration of Metoprolol due to bradycardia.   Was last seen in clinic by Carolyn Montana, NP where she was doing well from a CV standpoint.  Today, the patient overall feels well. No chest pain, SOB, lightheadedness, or dizziness. No LE edema, orthopnea or PND. Tolerating medications without issues. Has not had to use her nitro.   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   Past Medical History:  Diagnosis Date   CAD (coronary artery disease)    a. 01/2016 NSTEMI/Cath: LM 75, LAD 70ost, LCX 50ost, 70p, LPDA 70, OM1 90;  b. 02/10/2016 CABG x 3 (LIMA->LAD, VG->OM1, VG->OM2).   Ischemic cardiomyopathy    a. 02/02/2016 Echo: EF 35-40% w/ mid-apicalanteroseptal and apical AK, Gr1 DD-->high suspicion for takotsubo;  b. 02/16/2016 Echo (post-cabg): EF 50-55%, midanteroseptal HK, Gr1 DD, mild MR, PASP 36mHg, triv effusion.   Osteoarthritis    PVC's (premature ventricular contractions)    a. 02/2016 Freq pvc's noted post-op.    Past Surgical History:  Procedure Laterality Date   CARDIAC CATHETERIZATION N/A 02/01/2016   Procedure: Left Heart Cath and Coronary Angiography;   Surgeon: TTroy Sine MD;  [facility];  Service: Cardiovascular;  Laterality: N/A;   CORONARY ARTERY BYPASS GRAFT N/A 02/10/2016   Procedure: CORONARY ARTERY BYPASS GRAFTING (CABG)x 3 with endoscopic harvesting of right saphenous vein;  Surgeon: PIvin Poot MD;  [facility]  Service: Open Heart Surgery;  Laterality: N/A;   TEE WITHOUT CARDIOVERSION N/A 02/10/2016   Procedure: TRANSESOPHAGEAL ECHOCARDIOGRAM (TEE);  Surgeon: PIvin Poot MD;  [facility] [facility]  Service: Open Heart Surgery;  Laterality: N/A;    Current Medications: No outpatient medications have been marked as taking for the 08/07/22 encounter (Appointment) with PFreada Bergeron MD.     Allergies:   Aleve [naproxen sodium] and Codeine   Social History   Socioeconomic History   Marital status: Married    Spouse name: Not on file   Number of children: Not on file   Years of education: Not on file   Highest education level: Not on file  Occupational History   Not on file  Tobacco Use   Smoking status: Never   Smokeless tobacco: Never  Vaping Use   Vaping Use: Never used  Substance and Sexual Activity   Alcohol use: No   Drug use: No   Sexual activity: Not on file  Other Topics Concern   Not on file  Social History Narrative   Not on file   Social Determinants of Health   Financial Resource Strain: Not on file  Food Insecurity: Not on file  Transportation Needs: Not on file  Physical Activity: Not on file  Stress: Not on file  Social Connections: Not on file     Family History: The patient's family history is not on file. She was adopted.  ROS:   Please see the history of present illness.     All other systems reviewed and are negative.  EKGs/Labs/Other Studies Reviewed:    The following studies were reviewed today: TTE Feb 25, 2016: - Left ventricle: The cavity size was normal. Wall thickness was    normal. Systolic function was normal. The estimated ejection     fraction was in the range of 50% to 55%. There is hypokinesis of    the midanteroseptal myocardium. Doppler parameters are consistent    with abnormal left ventricular relaxation (grade 1 diastolic    dysfunction).  - Mitral valve: There was mild regurgitation.  - Pulmonary arteries: Systolic pressure was mildly increased. PA    peak pressure: 33 mm Hg (S).  - Pericardium, extracardiac: A trivial pericardial effusion was    identified posterior to the heart. There was no evidence of    hemodynamic compromise.   EKG:  No new tracing today  Recent Labs: No results found for requested labs within last 365 days.  Recent Lipid Panel    Component Value Date/Time   CHOL 124 02/25/2020 1016   TRIG 146 02/25/2020 1016   HDL 43 02/25/2020 1016   CHOLHDL 2.9 02/25/2020 1016   CHOLHDL 3.7 04/03/2016 1104   VLDL 20 04/03/2016 1104   LDLCALC 56 02/25/2020 1016     Risk Assessment/Calculations:           Physical Exam:    VS:  There were no vitals taken for this visit.    Wt Readings from Last 3 Encounters:  12/09/21 103 lb 6.4 oz (46.9 kg)  04/25/21 104 lb (47.2 kg)  08/26/20 101 lb 6.4 oz (46 kg)     GEN:  Well nourished, well developed in no acute distress HEENT: Normal NECK: No JVD; No carotid bruits CARDIAC: RRR, no murmurs, rubs, gallops RESPIRATORY:  Clear to auscultation without rales, wheezing or rhonchi  ABDOMEN: Soft, non-tender, non-distended MUSCULOSKELETAL:  No edema; No deformity  SKIN: Warm and dry NEUROLOGIC:  Alert and oriented x 3 PSYCHIATRIC:  Normal affect   ASSESSMENT:    No diagnosis found.  PLAN:    In order of problems listed above:  #CAD s/p CABG x3 in 2017: Patient has history of NSTEMI in 2017 with revealing severe multivessel CAD. She is now CABGx3 (LIMA-LAD, SVG-OM1, SVG-OM2) with Dr. Lawson Fiscal. EF prior to surgery 35-40% that improved to 50-55% following revascularization. Currently doing well without anginal symptoms.  -Continue ASA  49m daily -Continue crestor 262mdaily -Continue lisinopril 2.73m52maily  #HTN: Well controlled. -Continue lisinopril 2.73mg73mily  #HLD: -Continue crestor 20mg58mly -LDL 58 in 07/2020 (followed by PCP)          Medication Adjustments/Labs and Tests Ordered: Current medicines are reviewed at length with the patient today.  Concerns regarding medicines are outlined above.  No orders of the defined types were placed in this encounter.  No orders of the defined types were placed in this encounter.   There are no Patient Instructions on file for this visit.   Signed, HeathFreada Burns 07/27/2022 1:46 PM    [facility]

## 2022-08-01 DIAGNOSIS — M5416 Radiculopathy, lumbar region: Secondary | ICD-10-CM | POA: Diagnosis not present

## 2022-08-01 DIAGNOSIS — M9901 Segmental and somatic dysfunction of cervical region: Secondary | ICD-10-CM | POA: Diagnosis not present

## 2022-08-01 DIAGNOSIS — M9903 Segmental and somatic dysfunction of lumbar region: Secondary | ICD-10-CM | POA: Diagnosis not present

## 2022-08-02 ENCOUNTER — Telehealth: Payer: Self-pay | Admitting: Cardiology

## 2022-08-02 NOTE — Telephone Encounter (Signed)
Pt c/o medication issue:  1. Name of Medication:  rosuvastatin (CRESTOR) 20 MG tablet   2. How are you currently taking this medication (dosage and times per day)?   3. Are you having a reaction (difficulty breathing--STAT)?   4. What is your medication issue?   Called patient and rescheduled 3/04 appointment for 6/12 with Dr. Johney Frame. Patient mentioned that she really hoped to speak with Dr. Johney Frame sooner to  inform her that PCP, Dr. Addison Lank, increased Rosuvastatin to 40 MG daily. She would like to confirm whether Dr. Johney Frame agrees with this. Please advise.

## 2022-08-02 NOTE — Telephone Encounter (Signed)
Called the pt back.  Pt was originally scheduled to see Dr. Johney Frame on next Monday 3/4 but this was cancelled due to Dr. Johney Frame having a schedule change.   Scheduling dept rescheduled her to see Dr. Johney Frame in June.  Pt is due to see her in March.   Cancelled the pts June appt with Dr. Johney Frame and rescheduled her to see Dr. Johney Frame for next Tuesday August 08, 2022 at 1130.  She is aware to arrive 15 mins prior to this visit.    She is aware we will address her medication (recent crestor increase by PCP) concerns with her at her visit next week.   Pt verbalized understanding and agrees with this plan.

## 2022-08-03 DIAGNOSIS — M5416 Radiculopathy, lumbar region: Secondary | ICD-10-CM | POA: Diagnosis not present

## 2022-08-03 DIAGNOSIS — M9901 Segmental and somatic dysfunction of cervical region: Secondary | ICD-10-CM | POA: Diagnosis not present

## 2022-08-03 DIAGNOSIS — M9903 Segmental and somatic dysfunction of lumbar region: Secondary | ICD-10-CM | POA: Diagnosis not present

## 2022-08-05 NOTE — Progress Notes (Deleted)
Cardiology Office Note:    Date:  08/07/2022   ID:  Carolyn Burns, DOB [birth year], MRN [id number]  PCP:  Cari Caraway, MD   [facility] Providers Cardiologist:  Freada Bergeron, MD {   Referring MD: Cari Caraway, MD    History of Present Illness:    Carolyn Burns is a 82 y.o. female with a hx of CAD s/p CABG in 2017, HTN, and HLD who was previously followed by Dr. Meda Coffee who now returns to clinic for follow-up.   Per review of the record, the patient was hospitalized with NSTEMI in 2017 found to have severe multivessel CAD s/p CABGx3 (LIMA-LAD, SVG-OM1, SVG-OM2). She had ischemic cardiomyopathy with previous EF 35-40% in 01/2016. LVEF improved post surgical revascularization with repeat echo LVEF 50-55%, mid anterior septal hypokinesis, grade 1 diastolic dysfunction, mild MR. Previously required down titration of Metoprolol due to bradycardia.   Was last seen in clinic on 12/2021 where she was doing very well from a CV standpoint.   Today, ***  Past Medical History:  Diagnosis Date   CAD (coronary artery disease)    a. 01/2016 NSTEMI/Cath: LM 75, LAD 70ost, LCX 50ost, 70p, LPDA 70, OM1 90;  b. 02/10/2016 CABG x 3 (LIMA->LAD, VG->OM1, VG->OM2).   Ischemic cardiomyopathy    a. 02/02/2016 Echo: EF 35-40% w/ mid-apicalanteroseptal and apical AK, Gr1 DD-->high suspicion for takotsubo;  b. 02/16/2016 Echo (post-cabg): EF 50-55%, midanteroseptal HK, Gr1 DD, mild MR, PASP 70mHg, triv effusion.   Osteoarthritis    PVC's (premature ventricular contractions)    a. 02/2016 Freq pvc's noted post-op.    Past Surgical History:  Procedure Laterality Date   CARDIAC CATHETERIZATION N/A 02/01/2016   Procedure: Left Heart Cath and Coronary Angiography;  Surgeon: TTroy Sine MD;  [facility];  Service: Cardiovascular;  Laterality: N/A;   CORONARY ARTERY BYPASS GRAFT N/A 02/10/2016   Procedure: CORONARY ARTERY BYPASS GRAFTING (CABG)x 3 with endoscopic harvesting of  right saphenous vein;  Surgeon: PIvin Poot MD;  [facility]  Service: Open Heart Surgery;  Laterality: N/A;   TEE WITHOUT CARDIOVERSION N/A 02/10/2016   Procedure: TRANSESOPHAGEAL ECHOCARDIOGRAM (TEE);  Surgeon: PIvin Poot MD;  [facility] [facility]  Service: Open Heart Surgery;  Laterality: N/A;    Current Medications: No outpatient medications have been marked as taking for the 08/08/22 encounter (Appointment) with PFreada Bergeron MD.     Allergies:   Aleve [naproxen sodium] and Codeine   Social History   Socioeconomic History   Marital status: Married    Spouse name: Not on file   Number of children: Not on file   Years of education: Not on file   Highest education level: Not on file  Occupational History   Not on file  Tobacco Use   Smoking status: Never   Smokeless tobacco: Never  Vaping Use   Vaping Use: Never used  Substance and Sexual Activity   Alcohol use: No   Drug use: No   Sexual activity: Not on file  Other Topics Concern   Not on file  Social History Narrative   Not on file   Social Determinants of Health   Financial Resource Strain: Not on file  Food Insecurity: Not on file  Transportation Needs: Not on file  Physical Activity: Not on file  Stress: Not on file  Social Connections: Not on file     Family History: The patient's family history is not on file. She was  adopted.  ROS:   Please see the history of present illness.     All other systems reviewed and are negative.  EKGs/Labs/Other Studies Reviewed:    The following studies were reviewed today: Carotid Artery Ultrasound 08/2020: Summary:  Right Carotid: Velocities in the right ICA are consistent with a 1-39%  stenosis.                Essentially stable RICA stenosis.   Left Carotid: Velocities in the left ICA are consistent with a 1-39%  stenosis.               Essentially stable LICA stenosis.  TTE 02/2016: - Left ventricle: The cavity size was normal. Wall  thickness was    normal. Systolic function was normal. The estimated ejection    fraction was in the range of 50% to 55%. There is hypokinesis of    the midanteroseptal myocardium. Doppler parameters are consistent    with abnormal left ventricular relaxation (grade 1 diastolic    dysfunction).  - Mitral valve: There was mild regurgitation.  - Pulmonary arteries: Systolic pressure was mildly increased. PA    peak pressure: 33 mm Hg (S).  - Pericardium, extracardiac: A trivial pericardial effusion was    identified posterior to the heart. There was no evidence of    hemodynamic compromise.   EKG:  No new tracing today  Recent Labs: No results found for requested labs within last 365 days.  Recent Lipid Panel    Component Value Date/Time   CHOL 124 02/25/2020 1016   TRIG 146 02/25/2020 1016   HDL 43 02/25/2020 1016   CHOLHDL 2.9 02/25/2020 1016   CHOLHDL 3.7 04/03/2016 1104   VLDL 20 04/03/2016 1104   LDLCALC 56 02/25/2020 1016     Risk Assessment/Calculations:           Physical Exam:    VS:  There were no vitals taken for this visit.    Wt Readings from Last 3 Encounters:  12/09/21 103 lb 6.4 oz (46.9 kg)  04/25/21 104 lb (47.2 kg)  08/26/20 101 lb 6.4 oz (46 kg)     GEN:  Well nourished, well developed in no acute distress HEENT: Normal NECK: No JVD; No carotid bruits CARDIAC: RRR, no murmurs, rubs, gallops RESPIRATORY:  Clear to auscultation without rales, wheezing or rhonchi  ABDOMEN: Soft, non-tender, non-distended MUSCULOSKELETAL:  No edema; No deformity  SKIN: Warm and dry NEUROLOGIC:  Alert and oriented x 3 PSYCHIATRIC:  Normal affect   ASSESSMENT:    No diagnosis found.  PLAN:    In order of problems listed above:  #CAD s/p CABG x3 in 2017: Patient has history of NSTEMI in 2017 with revealing severe multivessel CAD. She is now CABGx3 (LIMA-LAD, SVG-OM1, SVG-OM2) with Dr. Lawson Fiscal. EF prior to surgery 35-40% that improved to 50-55% following  revascularization. Currently doing well without anginal symptoms.  -Continue ASA '81mg'$  daily -Continue crestor '20mg'$  daily -Continue lisinopril 2.'5mg'$  daily  #HTN: Well controlled. -Continue lisinopril 2.'5mg'$  daily  #HLD: -Continue crestor '20mg'$  daily -****          Medication Adjustments/Labs and Tests Ordered: Current medicines are reviewed at length with the patient today.  Concerns regarding medicines are outlined above.  No orders of the defined types were placed in this encounter.  No orders of the defined types were placed in this encounter.   There are no Patient Instructions on file for this visit.   Signed, Freada Bergeron, MD  08/07/2022 7:16 AM    [facility]

## 2022-08-07 ENCOUNTER — Ambulatory Visit: Payer: PPO | Admitting: Cardiology

## 2022-08-08 ENCOUNTER — Ambulatory Visit: Payer: PPO | Admitting: Cardiology

## 2022-08-08 DIAGNOSIS — M9903 Segmental and somatic dysfunction of lumbar region: Secondary | ICD-10-CM | POA: Diagnosis not present

## 2022-08-08 DIAGNOSIS — M9901 Segmental and somatic dysfunction of cervical region: Secondary | ICD-10-CM | POA: Diagnosis not present

## 2022-08-08 DIAGNOSIS — M5416 Radiculopathy, lumbar region: Secondary | ICD-10-CM | POA: Diagnosis not present

## 2022-08-10 DIAGNOSIS — M5416 Radiculopathy, lumbar region: Secondary | ICD-10-CM | POA: Diagnosis not present

## 2022-08-10 DIAGNOSIS — M9903 Segmental and somatic dysfunction of lumbar region: Secondary | ICD-10-CM | POA: Diagnosis not present

## 2022-08-10 DIAGNOSIS — M9901 Segmental and somatic dysfunction of cervical region: Secondary | ICD-10-CM | POA: Diagnosis not present

## 2022-08-15 DIAGNOSIS — M9901 Segmental and somatic dysfunction of cervical region: Secondary | ICD-10-CM | POA: Diagnosis not present

## 2022-08-15 DIAGNOSIS — M9903 Segmental and somatic dysfunction of lumbar region: Secondary | ICD-10-CM | POA: Diagnosis not present

## 2022-08-15 DIAGNOSIS — M5416 Radiculopathy, lumbar region: Secondary | ICD-10-CM | POA: Diagnosis not present

## 2022-08-17 DIAGNOSIS — M5416 Radiculopathy, lumbar region: Secondary | ICD-10-CM | POA: Diagnosis not present

## 2022-08-17 DIAGNOSIS — M9903 Segmental and somatic dysfunction of lumbar region: Secondary | ICD-10-CM | POA: Diagnosis not present

## 2022-08-17 DIAGNOSIS — M9901 Segmental and somatic dysfunction of cervical region: Secondary | ICD-10-CM | POA: Diagnosis not present

## 2022-08-22 DIAGNOSIS — M9903 Segmental and somatic dysfunction of lumbar region: Secondary | ICD-10-CM | POA: Diagnosis not present

## 2022-08-22 DIAGNOSIS — M9901 Segmental and somatic dysfunction of cervical region: Secondary | ICD-10-CM | POA: Diagnosis not present

## 2022-08-22 DIAGNOSIS — M5416 Radiculopathy, lumbar region: Secondary | ICD-10-CM | POA: Diagnosis not present

## 2022-08-29 DIAGNOSIS — M9903 Segmental and somatic dysfunction of lumbar region: Secondary | ICD-10-CM | POA: Diagnosis not present

## 2022-08-29 DIAGNOSIS — M5416 Radiculopathy, lumbar region: Secondary | ICD-10-CM | POA: Diagnosis not present

## 2022-08-29 DIAGNOSIS — M9901 Segmental and somatic dysfunction of cervical region: Secondary | ICD-10-CM | POA: Diagnosis not present

## 2022-08-31 DIAGNOSIS — M9901 Segmental and somatic dysfunction of cervical region: Secondary | ICD-10-CM | POA: Diagnosis not present

## 2022-08-31 DIAGNOSIS — M5416 Radiculopathy, lumbar region: Secondary | ICD-10-CM | POA: Diagnosis not present

## 2022-08-31 DIAGNOSIS — M9903 Segmental and somatic dysfunction of lumbar region: Secondary | ICD-10-CM | POA: Diagnosis not present

## 2022-09-05 DIAGNOSIS — M5416 Radiculopathy, lumbar region: Secondary | ICD-10-CM | POA: Diagnosis not present

## 2022-09-05 DIAGNOSIS — M9903 Segmental and somatic dysfunction of lumbar region: Secondary | ICD-10-CM | POA: Diagnosis not present

## 2022-09-05 DIAGNOSIS — M9901 Segmental and somatic dysfunction of cervical region: Secondary | ICD-10-CM | POA: Diagnosis not present

## 2022-09-13 ENCOUNTER — Ambulatory Visit: Payer: PPO | Attending: Cardiology | Admitting: Cardiology

## 2022-09-13 ENCOUNTER — Encounter: Payer: Self-pay | Admitting: Physician Assistant

## 2022-09-13 ENCOUNTER — Ambulatory Visit (INDEPENDENT_AMBULATORY_CARE_PROVIDER_SITE_OTHER): Payer: PPO

## 2022-09-13 VITALS — BP 144/82 | HR 73 | Ht 61.0 in | Wt 110.2 lb

## 2022-09-13 DIAGNOSIS — E7800 Pure hypercholesterolemia, unspecified: Secondary | ICD-10-CM

## 2022-09-13 DIAGNOSIS — I493 Ventricular premature depolarization: Secondary | ICD-10-CM

## 2022-09-13 DIAGNOSIS — I10 Essential (primary) hypertension: Secondary | ICD-10-CM | POA: Diagnosis not present

## 2022-09-13 DIAGNOSIS — I214 Non-ST elevation (NSTEMI) myocardial infarction: Secondary | ICD-10-CM

## 2022-09-13 NOTE — Assessment & Plan Note (Signed)
Patient with 5 PVCs on 10 second ECG strip today. Denies palpitations/other symptoms. Frequent ectopic beats on physical exam. Given hx bradycardia with beta blockers, will assess PVC burden with 3 day static Zio. Will also check electrolytes today. If PVC burden is greater than 15%, would consider adding back low dose Metoprolol and checking echocardiogram.

## 2022-09-13 NOTE — Assessment & Plan Note (Signed)
Initial clinic BP 132/60, repeat measured higher at 144/82. BP measurement challenging due to frequency of PVCs with compensatory pause. Since BP has not been high in recent visits, instructed her to keep a log of blood pressure readings over the next 2 weeks. Will follow up to evaluate home readings. Continue Lisinopril 2.5mg  QD.

## 2022-09-13 NOTE — Progress Notes (Unsigned)
Applied a 3 day Zio XT monitor to patient in the office  Dr Shari Prows to read

## 2022-09-13 NOTE — Progress Notes (Signed)
Cardiology Office Note:    Date:  09/13/2022   ID:  Carolyn Burns, DOB Apr 10, 1941, MRN [id number]  PCP:  Gweneth Dimitri, MD  [facility] Providers Cardiologist:  Meriam Sprague, MD    Referring MD: Gweneth Dimitri, MD   Chief Complaint:  Hyperlipidemia    Patient Profile:  CAD s/p CABGx3 (LIMA-LAD, SVG-OM1, SVG-OM2) Hypertension Hyperlipidemia Heart failure (recovered EF following revascularization): Patient noted to have reduced LVEF 35-40% with improvement to 50-55% following revascularization.  Cardiac Studies & Procedures   CARDIAC CATHETERIZATION  CARDIAC CATHETERIZATION 02/02/2016  Narrative  LM lesion, 75 %stenosed.  Ost LAD lesion, 70 %stenosed.  Prox LAD to Mid LAD lesion, 80 %stenosed.  LPDA lesion, 70 %stenosed.  Ost Cx to Prox Cx lesion, 50 %stenosed.  Prox Cx lesion, 70 %stenosed.  1st Mrg lesion, 90 %stenosed.  There is severe left ventricular systolic dysfunction.  LV end diastolic pressure is moderately elevated.  Severe two-vessel coronary artery disease with separate ostium to the LAD and circumflex vessel with ostial 70% diffuse LAD stenosis followed by 70%and 80% proximal to mid stenoses; 50 and 70% proximal circumflex stenosis with 90% stenosis in a large OM1 vessel and 70% distal stenosis in the PLA branch of the dominant circumflex vessel, and small nondominant RCA.  Severe LV dysfunction with significant hypo-to akinesis involving the mid distal anterolateral wall, apex and mid distal inferior wall.  Ejection fraction is 25%.  RECOMMENDATION: Surgical consultation for consideration of CABG revascularization surgery.  Findings Coronary Findings Diagnostic  Dominance: Left  Left Main There appear to be a separate ostium to the LAD and left circumflex vessel.  Left Anterior Descending The LAD was diffusely diseased.  There was 70% ostial stenosis followed by 70% proximal and 80% proximal to mid stenoses in the region  of the first diagonal takeoff.  Left Circumflex The circumflex seemed to have a separate ostium from the LAD.  There was 50% followed by 70% proximal stenoses.  The circumflex marginal 1 vessel was large caliber and had 90% focal stenosis.  The distal PLA branch of the dominant circumflex had 70% stenosis.  First Obtuse Marginal Branch  Left Posterior Descending Artery  Right Coronary Artery The RCA was a very small nondominant vessel that gave rise to several small proximal branches.  Intervention  No interventions have been documented.     ECHOCARDIOGRAM  ECHOCARDIOGRAM COMPLETE 02/16/2016  Narrative *[place]* *[facility]* [address] [place], [place] [address] [phone]  ------------------------------------------------------------------- Transthoracic Echocardiography  Patient:    Carolyn Burns, Carolyn Burns MR #:       [id number] Study Date: 02/16/2016 Gender:     F Age:        82 Height:     154.9 cm Weight:     48.5 kg BSA:        1.44 m^2 Pt. Status: Room:       2W22C  ATTENDING    Lovett Sox SONOGRAPHER  Delcie Roch, RDCS, CCT ORDERING     Tobias Alexander, M.D. PERFORMING   [facility], Inpatient ADMITTING    Corotto, Ollen Gross  cc:  ------------------------------------------------------------------- LV EF: 50% -   55%  ------------------------------------------------------------------- Indications:      Cardiomyopathy - ischemic 414.8.  ------------------------------------------------------------------- History:   Risk factors:  NSTEMI. Status post CABG.  ------------------------------------------------------------------- Study Conclusions  - Left ventricle: The cavity size was normal. Wall thickness was normal. Systolic function was normal. The estimated ejection fraction was in the range of 50% to  55%. There is hypokinesis of the midanteroseptal myocardium. Doppler parameters are consistent with abnormal left ventricular  relaxation (grade 1 diastolic dysfunction). - Mitral valve: There was mild regurgitation. - Pulmonary arteries: Systolic pressure was mildly increased. PA peak pressure: 33 mm Hg (S). - Pericardium, extracardiac: A trivial pericardial effusion was identified posterior to the heart. There was no evidence of hemodynamic compromise.  Impressions:  - EF improved (prior 45%)  ------------------------------------------------------------------- Study data:  Comparison was made to the study of 02/04/2016.  Study status:  Routine.  Procedure:  The patient reported no pain pre or post test. Transthoracic echocardiography. Image quality was adequate.  Study completion:  There were no complications. Transthoracic echocardiography.  M-mode, complete 2D, spectral Doppler, and color Doppler.  Birthdate:  Patient birthdate: [birth year].  Age:  Patient is 82 yr old.  Sex:  Gender: female. BMI: 20.2 kg/m^2.  Blood pressure:     149/76  Patient status: Inpatient.  Study date:  Study date: 02/16/2016. Study time: 09:25 AM.  Location:  Bedside.  -------------------------------------------------------------------  ------------------------------------------------------------------- Left ventricle:  The cavity size was normal. Wall thickness was normal. Systolic function was normal. The estimated ejection fraction was in the range of 50% to 55%.  Regional wall motion abnormalities:   There is hypokinesis of the midanteroseptal myocardium. Doppler parameters are consistent with abnormal left ventricular relaxation (grade 1 diastolic dysfunction).  ------------------------------------------------------------------- Aortic valve:   Trileaflet; normal thickness leaflets. Mobility was not restricted.  Doppler:  Transvalvular velocity was within the normal range. There was no stenosis. There was no regurgitation.  ------------------------------------------------------------------- Aorta:  Aortic root: The  aortic root was normal in size.  ------------------------------------------------------------------- Mitral valve:   Structurally normal valve.   Mobility was not restricted.  Doppler:  Transvalvular velocity was within the normal range. There was no evidence for stenosis. There was mild regurgitation.  ------------------------------------------------------------------- Left atrium:  The atrium was normal in size.  ------------------------------------------------------------------- Right ventricle:  The cavity size was normal. Wall thickness was normal. Systolic function was normal.  ------------------------------------------------------------------- Pulmonic valve:   Poorly visualized.  Structurally normal valve. Cusp separation was normal.  Doppler:  Transvalvular velocity was within the normal range. There was no evidence for stenosis. There was no regurgitation.  ------------------------------------------------------------------- Tricuspid valve:   Structurally normal valve.    Doppler: Transvalvular velocity was within the normal range. There was mild regurgitation.  ------------------------------------------------------------------- Pulmonary artery:   The main pulmonary artery was normal-sized. Systolic pressure was mildly increased.  ------------------------------------------------------------------- Right atrium:  The atrium was normal in size.  ------------------------------------------------------------------- Pericardium:  A trivial pericardial effusion was identified posterior to the heart. There was no evidence of hemodynamic compromise.  ------------------------------------------------------------------- Systemic veins: Inferior vena cava: The vessel was normal in size.  ------------------------------------------------------------------- Measurements  Left ventricle                         Value        Reference LV ID, ED, PLAX chordal        (L)     40.9   mm     43 - 52 LV ID, ES, PLAX chordal                29.6  mm     23 - 38 LV fx shortening, PLAX chordal (L)     28    %      >=29 LV PW thickness, ED  7.79  mm     --------- IVS/LV PW ratio, ED                    0.74         <=1.3  Ventricular septum                     Value        Reference IVS thickness, ED                      5.79  mm     ---------  LVOT                                   Value        Reference LVOT ID, S                             17    mm     --------- LVOT area                              2.27  cm^2   ---------  Aorta                                  Value        Reference Aortic root ID, ED                     25    mm     ---------  Left atrium                            Value        Reference LA ID, A-P, ES                         32    mm     --------- LA ID/bsa, A-P                 (H)     2.22  cm/m^2 <=2.2 LA volume, S                           37.7  ml     --------- LA volume/bsa, S                       26.1  ml/m^2 --------- LA volume, ES, 1-p A4C                 34    ml     --------- LA volume/bsa, ES, 1-p A4C             23.5  ml/m^2 --------- LA volume, ES, 1-p A2C                 37.8  ml     --------- LA volume/bsa, ES, 1-p A2C             26.2  ml/m^2 ---------  Pulmonary arteries  Value        Reference PA pressure, S, DP             (H)     33    mm Hg  <=30  Tricuspid valve                        Value        Reference Tricuspid regurg peak velocity         273   cm/s   --------- Tricuspid peak RV-RA gradient          30    mm Hg  ---------  Systemic veins                         Value        Reference Estimated CVP                          3     mm Hg  ---------  Right ventricle                        Value        Reference RV pressure, S, DP             (H)     33    mm Hg  <=30  Legend: (L)  and  (H)  mark values outside specified reference  range.  ------------------------------------------------------------------- Prepared and Electronically Authenticated by  Donato Schultz, M.D. 2017-09-13T13:09:02   TEE  ECHO TEE 02/11/2016  Narrative *[place]* *[facility]* [address] [place], [place] [address] [phone]  ------------------------------------------------------------------- Intraoperative Transesophageal Echocardiography  Patient:    Carolyn Burns, Carolyn Burns MR #:       [id number] Study Date: 02/10/2016 Gender:     F Age:        82 Height:     154.9 cm Weight:     51.1 kg BSA:        1.49 m^2 Pt. Status: Room:       2S03C  ATTENDING    Drue Novel SONOGRAPHER  Cathie Beams PERFORMING   Shona Simpson, MD ADMITTING    Lourdes Sledge S  cc:  ------------------------------------------------------------------- LV EF: 50% -  ------------------------------------------------------------------- Study Conclusions  - Left ventricle: The cavity size was normal. Wall thickness was normal. The estimated ejection fraction was = 50%. There was no dynamic obstruction. Mild diffuse hypokinesis with distinct regional wall motion abnormalities. Hypokinesis of the inferoseptal and apical myocardium; consistent with ischemia. - Aortic valve: There was trivial regurgitation directed centrally in the LVOT. There was no significant perivalvular regurgitation. - Atrial septum: No defect or patent foramen ovale was identified.  Intraoperative transesophageal echocardiography.  Birthdate: Patient birthdate: Oct 09, 1940.  Age:  Patient is 82 yr old.  Sex: Gender: female.    BMI: 21.3 kg/m^2.  Study date:  Study date: 02/10/2016. Study time: 07:53 AM.  -------------------------------------------------------------------  ------------------------------------------------------------------- Left ventricle:  Well visualized. The cavity size was  normal. Wall thickness was normal. There was no hypertrophy. The estimated ejection fraction was = 50%. There was no dynamic obstruction. Mild diffuse hypokinesis with distinct regional wall motion abnormalities.  Regional wall motion abnormalities:   Hypokinesis of the inferoseptal and apical myocardium; consistent with ischemia.  ------------------------------------------------------------------- Aortic valve:  Well  visualized.  Normal-sized annulus. Structurally normal valve. The valve appears to be grossly normal. Trileaflet; normal thickness leaflets. Cusp separation was normal.  Doppler: Transvalvular velocity was within the normal range. There was no stenosis. There was trivial regurgitation directed centrally in the LVOT. There was no significant perivalvular regurgitation.  ------------------------------------------------------------------- Aorta:   The arch was left-sided.   The aorta was well visualized, not dilated, non-tortuous, trivially calcified, and trivially diseased.  ------------------------------------------------------------------- Mitral valve:  Well visualized.  Normal-sized annulus. Structurally normal valve. The valve appears to be grossly normal. Leaflet separation was normal. There was no systolic anterior motion. Doppler:  Transvalvular velocity was within the normal range. There was no evidence for stenosis. There was trivial regurgitation.  ------------------------------------------------------------------- Left atrium:  Well visualized. The atrium was normal in size.  ------------------------------------------------------------------- Atrial septum:  Well visualized. The septum was normal. No defect or patent foramen ovale was identified.  ------------------------------------------------------------------- Right ventricle:  Well visualized. The cavity size was normal. Wall thickness was normal. There was no evidence of concentric hypertrophy.  Systolic function was normal. There were no regional wall motion abnormalities.  ------------------------------------------------------------------- Pulmonic valve:   Well visualized.  Normal-sized annulus. Structurally normal valve. The valve appears to be grossly normal. Cusp separation was normal.  Doppler:  Transvalvular velocity was within the normal range. There was no significant regurgitation.  ------------------------------------------------------------------- Tricuspid valve:  Well visualized.  Normal-sized annulus. Structurally normal valve. The valve appears to be grossly normal. Leaflet separation was normal.  Doppler:  There was trivial regurgitation directed centrally.  ------------------------------------------------------------------- Right atrium:  Well visualized. The atrium was normal in size.  ------------------------------------------------------------------- Pericardium:  The pericardium was normal in appearance. There was no pericardial effusion.  ------------------------------------------------------------------- Post bypass:  - LV size was normal. - The estimated LV ejection fraction was 50-55%. - Hypokinesis of the apical septal LV myocardium, improved from the prior stage and improved from baseline.  Aortic valve: stenosis; unchanged from the prior stage.  ------------------------------------------------------------------- Post procedure conclusions Ascending Aorta:  - The aorta was well visualized, not dilated, non-tortuous, trivially calcified, and trivially diseased.  ------------------------------------------------------------------- Measurements  Left ventricle                          Value    Reference LV ID, ED, PLAX chordal        (L)      38.7  mm 43 - 52 LV ID, ES, PLAX chordal                 26.1  mm 23 - 38 LV fx shortening, PLAX chordal          33    %  >=29  Legend: (L)  and  (H)  mark values outside specified reference  range.  ------------------------------------------------------------------- Prepared and Electronically Authenticated by  Shona SimpsonKevin Hollis, MD 2017-09-08T19:15:34             History of Present Illness:   Carolyn Burns is a 82 y.o. female with the above problem list.  She presents today for routine follow-up of chronic conditions including hyperlipidemia. Patient was hospitalized in 2017 with NSTEMI, found with severe multi-vessel CAD and underwent CABG x3 (LIMA-LAD, SVG-OM1, SVG-OM2). Patient previously on Metoprolol following NSTEMI but this was discontinued in 2022 due to bradycardia. Patient last seen in office by Dr. Shari ProwsPemberton in July 2023.   Patient continues to work in Production assistant, [employer] business. Today she denies chest pain, shortness of breath, exertional intolerance,  lightheadedness/dizziness, palpitations, edema, orthopnea. She has no concerns of medication side effects. Patient's only question today is regarding increased dose of Crestor. Per patient, she has been taking 10mg  but her PCP recently recommended increase to 20mg  QD due to increased LDL to 65. She has been taking this higher dose for a couple of months and denies myalgias or other side effects.      Past Medical History:  Diagnosis Date   CAD (coronary artery disease)    a. 01/2016 NSTEMI/Cath: LM 75, LAD 70ost, LCX 50ost, 70p, LPDA 70, OM1 90;  b. 02/10/2016 CABG x 3 (LIMA->LAD, VG->OM1, VG->OM2).   Ischemic cardiomyopathy    a. 02/02/2016 Echo: EF 35-40% w/ mid-apicalanteroseptal and apical AK, Gr1 DD-->high suspicion for takotsubo;  b. 02/16/2016 Echo (post-cabg): EF 50-55%, midanteroseptal HK, Gr1 DD, mild MR, PASP , triv effusion.   Osteoarthritis    PVC's (premature ventricular contractions)    a. 02/2016 Freq pvc's noted post-op.   Current Medications: Current Meds  Medication Sig   aspirin EC 81 MG EC tablet Take 1 tablet (81 mg total) by mouth daily.   celecoxib (CELEBREX) 200 MG capsule Take 200 mg by mouth  daily.   cholecalciferol (VITAMIN D) 1000 units tablet Take 1,000 Units by mouth daily.    estradiol (ESTRACE) 0.5 MG tablet Take 0.5 mg by mouth daily.   lisinopril (ZESTRIL) 2.5 MG tablet TAKE 1 TABLET BY MOUTH EVERY DAY   progesterone (PROMETRIUM) 100 MG capsule Take 100 mg by mouth daily.   ranitidine (ZANTAC) 150 MG tablet Take 150 mg by mouth daily as needed for heartburn.   rosuvastatin (CRESTOR) 20 MG tablet Take 1 tablet (20 mg total) by mouth daily.    Allergies:   Aleve [naproxen sodium] and Codeine   Social History   Occupational History   Not on file  Tobacco Use   Smoking status: Never   Smokeless tobacco: Never  Vaping Use   Vaping Use: Never used  Substance and Sexual Activity   Alcohol use: No   Drug use: No   Sexual activity: Not on file    Family Hx: The patient's family history is not on file. She was adopted.  Review of Systems  Constitutional: Negative for weight gain and weight loss.  Cardiovascular:  Negative for chest pain, dyspnea on exertion, irregular heartbeat, leg swelling, orthopnea, palpitations, paroxysmal nocturnal dyspnea and syncope.  Respiratory:  Negative for cough, shortness of breath and snoring.   All other systems reviewed and are negative.    EKGs/Labs/Other Test Reviewed:    EKG:  EKG is ordered today.  The ekg ordered today demonstrates sinus rhythm with frequent PVCs, 5 in a 10 second strip.    Recent Labs: No results found for requested labs within last 365 days.   Recent Lipid Panel No results for input(s): "CHOL", "TRIG", "HDL", "VLDL", "LDLCALC", "LDLDIRECT" in the last 8760 hours.    Risk Assessment/Calculations/Metrics:         HYPERTENSION CONTROL Vitals:   09/13/22 1322 09/13/22 1344  BP: 132/60 (!) 144/82    The patient's blood pressure is elevated above target today.  In order to address the patient's elevated BP: Blood pressure will be monitored at home to determine if medication changes need to be  made.       Physical Exam:    VS:  BP (!) 144/82   Pulse 73   Ht 5\' 1"  (1.549 m)   Wt 110 lb 3.2 oz (50 kg)  SpO2 97%   BMI 20.82 kg/m     Wt Readings from Last 3 Encounters:  09/13/22 110 lb 3.2 oz (50 kg)  12/09/21 103 lb 6.4 oz (46.9 kg)  04/25/21 104 lb (47.2 kg)    Vitals reviewed.  Constitutional:      Appearance: Healthy appearance.  Eyes:     Pupils: Pupils are equal, round, and reactive to light.  Pulmonary:     Effort: Pulmonary effort is normal.  Cardiovascular:     PMI at left midclavicular line. Normal rate. Frequent ectopic beats. Regular rhythm. Normal S1. Normal S2.      Murmurs: There is no murmur.     No gallop.  No click. No rub.  Pulses:    Intact distal pulses.  Edema:    Peripheral edema absent.  Abdominal:     General: Bowel sounds are normal.     Palpations: Abdomen is soft.  Musculoskeletal: Normal range of motion. Skin:    General: Skin is warm and dry.  Neurological:     Mental Status: Alert and oriented to person, place and time.         ASSESSMENT & PLAN:   NSTEMI (non-ST elevated myocardial infarction) [facility]) Patient with no anginal symptoms or dyspnea. Continue ASA 81mg , Crestor 20mg , Lisinopril 2.5mg .  PVC's (premature ventricular contractions) Patient with 5 PVCs on 10 second ECG strip today. Denies palpitations/other symptoms. Frequent ectopic beats on physical exam. Given hx bradycardia with beta blockers, will assess PVC burden with 3 day static Zio. Will also check electrolytes today. If PVC burden is greater than 15%, would consider adding back low dose Metoprolol and checking echocardiogram.  Hypertension Initial clinic BP 132/60, repeat measured higher at 144/82. BP measurement challenging due to frequency of PVCs with compensatory pause. Since BP has not been high in recent visits, instructed her to keep a log of blood pressure readings over the next 2 weeks. Will follow up to evaluate home readings. Continue Lisinopril  2.5mg  QD.  Hyperlipidemia Patient's LDL 65 as of 07/14/22, increased from 56 in 2021. PCP has increased Crestor to 20mg . Discussed with patient that newer guidelines from ESC/EAS suggest LDL 54mg /dL in patients with her history and that with history, increased Crestor dosing is appropriate/safe.             Dispo:  No follow-ups on file.   Medication Adjustments/Labs and Tests Ordered: Current medicines are reviewed at length with the patient today.  Concerns regarding medicines are outlined above.  Tests Ordered: Orders Placed This Encounter  Procedures   Basic metabolic panel   Magnesium   LONG TERM MONITOR (3-14 DAYS)   EKG 12-Lead   Medication Changes: No orders of the defined types were placed in this encounter.  Signed, Perlie Gold, PA-C  09/13/2022 4:56 PM    [facility] [address] [place], [place], [place]  [address] Phone: [phone]; Fax: [phone]

## 2022-09-13 NOTE — Patient Instructions (Addendum)
Medication Instructions:  Your physician recommends that you continue on your current medications as directed. Please refer to the Current Medication list given to you today.  *If you need a refill on your cardiac medications before your next appointment, please call your pharmacy*   Lab Work: TODAY:  BMET & MAG  If you have labs (blood work) drawn today and your tests are completely normal, you will receive your results only by: MyChart Message (if you have MyChart) OR A paper copy in the mail If you have any lab test that is abnormal or we need to change your treatment, we will call you to review the results.   Testing/Procedures: Carolyn Burns- Long Term Monitor Instructions  Your physician has requested you wear a ZIO patch monitor for 3 days.  This is a single patch monitor. [employer] supplies one patch monitor per enrollment. Additional stickers are not available. Please do not apply patch if you will be having a Nuclear Stress Test,  Echocardiogram, Cardiac CT, MRI, or Chest Xray during the period you would be wearing the  monitor. The patch cannot be worn during these tests. You cannot remove and re-apply the  ZIO XT patch monitor.  Your ZIO patch monitor will be mailed 3 day USPS to your address on file. It may take 3-5 days  to receive your monitor after you have been enrolled.  Once you have received your monitor, please review the enclosed instructions. Your monitor  has already been registered assigning a specific monitor serial # to you.  Billing and Patient Assistance Program Information  We have supplied [employer] with any of your insurance information on file for billing purposes. [employer] offers a sliding scale Patient Assistance Program for patients that do not have  insurance, or whose insurance does not completely cover the cost of the ZIO monitor.  You must apply for the Patient Assistance Program to qualify for this discounted rate.  To apply, please call [employer] at  [phone], select option 4, select option 2, ask to apply for  Patient Assistance Program. Meredeth Ide will ask your household income, and how many people  are in your household. They will quote your out-of-pocket cost based on that information.  [employer] will also be able to set up a 57-month, interest-free payment plan if needed.  Applying the monitor   Shave hair from upper left chest.  Hold abrader disc by orange tab. Rub abrader in 40 strokes over the upper left chest as  indicated in your monitor instructions.  Clean area with 4 enclosed alcohol pads. Let dry.  Apply patch as indicated in monitor instructions. Patch will be placed under collarbone on left  side of chest with arrow pointing upward.  Rub patch adhesive wings for 2 minutes. Remove white label marked "1". Remove the white  label marked "2". Rub patch adhesive wings for 2 additional minutes.  While looking in a mirror, press and release button in center of patch. A small green light will  flash 3-4 times. This will be your only indicator that the monitor has been turned on.  Do not shower for the first 24 hours. You may shower after the first 24 hours.  Press the button if you feel a symptom. You will hear a small click. Record Date, Time and  Symptom in the Patient Logbook.  When you are ready to remove the patch, follow instructions on the last 2 pages of Patient  Logbook. Stick patch monitor onto the last page of Patient Logbook.  Place Patient Logbook in the blue and white box. Use locking tab on box and tape box closed  securely. The blue and white box has prepaid postage on it. Please place it in the mailbox as  soon as possible. Your physician should have your test results approximately 7 days after the  monitor has been mailed back to [facility].  Call [facility] Customer Care at [phone] if you have questions regarding  your ZIO XT patch monitor. Call them immediately if you see an orange light  blinking on your  monitor.  If your monitor falls off in less than 4 days, contact our Monitor department at [phone].  If your monitor becomes loose or falls off after 4 days call [employer] at [phone] for  suggestions on securing your monitor    Follow-Up: At [facility], you and your health needs are our priority.  As part of our continuing mission to provide you with exceptional heart care, we have created designated Provider Care Teams.  These Care Teams include your primary Cardiologist (physician) and Advanced Practice Providers (APPs -  Physician Assistants and Nurse Practitioners) who all work together to provide you with the care you need, when you need it.  We recommend signing up for the patient portal called "MyChart".  Sign up information is provided on this After Visit Summary.  MyChart is used to connect with patients for Virtual Visits (Telemedicine).  Patients are able to view lab/test results, encounter notes, upcoming appointments, etc.  Non-urgent messages can be sent to your provider as well.   To learn more about what you can do with MyChart, go to [email or web address].    Your next appointment:    Your physician wants you to follow-up in: 6 MONTHS.   You will receive a reminder letter in the mail two months in advance. If you don't receive a letter, please call our office to schedule the follow-up appointment.   Provider:   Meriam Sprague, MD     Other Instructions

## 2022-09-13 NOTE — Assessment & Plan Note (Signed)
Patient's LDL 65 as of 07/14/22, increased from 56 in 2021. PCP has increased Crestor to 20mg . Discussed with patient that newer guidelines from ESC/EAS suggest LDL 54mg /dL in patients with her history and that with history, increased Crestor dosing is appropriate/safe.

## 2022-09-13 NOTE — Assessment & Plan Note (Signed)
Patient with no anginal symptoms or dyspnea. Continue ASA 81mg , Crestor 20mg , Lisinopril 2.5mg .

## 2022-09-14 LAB — BASIC METABOLIC PANEL
BUN/Creatinine Ratio: 14 (ref 12–28)
BUN: 12 mg/dL (ref 8–27)
CO2: 21 mmol/L (ref 20–29)
Calcium: 9.7 mg/dL (ref 8.7–10.3)
Chloride: 102 mmol/L (ref 96–106)
Creatinine, Ser: 0.88 mg/dL (ref 0.57–1.00)
Glucose: 109 mg/dL — ABNORMAL HIGH (ref 70–99)
Potassium: 4.4 mmol/L (ref 3.5–5.2)
Sodium: 142 mmol/L (ref 134–144)
eGFR: 66 mL/min/{1.73_m2} (ref >59)

## 2022-09-14 LAB — MAGNESIUM: Magnesium: 2.1 mg/dL (ref 1.6–2.3)

## 2022-09-21 ENCOUNTER — Ambulatory Visit: Payer: PPO | Admitting: Cardiology

## 2022-09-22 ENCOUNTER — Telehealth: Payer: Self-pay

## 2022-09-22 DIAGNOSIS — I25810 Atherosclerosis of coronary artery bypass graft(s) without angina pectoris: Secondary | ICD-10-CM

## 2022-09-22 DIAGNOSIS — I10 Essential (primary) hypertension: Secondary | ICD-10-CM

## 2022-09-22 DIAGNOSIS — E785 Hyperlipidemia, unspecified: Secondary | ICD-10-CM

## 2022-09-22 MED ORDER — LISINOPRIL 5 MG PO TABS
5.0000 mg | ORAL_TABLET | Freq: Every day | ORAL | 3 refills | Status: DC
Start: 2022-09-22 — End: 2024-01-29

## 2022-09-22 NOTE — Telephone Encounter (Signed)
-----   Message from Perlie Gold, PA-C sent at 09/21/2022  8:40 PM EDT ----- Regarding: Home BP log follow up Hello!  This patient had slightly elevated BP at her last clinic visit but said home BP was typically lower. I asked her to record daily pressures for a week. When able, please call to inquire about readings. Her MyChart is not active for messages.  Thanks!  Perlie Gold PA-C

## 2022-09-22 NOTE — Telephone Encounter (Addendum)
Called patient back to let her know Perlie Gold PA recommend she start taking lisinopril 5 mg by mouth daily. Patient agreed to plan and will start taking the increased dose. Patient stated that her voice get raspy with the lisinopril, and she will call if she has any issues with this. Encouraged patient to continue to monitor her BP, and if it is continuously over 130/80 to give our office a call and let us know. Patient verbalized understanding. Updated patient's medication list and sent prescription into patient's pharmacy.

## 2022-09-22 NOTE — Telephone Encounter (Signed)
Follow up call to patient to review BP readings at home. 4/17- 163/74 4/18- 135/74, 136/71 4/19-159/75. Patient states she takes her medications at night and has no symptoms. She will continue to monitor her BP and record readings daily. Advised I would forward her readings to Perlie Gold, PA for review.

## 2022-09-22 NOTE — Addendum Note (Signed)
Addended by: Virl Axe, Marke Goodwyn L on: 09/22/2022 10:02 AM   Modules accepted: Orders

## 2022-09-28 ENCOUNTER — Telehealth: Payer: Self-pay | Admitting: Cardiology

## 2022-09-28 DIAGNOSIS — I493 Ventricular premature depolarization: Secondary | ICD-10-CM | POA: Diagnosis not present

## 2022-09-28 NOTE — Telephone Encounter (Signed)
-----   Message from Perlie Gold, PA-C sent at 09/27/2022  9:38 AM EDT ----- Regarding: BP log follow up Please call patient at earliest convenience and request recent blood pressure readings from her log. BP was just above goal at last office visit and we decided to monitor at home.  Thanks!  Perlie Gold

## 2022-09-28 NOTE — Telephone Encounter (Signed)
Spoke with patient to discuss BP log.  Per Carolyn Gold, PA-C: Please call patient at earliest convenience and request recent blood pressure readings from her log. BP was just above goal at last office visit and we decided to monitor at home.    Patient states she is not currently at home, but reports BP has been consistently around 130's/70's. She states she does have some intermittent voice raspiness, though this is not new and not bothersome.  BP Readings 4/25--135/77 4/24--134/72 4/23--134/73  Requested patient call our office or send a MyChart message with BP log readings when she is back home for provider to review.  Patient verbalized understanding. She asked if monitor results have been read yet. I did not see provider note yet.  Will forward to Carolyn Gold, PA-C to review.

## 2022-09-29 ENCOUNTER — Other Ambulatory Visit: Payer: Self-pay | Admitting: Cardiology

## 2022-09-29 DIAGNOSIS — I493 Ventricular premature depolarization: Secondary | ICD-10-CM

## 2022-10-02 ENCOUNTER — Inpatient Hospital Stay: Admission: RE | Admit: 2022-10-02 | Payer: PPO | Source: Ambulatory Visit

## 2022-10-24 ENCOUNTER — Other Ambulatory Visit: Payer: Self-pay

## 2022-10-24 ENCOUNTER — Ambulatory Visit (HOSPITAL_COMMUNITY): Payer: PPO | Attending: Cardiology

## 2022-10-24 DIAGNOSIS — I429 Cardiomyopathy, unspecified: Secondary | ICD-10-CM | POA: Insufficient documentation

## 2022-10-24 DIAGNOSIS — Z951 Presence of aortocoronary bypass graft: Secondary | ICD-10-CM | POA: Diagnosis not present

## 2022-10-24 DIAGNOSIS — I493 Ventricular premature depolarization: Secondary | ICD-10-CM | POA: Diagnosis not present

## 2022-10-24 DIAGNOSIS — I083 Combined rheumatic disorders of mitral, aortic and tricuspid valves: Secondary | ICD-10-CM | POA: Diagnosis not present

## 2022-10-24 DIAGNOSIS — I2510 Atherosclerotic heart disease of native coronary artery without angina pectoris: Secondary | ICD-10-CM | POA: Insufficient documentation

## 2022-10-24 LAB — ECHOCARDIOGRAM COMPLETE
AV Vena cont: 0.3 cm
Area-P 1/2: 3.99 cm2
MV M vel: 4.13 m/s
MV Peak grad: 68.2 mmHg
P 1/2 time: 463 msec
Radius: 0.5 cm
S' Lateral: 3 cm

## 2022-11-01 ENCOUNTER — Telehealth: Payer: Self-pay | Admitting: Cardiology

## 2022-11-01 ENCOUNTER — Ambulatory Visit: Payer: PPO | Admitting: Cardiology

## 2022-11-01 NOTE — Telephone Encounter (Signed)
Spoke to the patient around 0300 this morning pt woke up with tempeture 101.0. Pt denies any other symptoms. Pt stated she still has a fever,  she tool some  tylenol around 0950. Encourage the patient to obtain fluids, if symptoms worsen to call PCP.  Also explained pt echocardiogram results, patient voiced understanding.

## 2022-11-01 NOTE — Progress Notes (Deleted)
Electrophysiology Office Note   Date:  11/01/2022   ID:  Carolyn Burns, DOB 02/15/1941, MRN [id number]  PCP:  Gweneth Dimitri, MD  Cardiologist:  Shari Prows Primary Electrophysiologist:  Rozelle Caudle Jorja Loa, MD    Chief Complaint: PVC   History of Present Illness: Carolyn Burns is a 82 y.o. female who is being seen today for the evaluation of PVC at the request of Perlie Gold, [place]. Presenting today for electrophysiology evaluation.  She has a history of significant coronary artery disease post CABG x 3, hypertension, hyperlipidemia, chronic systolic heart failure with improvement in her cardiac function post revascularization.  She wore a recent cardiac monitor with a 20% PVC burden.  Today, she denies*** symptoms of palpitations, chest pain, shortness of breath, orthopnea, PND, lower extremity edema, claudication, dizziness, presyncope, syncope, bleeding, or neurologic sequela. The patient is tolerating medications without difficulties.    Past Medical History:  Diagnosis Date   CAD (coronary artery disease)    a. 01/2016 NSTEMI/Cath: LM 75, LAD 70ost, LCX 50ost, 70p, LPDA 70, OM1 90;  b. 02/10/2016 CABG x 3 (LIMA->LAD, VG->OM1, VG->OM2).   Ischemic cardiomyopathy    a. 02/02/2016 Echo: EF 35-40% w/ mid-apicalanteroseptal and apical AK, Gr1 DD-->high suspicion for takotsubo;  b. 02/16/2016 Echo (post-cabg): EF 50-55%, midanteroseptal HK, Gr1 DD, mild MR, PASP , triv effusion.   Osteoarthritis    PVC's (premature ventricular contractions)    a. 02/2016 Freq pvc's noted post-op.   Past Surgical History:  Procedure Laterality Date   CARDIAC CATHETERIZATION N/A 02/01/2016   Procedure: Left Heart Cath and Coronary Angiography;  Surgeon: Lennette Bihari, MD;  Location: [facility] INVASIVE CV LAB;  Service: Cardiovascular;  Laterality: N/A;   CORONARY ARTERY BYPASS GRAFT N/A 02/10/2016   Procedure: CORONARY ARTERY BYPASS GRAFTING (CABG)x 3 with endoscopic harvesting of right  saphenous vein;  Surgeon: [name], MD;  Location: [facility] OR;  Service: Open Heart Surgery;  Laterality: N/A;   TEE WITHOUT CARDIOVERSION N/A 02/10/2016   Procedure: TRANSESOPHAGEAL ECHOCARDIOGRAM (TEE);  Surgeon: [name], MD;  Location: [facility] OR;  Service: Open Heart Surgery;  Laterality: N/A;     Current Outpatient Medications  Medication Sig Dispense Refill   aspirin EC 81 MG EC tablet Take 1 tablet (81 mg total) by mouth daily.     celecoxib (CELEBREX) 200 MG capsule Take 200 mg by mouth daily.     cholecalciferol (VITAMIN D) 1000 units tablet Take 1,000 Units by mouth daily.      estradiol (ESTRACE) 0.5 MG tablet Take 0.5 mg by mouth daily.     lisinopril (ZESTRIL) 5 MG tablet Take 1 tablet (5 mg total) by mouth daily. 90 tablet 3   progesterone (PROMETRIUM) 100 MG capsule Take 100 mg by mouth daily.     ranitidine (ZANTAC) 150 MG tablet Take 150 mg by mouth daily as needed for heartburn.     rosuvastatin (CRESTOR) 40 MG tablet Take 40 mg by mouth daily.     No current facility-administered medications for this visit.    Allergies:   Aleve [naproxen sodium] and Codeine   Social History:  The patient  reports that she has never smoked. She has never used smokeless tobacco. She reports that she does not drink alcohol and does not use drugs.   Family History:  The patient's family history is not on file. She was adopted.    ROS:  Please see the history of present illness.   Otherwise, review of systems is  positive for none.   All other systems are reviewed and negative.    PHYSICAL EXAM: VS:  There were no vitals taken for this visit. , BMI There is no height or weight on file to calculate BMI. GEN: Well nourished, well developed, in no acute distress  HEENT: normal  Neck: no JVD, carotid bruits, or masses Cardiac: ***RRR; no murmurs, rubs, or gallops,no edema  Respiratory:  clear to auscultation bilaterally, normal work of breathing GI: soft, nontender, nondistended, +  BS MS: no deformity or atrophy  Skin: warm and dry Neuro:  Strength and sensation are intact Psych: euthymic mood, full affect  EKG:  EKG {ACTION; IS/IS UJW:[id number]} ordered today. Personal review of the ekg ordered *** shows ***  Recent Labs: 09/13/2022: BUN 12; Creatinine, Ser 0.88; Magnesium 2.1; Potassium 4.4; Sodium 142    Lipid Panel     Component Value Date/Time   CHOL 124 02/25/2020 1016   TRIG 146 02/25/2020 1016   HDL 43 02/25/2020 1016   CHOLHDL 2.9 02/25/2020 1016   CHOLHDL 3.7 04/03/2016 1104   VLDL 20 04/03/2016 1104   LDLCALC 56 02/25/2020 1016     Wt Readings from Last 3 Encounters:  09/13/22 110 lb 3.2 oz (50 kg)  12/09/21 103 lb 6.4 oz (46.9 kg)  04/25/21 104 lb (47.2 kg)      Other studies Reviewed: Additional studies/ records that were reviewed today include: Cardiac monitor 09/28/2022 personally reviewed Review of the above records today demonstrates:    Predominant rhythm was normal sinus rhythm with average heart rate 75bpm and ranged from 52 to 169bpm.   Multiple episodes of nonsustained ventricular tachycardia lasting as long as 5 beats and fastest episode 169bpm.   Rare PACs, atrial couplets   Frequent PVCs, ventricular couplets and triplets.  Rare bigeminal and trigeminal PVCs.  PVC load 20.3%.  TTE 10/24/22  1. Left ventricular ejection fraction, by estimation, is 50 to 55%. Left  ventricular ejection fraction by 3D volume is 53 %. The left ventricle has  low normal function. The left ventricle has no regional wall motion  abnormalities. Left ventricular  diastolic parameters are indeterminate.   2. Right ventricular systolic function is normal. The right ventricular  size is normal. There is normal pulmonary artery systolic pressure. The  estimated right ventricular systolic pressure is 35.5 mmHg.   3. The mitral valve is normal in structure. Moderate mitral valve  regurgitation. No evidence of mitral stenosis.   4. Tricuspid valve  regurgitation is moderate.   5. The aortic valve is normal in structure. Aortic valve regurgitation is  moderate. No aortic stenosis is present.   6. The inferior vena cava is normal in size with greater than 50%  respiratory variability, suggesting right atrial pressure of 3 mmHg.   ASSESSMENT AND PLAN:  1.  PVCs: Elevated burden at 20%.***  2.  Hypertension:***  3.  Hyperlipidemia: Continue statin per primary cardiology  4.  Coronary artery disease: Status post CABG.  Plan per primary cardiology.    Current medicines are reviewed at length with the patient today.   The patient {ACTIONS; HAS/DOES NOT HAVE:19233} concerns regarding her medicines.  The following changes were made today:  {NONE DEFAULTED:18576}  Labs/ tests ordered today include: *** No orders of the defined types were placed in this encounter.    Disposition:   FU with Zafirah Vanzee {gen number [id number]} {Days to years:10300}  Signed, Kimanh Templeman Jorja Loa, MD  11/01/2022 8:25 AM     [facility] [address] [place] [address] [phone] (office) [phone] (fax)

## 2022-11-01 NOTE — Telephone Encounter (Signed)
Pt called in to get her consult w/ Camnitz r/s'd due to waking up this morning and not feeling well, states she has a fever and couldn't make her appt. Pts consult w/ Camnitz has been r/s'd to 5/31. Pt is very concerned about her echo. Pt initially wanted a c/b from Dr. Elberta Fortis, but advised the pt since she has yet to see him, he won't be able to call her. She then requested Perlie Gold PA to call her, as he is who she seen last, and referred her to see EP. She would like to know if the echo has been read and how serious is this. Pt states she was looking forward to seeing Dr. Elberta Fortis this morning to go over her results because of how anxious this has made her. She says if no one is able to call her, she is ok with a MyChart message.

## 2022-11-02 NOTE — Telephone Encounter (Signed)
Followed up with pt about fever reported yesterday. She reports she had a low grade fever for less than 24 hours, not run one since yesterday 6pm. Aware ok to keep appt tomorrow unless something changes. She appreciates the call and agreeable to plan.

## 2022-11-03 ENCOUNTER — Encounter: Payer: Self-pay | Admitting: Cardiology

## 2022-11-03 ENCOUNTER — Ambulatory Visit: Payer: PPO | Attending: Cardiology | Admitting: Cardiology

## 2022-11-03 VITALS — BP 128/72 | HR 81 | Ht 61.0 in | Wt 105.0 lb

## 2022-11-03 DIAGNOSIS — I2510 Atherosclerotic heart disease of native coronary artery without angina pectoris: Secondary | ICD-10-CM | POA: Diagnosis not present

## 2022-11-03 DIAGNOSIS — I493 Ventricular premature depolarization: Secondary | ICD-10-CM | POA: Diagnosis not present

## 2022-11-03 DIAGNOSIS — I10 Essential (primary) hypertension: Secondary | ICD-10-CM | POA: Diagnosis not present

## 2022-11-03 NOTE — Patient Instructions (Signed)
Medication Instructions:  Your physician recommends that you continue on your current medications as directed. Please refer to the Current Medication list given to you today.  *If you need a refill on your cardiac medications before your next appointment, please call your pharmacy*   Lab Work: None ordered   Testing/Procedures: Your physician has requested that you have an echocardiogram in 5 months. Echocardiography is a painless test that uses sound waves to create images of your heart. It provides your doctor with information about the size and shape of your heart and how well your heart's chambers and valves are working. This procedure takes approximately one hour. There are no restrictions for this procedure. Please do NOT wear cologne, perfume, aftershave, or lotions (deodorant is allowed). Please arrive 15 minutes prior to your appointment time.   Follow-Up: At [facility], you and your health needs are our priority.  As part of our continuing mission to provide you with exceptional heart care, we have created designated Provider Care Teams.  These Care Teams include your primary Cardiologist (physician) and Advanced Practice Providers (APPs -  Physician Assistants and Nurse Practitioners) who all work together to provide you with the care you need, when you need it.  Your next appointment:   6 month(s)  The format for your next appointment:   In Person  Provider:   Loman Brooklyn, MD    Thank you for choosing [facility]!!   Dory Horn, RN [phone]

## 2022-11-03 NOTE — Progress Notes (Signed)
Electrophysiology Office Note   Date:  11/03/2022   ID:  Carolyn Burns, DOB [birth year], MRN [id number]  PCP:  Gweneth Dimitri, MD  Cardiologist:  Shari Prows Primary Electrophysiologist:  Lateefa Crosby Jorja Loa, MD    Chief Complaint: PVC   History of Present Illness: Carolyn Burns is a 82 y.o. female who is being seen today for the evaluation of PVC at the request of Perlie Gold, [place]. Presenting today for electrophysiology evaluation.  She has a history significant for coronary artery disease post CABG, hypertension, hyperlipidemia, chronic systolic heart failure with improvement in ejection fraction post CABG. she was found to have an elevated PVC burden on cardiac monitor at 20%.  Currently she feels well.  She is not having any chest pain or shortness of breath.  She is able to all of her daily activities.  She does not have fatigue or weakness.  Today, she denies symptoms of palpitations, chest pain, shortness of breath, orthopnea, PND, lower extremity edema, claudication, dizziness, presyncope, syncope, bleeding, or neurologic sequela. The patient is tolerating medications without difficulties.    Past Medical History:  Diagnosis Date   CAD (coronary artery disease)    a. 01/2016 NSTEMI/Cath: LM 75, LAD 70ost, LCX 50ost, 70p, LPDA 70, OM1 90;  b. 02/10/2016 CABG x 3 (LIMA->LAD, VG->OM1, VG->OM2).   Ischemic cardiomyopathy    a. 02/02/2016 Echo: EF 35-40% w/ mid-apicalanteroseptal and apical AK, Gr1 DD-->high suspicion for takotsubo;  b. 02/16/2016 Echo (post-cabg): EF 50-55%, midanteroseptal HK, Gr1 DD, mild MR, PASP , triv effusion.   Osteoarthritis    PVC's (premature ventricular contractions)    a. 02/2016 Freq pvc's noted post-op.   Past Surgical History:  Procedure Laterality Date   CARDIAC CATHETERIZATION N/A 02/01/2016   Procedure: Left Heart Cath and Coronary Angiography;  Surgeon: Lennette Bihari, MD;  Location: [facility] INVASIVE CV LAB;  Service: Cardiovascular;   Laterality: N/A;   CORONARY ARTERY BYPASS GRAFT N/A 02/10/2016   Procedure: CORONARY ARTERY BYPASS GRAFTING (CABG)x 3 with endoscopic harvesting of right saphenous vein;  Surgeon: [name], MD;  Location: [facility] OR;  Service: Open Heart Surgery;  Laterality: N/A;   TEE WITHOUT CARDIOVERSION N/A 02/10/2016   Procedure: TRANSESOPHAGEAL ECHOCARDIOGRAM (TEE);  Surgeon: [name], MD;  Location: [facility] OR;  Service: Open Heart Surgery;  Laterality: N/A;     Current Outpatient Medications  Medication Sig Dispense Refill   aspirin EC 81 MG EC tablet Take 1 tablet (81 mg total) by mouth daily.     celecoxib (CELEBREX) 200 MG capsule Take 200 mg by mouth daily.     cholecalciferol (VITAMIN D) 1000 units tablet Take 1,000 Units by mouth daily.      estradiol (ESTRACE) 0.5 MG tablet Take 0.5 mg by mouth daily.     lisinopril (ZESTRIL) 5 MG tablet Take 1 tablet (5 mg total) by mouth daily. 90 tablet 3   progesterone (PROMETRIUM) 100 MG capsule Take 100 mg by mouth daily.     ranitidine (ZANTAC) 150 MG tablet Take 150 mg by mouth daily as needed for heartburn.     rosuvastatin (CRESTOR) 40 MG tablet Take 40 mg by mouth daily.     No current facility-administered medications for this visit.    Allergies:   Aleve [naproxen sodium] and Codeine   Social History:  The patient  reports that she has never smoked. She has never used smokeless tobacco. She reports that she does not drink alcohol and does not use drugs.  Family History:  The patient's family history is not on file. She was adopted.    ROS:  Please see the history of present illness.   Otherwise, review of systems is positive for none.   All other systems are reviewed and negative.    PHYSICAL EXAM: VS:  BP 128/72   Pulse 81   Ht 5\' 1"  (1.549 m)   Wt 105 lb (47.6 kg)   SpO2 99%   BMI 19.84 kg/m  , BMI Body mass index is 19.84 kg/m. GEN: Well nourished, well developed, in no acute distress  HEENT: normal  Neck: no JVD, carotid  bruits, or masses Cardiac: RRR; no murmurs, rubs, or gallops,no edema  Respiratory:  clear to auscultation bilaterally, normal work of breathing GI: soft, nontender, nondistended, + BS MS: no deformity or atrophy  Skin: warm and dry Neuro:  Strength and sensation are intact Psych: euthymic mood, full affect  EKG:  EKG is ordered today. Personal review of the ekg ordered shows sinus rhythm, PVCs  Recent Labs: 09/13/2022: BUN 12; Creatinine, Ser 0.88; Magnesium 2.1; Potassium 4.4; Sodium 142    Lipid Panel     Component Value Date/Time   CHOL 124 02/25/2020 1016   TRIG 146 02/25/2020 1016   HDL 43 02/25/2020 1016   CHOLHDL 2.9 02/25/2020 1016   CHOLHDL 3.7 04/03/2016 1104   VLDL 20 04/03/2016 1104   LDLCALC 56 02/25/2020 1016     Wt Readings from Last 3 Encounters:  11/03/22 105 lb (47.6 kg)  09/13/22 110 lb 3.2 oz (50 kg)  12/09/21 103 lb 6.4 oz (46.9 kg)      Other studies Reviewed: Additional studies/ records that were reviewed today include: TTE 10/24/22  Review of the above records today demonstrates:   1. Left ventricular ejection fraction, by estimation, is 50 to 55%. Left  ventricular ejection fraction by 3D volume is 53 %. The left ventricle has  low normal function. The left ventricle has no regional wall motion  abnormalities. Left ventricular  diastolic parameters are indeterminate.   2. Right ventricular systolic function is normal. The right ventricular  size is normal. There is normal pulmonary artery systolic pressure. The  estimated right ventricular systolic pressure is 35.5 mmHg.   3. The mitral valve is normal in structure. Moderate mitral valve  regurgitation. No evidence of mitral stenosis.   4. Tricuspid valve regurgitation is moderate.   5. The aortic valve is normal in structure. Aortic valve regurgitation is  moderate. No aortic stenosis is present.   6. The inferior vena cava is normal in size with greater than 50%  respiratory variability,  suggesting right atrial pressure of 3 mmHg.   Cardiac monitor 09/28/22 personally reviewed   Predominant rhythm was normal sinus rhythm with average heart rate 75bpm and ranged from 52 to 169bpm.   Multiple episodes of nonsustained ventricular tachycardia lasting as long as 5 beats and fastest episode 169bpm.   Rare PACs, atrial couplets   Frequent PVCs, ventricular couplets and triplets.  Rare bigeminal and trigeminal PVCs.  PVC load 20.3%.  ASSESSMENT AND PLAN:  1.  Coronary disease: Status post CABG.  No current chest pain.  Continue plan per primary cardiology.  2.  PVCs: Elevated burden at 20%.  She feels well without symptoms.  She has no chest pain or shortness of breath.  Fortunately her ejection fraction is also remained normal.  I have discussed with her antiarrhythmics versus watching and waiting.  At this point, she would  prefer to avoid further medication management.  Jediah Horger see her back in 6 months with an echo at that time.  If her ejection fraction changes, Keyshon Stein need antiarrhythmic for suppression.  3.  Hypertension: Currently well-controlled  3.  Hyperlipidemia: Continue statin per primary cardiology   Current medicines are reviewed at length with the patient today.   The patient does not have concerns regarding her medicines.  The following changes were made today:  none  Labs/ tests ordered today include:  Orders Placed This Encounter  Procedures   EKG 12-Lead   ECHOCARDIOGRAM COMPLETE     Disposition:   FU with Joaquina Nissen 6 months  Signed, Lakendrick Paradis Jorja Loa, MD  11/03/2022 8:56 AM     [facility] [address] [place] [address] [phone] (office) [phone] (fax)

## 2022-11-06 DIAGNOSIS — Z6821 Body mass index (BMI) 21.0-21.9, adult: Secondary | ICD-10-CM | POA: Diagnosis not present

## 2022-11-06 DIAGNOSIS — N390 Urinary tract infection, site not specified: Secondary | ICD-10-CM | POA: Diagnosis not present

## 2022-11-06 DIAGNOSIS — R350 Frequency of micturition: Secondary | ICD-10-CM | POA: Diagnosis not present

## 2022-11-15 ENCOUNTER — Ambulatory Visit: Payer: PPO | Admitting: Cardiology

## 2022-12-05 DIAGNOSIS — Z79899 Other long term (current) drug therapy: Secondary | ICD-10-CM | POA: Diagnosis not present

## 2023-02-09 DIAGNOSIS — R3915 Urgency of urination: Secondary | ICD-10-CM | POA: Diagnosis not present

## 2023-02-09 DIAGNOSIS — R310 Gross hematuria: Secondary | ICD-10-CM | POA: Diagnosis not present

## 2023-03-12 ENCOUNTER — Other Ambulatory Visit (HOSPITAL_COMMUNITY): Payer: PPO

## 2023-03-26 ENCOUNTER — Ambulatory Visit (HOSPITAL_COMMUNITY): Payer: PPO | Attending: Cardiology

## 2023-03-26 DIAGNOSIS — R001 Bradycardia, unspecified: Secondary | ICD-10-CM | POA: Diagnosis not present

## 2023-03-26 DIAGNOSIS — I252 Old myocardial infarction: Secondary | ICD-10-CM | POA: Insufficient documentation

## 2023-03-26 DIAGNOSIS — I083 Combined rheumatic disorders of mitral, aortic and tricuspid valves: Secondary | ICD-10-CM | POA: Insufficient documentation

## 2023-03-26 DIAGNOSIS — I110 Hypertensive heart disease with heart failure: Secondary | ICD-10-CM | POA: Insufficient documentation

## 2023-03-26 DIAGNOSIS — I509 Heart failure, unspecified: Secondary | ICD-10-CM | POA: Insufficient documentation

## 2023-03-26 DIAGNOSIS — I493 Ventricular premature depolarization: Secondary | ICD-10-CM | POA: Insufficient documentation

## 2023-03-26 DIAGNOSIS — I255 Ischemic cardiomyopathy: Secondary | ICD-10-CM | POA: Insufficient documentation

## 2023-03-26 DIAGNOSIS — E785 Hyperlipidemia, unspecified: Secondary | ICD-10-CM | POA: Diagnosis not present

## 2023-03-26 DIAGNOSIS — I10 Essential (primary) hypertension: Secondary | ICD-10-CM | POA: Diagnosis not present

## 2023-03-26 DIAGNOSIS — I2510 Atherosclerotic heart disease of native coronary artery without angina pectoris: Secondary | ICD-10-CM | POA: Diagnosis not present

## 2023-03-26 LAB — ECHOCARDIOGRAM COMPLETE
Area-P 1/2: 4.31 cm2
P 1/2 time: 444 ms
S' Lateral: 3.2 cm

## 2023-03-28 ENCOUNTER — Ambulatory Visit: Payer: PPO | Admitting: Cardiology

## 2023-04-05 DIAGNOSIS — Z79899 Other long term (current) drug therapy: Secondary | ICD-10-CM | POA: Diagnosis not present

## 2023-04-05 DIAGNOSIS — R7301 Impaired fasting glucose: Secondary | ICD-10-CM | POA: Diagnosis not present

## 2023-04-05 DIAGNOSIS — I2510 Atherosclerotic heart disease of native coronary artery without angina pectoris: Secondary | ICD-10-CM | POA: Diagnosis not present

## 2023-04-11 ENCOUNTER — Ambulatory Visit: Payer: PPO | Admitting: Student

## 2023-04-26 NOTE — Progress Notes (Signed)
  Electrophysiology Office Note:   Date:  04/27/2023  ID:  Carolyn Burns, DOB [birth year], MRN [id number]  Primary Cardiologist: Meriam Sprague, MD (Inactive) Electrophysiologist: Will Jorja Loa, MD      History of Present Illness:   Carolyn Burns is a 82 y.o. female with h/o PVCs, CAD s/p CABG, HTN, HLD, and HFrEF seen today for routine electrophysiology followup.   Since last being seen in our clinic the patient reports doing great. Currentl,  she denies chest pain, palpitations, dyspnea, PND, orthopnea, nausea, vomiting, dizziness, syncope, edema, weight gain, or early satiety.   Review of systems complete and found to be negative unless listed in HPI.   EP Information / Studies Reviewed:    EKG is ordered today. Personal review as below.  EKG Interpretation Date/Time:  Friday April 27 2023 08:15:22 EST Ventricular Rate:  62 PR Interval:  150 QRS Duration:  78 QT Interval:  488 QTC Calculation: 495 R Axis:   43  Text Interpretation: Sinus rhythm with occasional Premature ventricular complexes Nonspecific T wave abnormality Prolonged QT When compared with ECG of 11-Feb-2016 06:51, Premature ventricular complexes are now Present T wave inversion less evident in Anterolateral leads Confirmed by Maxine Glenn [phone]) on 04/27/2023 8:22:22 AM    Echo 03/26/2023 LVEF 50-55%, Grade 1 DD, normal RV, mild/mod MR  Physical Exam:   VS:  BP (!) 140/68 (BP Location: Left Arm, Patient Position: Sitting, Cuff Size: Normal)   Pulse 62   Ht 5\' 1"  (1.549 m)   Wt 111 lb (50.3 kg)   SpO2 96%   BMI 20.97 kg/m    Wt Readings from Last 3 Encounters:  04/27/23 111 lb (50.3 kg)  11/03/22 105 lb (47.6 kg)  09/13/22 110 lb 3.2 oz (50 kg)     GEN: Well nourished, well developed in no acute distress NECK: No JVD; No carotid bruits CARDIAC: Regular rate and rhythm with occasional ectopy, no murmurs, rubs, gallops RESPIRATORY:  Clear to auscultation without rales,  wheezing or rhonchi  ABDOMEN: Soft, non-tender, non-distended EXTREMITIES:  No edema; No deformity   ASSESSMENT AND PLAN:    PVCs EF remains normal despite 20% burden As long as asymptomatic, will continue watchful waiting  CAD s/p CABG Denies s/s ischemia  HTN Stable on current regimen   HLD Continue statin      Follow up with Dr. Elberta Fortis in 6 months  Signed, Graciella Freer, PA-C

## 2023-04-27 ENCOUNTER — Ambulatory Visit: Payer: PPO | Attending: Student | Admitting: Student

## 2023-04-27 ENCOUNTER — Encounter: Payer: Self-pay | Admitting: Student

## 2023-04-27 VITALS — BP 140/68 | HR 62 | Ht 61.0 in | Wt 111.0 lb

## 2023-04-27 DIAGNOSIS — E785 Hyperlipidemia, unspecified: Secondary | ICD-10-CM

## 2023-04-27 DIAGNOSIS — I2510 Atherosclerotic heart disease of native coronary artery without angina pectoris: Secondary | ICD-10-CM

## 2023-04-27 DIAGNOSIS — I493 Ventricular premature depolarization: Secondary | ICD-10-CM

## 2023-04-27 DIAGNOSIS — I10 Essential (primary) hypertension: Secondary | ICD-10-CM | POA: Diagnosis not present

## 2023-04-27 DIAGNOSIS — I214 Non-ST elevation (NSTEMI) myocardial infarction: Secondary | ICD-10-CM | POA: Diagnosis not present

## 2023-04-27 NOTE — Patient Instructions (Signed)
Medication Instructions:  Your physician recommends that you continue on your current medications as directed. Please refer to the Current Medication list given to you today.  *If you need a refill on your cardiac medications before your next appointment, please call your pharmacy*  Lab Work: None ordered If you have labs (blood work) drawn today and your tests are completely normal, you will receive your results only by: MyChart Message (if you have MyChart) OR A paper copy in the mail If you have any lab test that is abnormal or we need to change your treatment, we will call you to review the results.  Follow-Up: At [facility], you and your health needs are our priority.  As part of our continuing mission to provide you with exceptional heart care, we have created designated Provider Care Teams.  These Care Teams include your primary Cardiologist (physician) and Advanced Practice Providers (APPs -  Physician Assistants and Nurse Practitioners) who all work together to provide you with the care you need, when you need it.  Your next appointment:   6 month(s)  Provider:   Loman Brooklyn, MD

## 2023-05-14 DIAGNOSIS — L309 Dermatitis, unspecified: Secondary | ICD-10-CM | POA: Diagnosis not present

## 2023-06-04 DIAGNOSIS — Z79899 Other long term (current) drug therapy: Secondary | ICD-10-CM | POA: Diagnosis not present

## 2023-06-04 DIAGNOSIS — I2510 Atherosclerotic heart disease of native coronary artery without angina pectoris: Secondary | ICD-10-CM | POA: Diagnosis not present

## 2023-06-04 DIAGNOSIS — Z23 Encounter for immunization: Secondary | ICD-10-CM | POA: Diagnosis not present

## 2023-06-04 DIAGNOSIS — E785 Hyperlipidemia, unspecified: Secondary | ICD-10-CM | POA: Diagnosis not present

## 2023-06-04 DIAGNOSIS — Z0000 Encounter for general adult medical examination without abnormal findings: Secondary | ICD-10-CM | POA: Diagnosis not present

## 2023-06-04 DIAGNOSIS — I10 Essential (primary) hypertension: Secondary | ICD-10-CM | POA: Diagnosis not present

## 2023-06-04 DIAGNOSIS — M545 Low back pain, unspecified: Secondary | ICD-10-CM | POA: Diagnosis not present

## 2023-06-04 DIAGNOSIS — R7303 Prediabetes: Secondary | ICD-10-CM | POA: Diagnosis not present

## 2023-06-04 DIAGNOSIS — K219 Gastro-esophageal reflux disease without esophagitis: Secondary | ICD-10-CM | POA: Diagnosis not present

## 2023-06-04 DIAGNOSIS — N951 Menopausal and female climacteric states: Secondary | ICD-10-CM | POA: Diagnosis not present

## 2023-06-04 DIAGNOSIS — Z1331 Encounter for screening for depression: Secondary | ICD-10-CM | POA: Diagnosis not present

## 2023-06-04 DIAGNOSIS — R21 Rash and other nonspecific skin eruption: Secondary | ICD-10-CM | POA: Diagnosis not present

## 2023-08-23 DIAGNOSIS — L218 Other seborrheic dermatitis: Secondary | ICD-10-CM | POA: Diagnosis not present

## 2023-08-23 DIAGNOSIS — L255 Unspecified contact dermatitis due to plants, except food: Secondary | ICD-10-CM | POA: Diagnosis not present

## 2023-08-24 DIAGNOSIS — H5201 Hypermetropia, right eye: Secondary | ICD-10-CM | POA: Diagnosis not present

## 2023-08-24 DIAGNOSIS — H2522 Age-related cataract, morgagnian type, left eye: Secondary | ICD-10-CM | POA: Diagnosis not present

## 2023-08-24 DIAGNOSIS — H2511 Age-related nuclear cataract, right eye: Secondary | ICD-10-CM | POA: Diagnosis not present

## 2023-09-27 DIAGNOSIS — H21562 Pupillary abnormality, left eye: Secondary | ICD-10-CM | POA: Diagnosis not present

## 2023-09-27 DIAGNOSIS — H2522 Age-related cataract, morgagnian type, left eye: Secondary | ICD-10-CM | POA: Diagnosis not present

## 2023-09-27 DIAGNOSIS — H25812 Combined forms of age-related cataract, left eye: Secondary | ICD-10-CM | POA: Diagnosis not present

## 2023-09-27 DIAGNOSIS — H268 Other specified cataract: Secondary | ICD-10-CM | POA: Diagnosis not present

## 2023-09-27 DIAGNOSIS — Z961 Presence of intraocular lens: Secondary | ICD-10-CM | POA: Diagnosis not present

## 2023-10-15 DIAGNOSIS — L258 Unspecified contact dermatitis due to other agents: Secondary | ICD-10-CM | POA: Diagnosis not present

## 2023-10-18 DIAGNOSIS — L258 Unspecified contact dermatitis due to other agents: Secondary | ICD-10-CM | POA: Diagnosis not present

## 2023-10-22 NOTE — Progress Notes (Signed)
 Cardiology Office Note:  .   Date:  10/22/2023  ID:  Carolyn Burns, DOB 11-Apr-1941, MRN [id number] PCP: Helyn Lobstein, MD  [facility] Providers Cardiologist:  Sonny Dust, MD (Inactive) Electrophysiologist:  Will Cortland Ding, MD {  History of Present Illness: .   Carolyn Burns is a 83 y.o. female w/PMHx of  CAD (CABG 2017), HTN, HLD CHF w/recovered LVEF post revascularization PVCs  She saw Dr. Lawana Pray May 2024, noting 20% PVC burden. No anginal symptoms, or particular awareness of ectopy. Discussed ADD management she preferred a watch and wait strategy wanting to try and avoid more medicines. 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 Left ventricular ejection fraction, by estimation, is 50 to 55%. Left  ventricular ejection fraction by 3D volume is 53 %. The left ventricle has  low normal function. The left ventricle has no regional wall motion  abnormalities. Left ventricular  diastolic parameters are indeterminate.   2. Right ventricular systolic function is normal. The right ventricular  size is normal. There is normal pulmonary artery systolic pressure. The  estimated right ventricular systolic  pressure is 35.5 mmHg.   3. The mitral valve is normal in structure. Moderate mitral valve  regurgitation. No evidence of mitral stenosis.   4. Tricuspid valve regurgitation is moderate.   5. The aortic valve is normal in structure. Aortic valve regurgitation is  moderate. No aortic stenosis is present.   6. The inferior vena cava is normal in size with greater than 50%  respiratory variability, suggesting right atrial pressure of 3 mmHg.    Cardiac monitor 09/28/22    Predominant rhythm was normal sinus rhythm with average heart rate 75bpm and ranged from 52 to 169bpm.   Multiple episodes of nonsustained ventricular tachycardia lasting as long as 5 beats and fastest episode 169bpm.   Rare PACs, atrial couplets   Frequent PVCs, ventricular couplets and triplets.  Rare bigeminal and trigeminal PVCs.  PVC load 20.3%.   Risk Assessment/Calculations:    Physical Exam:   VS:  There were no vitals taken for this visit.   Wt Readings from Last 3 Encounters:  04/27/23 111 lb (50.3 kg)  11/03/22 105 lb (47.6 kg)  09/13/22 110 lb 3.2 oz (50 kg)    GEN: Well nourished, well developed in no acute distress NECK: No JVD; No carotid bruits CARDIAC: RRR, one extrasystole with prolonged auscultation, no murmurs, rubs, gallops RESPIRATORY:   CTA b/l without rales, wheezing or rhonchi  ABDOMEN: Soft, non-tender, non-distended EXTREMITIES:  No edema; No deformity   ASSESSMENT AND PLAN: .    PVCs None today on her  EKG/rhythm strip Only one extrasystole on a prlonged auscultation today  CAD HTN No symptoms of angina   Due/over due for her cardiology team. Needs a new cardiologist, though has been seeing APPs She was very happy with Barrett Borders and would like a female cardiologist of possible > plan for [facility]/Dr. Veryl Gottron team   Dispo: I think she can see EP PRN > will get her back with cardiology team  Signed, Debbie Fails, PA-C

## 2023-10-24 ENCOUNTER — Ambulatory Visit: Attending: Physician Assistant | Admitting: Physician Assistant

## 2023-10-24 ENCOUNTER — Encounter: Payer: Self-pay | Admitting: Physician Assistant

## 2023-10-24 VITALS — BP 122/74 | HR 60 | Ht 61.0 in | Wt 111.2 lb

## 2023-10-24 DIAGNOSIS — I25810 Atherosclerosis of coronary artery bypass graft(s) without angina pectoris: Secondary | ICD-10-CM | POA: Diagnosis not present

## 2023-10-24 DIAGNOSIS — I493 Ventricular premature depolarization: Secondary | ICD-10-CM | POA: Diagnosis not present

## 2023-10-24 DIAGNOSIS — I10 Essential (primary) hypertension: Secondary | ICD-10-CM

## 2023-10-24 NOTE — Patient Instructions (Addendum)
 Medication Instructions:   Your physician recommends that you continue on your current medications as directed. Please refer to the Current Medication list given to you today.   *If you need a refill on your cardiac medications before your next appointment, please call your pharmacy*   Lab Work: NONE ORDERED  TODAY    If you have labs (blood work) drawn today and your tests are completely normal, you will receive your results only by: MyChart Message (if you have MyChart) OR A paper copy in the mail If you have any lab test that is abnormal or we need to change your treatment, we will call you to review the results.    Testing/Procedures: NONE ORDERED  TODAY    Follow-Up: At [facility], you and your health needs are our priority.  As part of our continuing mission to provide you with exceptional heart care, our providers are all part of one team.  This team includes your primary Cardiologist (physician) and Advanced Practice Providers or APPs (Physician Assistants and Nurse Practitioners) who all work together to provide you with the care you need, when you need it.    Your next appointment:  WITH EP DEPARTMENT : CONTACT [facility] [phone] AS NEEDED FOR  ANY CARDIAC RELATED SYMPTOMS   NEXT AVAILABLE WITH  DR Veryl Gottron Allene Ar[employer] APP [employer]     We recommend signing up for the patient portal called "MyChart".  Sign up information is provided on this After Visit Summary.  MyChart is used to connect with patients for Virtual Visits (Telemedicine).  Patients are able to view lab/test results, encounter notes, upcoming appointments, etc.  Non-urgent messages can be sent to your provider as well.   To learn more about what you can do with MyChart, go to [email or web address].   Other Instructions

## 2023-11-01 DIAGNOSIS — H2511 Age-related nuclear cataract, right eye: Secondary | ICD-10-CM | POA: Diagnosis not present

## 2023-11-01 DIAGNOSIS — H25811 Combined forms of age-related cataract, right eye: Secondary | ICD-10-CM | POA: Diagnosis not present

## 2023-11-01 DIAGNOSIS — Z961 Presence of intraocular lens: Secondary | ICD-10-CM | POA: Diagnosis not present

## 2023-11-29 DIAGNOSIS — L258 Unspecified contact dermatitis due to other agents: Secondary | ICD-10-CM | POA: Diagnosis not present

## 2024-01-28 NOTE — Progress Notes (Unsigned)
 Cardiology Office Note   Date:  01/29/2024  ID:  [name], [name] [birth year], MRN [id number] PCP: Aisha Harvey, MD  [facility] Providers Cardiologist:  None Cardiology APP:  Vannie Reche RAMAN, NP  Electrophysiologist:  Soyla Gladis Norton, MD     History of Present Illness Carolyn Burns is a 83 y.o. female with hx of CAD s/p CABG x3 2017, HTN, HLD, PVC, carotid stenosis  Her CAD dates back to NSTEMI 01/2016 followed by CABGx3 (LIMA-LAD, SVG-OM1, SVG-OM2). She had ischemic cardiomyopathy with previous EF 35-40% in 01/2016. LVEF improved post surgical revascularization with repeat echo LVEF 50-55%, mid anterior septal hypokinesis, grade 1 diastolic dysfunction, mild MR. Previously required down titration of Metoprolol  due to bradycardia.  Seen by EP 10/24/23 with no palpitations, no PVC by EKG. She was recommended to follow up as needed with EP.  She presents today for follow up. 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 Denies chest pain, dyspnea, N/V, PND, orthopnea, weight gain; endorses stable, non-lifestyle-limiting fatigue. On lisinopril  5mg  QD, increased from 2.5mg  in 03/2023; home BPs 160s-170s/80s; does report hoarseness on ACE-I. Denies palpitations, fluttering, skipped beats. Denies symptoms of headache, vision changes, lightheadedness/dizziness  ROS: Please see the history of present illness.    All other systems reviewed and are negative.   Studies Reviewed      Cardiac Studies & Procedures   ______________________________________________________________________________________________ CARDIAC CATHETERIZATION  CARDIAC CATHETERIZATION 02/01/2016  Conclusion  LM lesion, 75 %stenosed.  Ost LAD lesion, 70 %stenosed.  Prox LAD to Mid LAD lesion, 80 %stenosed.  LPDA lesion, 70 %stenosed.  Ost Cx to Prox Cx lesion, 50 %stenosed.  Prox Cx lesion, 70 %stenosed.  1st Mrg lesion, 90 %stenosed.  There  is severe left ventricular systolic dysfunction.  LV end diastolic pressure is moderately elevated.  Severe two-vessel coronary artery disease with separate ostium to the LAD and circumflex vessel with ostial 70% diffuse LAD stenosis followed by 70%and 80% proximal to mid stenoses; 50 and 70% proximal circumflex stenosis with 90% stenosis in a large OM1 vessel and 70% distal stenosis in the PLA branch of the dominant circumflex vessel, and small nondominant RCA.  Severe LV dysfunction with significant hypo-to akinesis involving the mid distal anterolateral wall, apex and mid distal inferior wall.  Ejection fraction is 25%.  RECOMMENDATION: Surgical consultation for consideration of CABG revascularization surgery.  Findings Coronary Findings Diagnostic  Dominance: Left  Left Main There appear to be a separate ostium to the LAD and left circumflex vessel.  Left Anterior Descending The LAD was diffusely diseased.  There was 70% ostial stenosis followed by 70% proximal and 80% proximal to mid stenoses in the region of the first diagonal takeoff.  Left Circumflex The circumflex seemed to have a separate ostium from the LAD.  There was 50% followed by 70% proximal stenoses.  The circumflex marginal 1 vessel was large caliber and had 90% focal stenosis.  The distal PLA branch of the dominant circumflex had 70% stenosis.  First Obtuse Marginal Branch  Left Posterior Descending Artery  Right Coronary Artery The RCA was a very small nondominant vessel that gave rise to several small proximal branches.  Intervention  No interventions have been documented.     ECHOCARDIOGRAM  ECHOCARDIOGRAM COMPLETE 03/26/2023  Narrative ECHOCARDIOGRAM REPORT    Patient Name:   [name] Date of Exam: 03/26/2023 Medical Rec #:  [id number]  Height:       61.0 in Accession #:    [id number]          Weight:       105.0 lb Date of Birth:  [birth year]           BSA:          1.436  m Patient Age:    82 years            BP:           128/72 mmHg Patient Gender: F                   HR:           65 bpm. Exam Location:  [address]  Procedure: 2D Echo, Cardiac Doppler and Color Doppler  Indications:    I49.3 PVC  History:        Patient has prior history of Echocardiogram examinations, most recent 10/24/2022. CHF, CAD and Previous Myocardial Infarction, Prior CABG; Risk Factors:Hypertension and Dyslipidemia. Ischemic cardiomyopathy. Bradycardia.  Sonographer:    Jon Hacker RCS Referring Phys: [id number] WILL MARTIN CAMNITZ  IMPRESSIONS   1. Left ventricular ejection fraction, by estimation, is 50 to 55%. The left ventricle has low normal function. The left ventricle has no regional wall motion abnormalities. Left ventricular diastolic parameters are consistent with Grade I diastolic dysfunction (impaired relaxation). 2. Right ventricular systolic function is normal. The right ventricular size is normal. There is mildly elevated pulmonary artery systolic pressure. The estimated right ventricular systolic pressure is 40.1 mmHg. 3. The mitral valve is normal in structure. Mild to moderate mitral valve regurgitation. No evidence of mitral stenosis. 4. Tricuspid valve regurgitation is moderate. 5. The aortic valve is normal in structure. There is mild calcification of the aortic valve. Aortic valve regurgitation is moderate. No aortic stenosis is present.  FINDINGS Left Ventricle: Left ventricular ejection fraction, by estimation, is 50 to 55%. The left ventricle has low normal function. The left ventricle has no regional wall motion abnormalities. The left ventricular internal cavity size was normal in size. There is borderline left ventricular hypertrophy. Left ventricular diastolic parameters are consistent with Grade I diastolic dysfunction (impaired relaxation).  Right Ventricle: The right ventricular size is normal. No increase in right ventricular wall  thickness. Right ventricular systolic function is normal. There is mildly elevated pulmonary artery systolic pressure. The tricuspid regurgitant velocity is 2.92 m/s, and with an assumed right atrial pressure of 6 mmHg, the estimated right ventricular systolic pressure is 40.1 mmHg.  Left Atrium: Left atrial size was normal in size.  Right Atrium: Right atrial size was normal in size.  Pericardium: There is no evidence of pericardial effusion.  Mitral Valve: The mitral valve is normal in structure. Mild to moderate mitral valve regurgitation. No evidence of mitral valve stenosis.  Tricuspid Valve: The tricuspid valve is normal in structure. Tricuspid valve regurgitation is moderate . No evidence of tricuspid stenosis.  Aortic Valve: The aortic valve is normal in structure. There is mild calcification of the aortic valve. Aortic valve regurgitation is moderate. Aortic regurgitation PHT measures 444 msec. No aortic stenosis is present.  Pulmonic Valve: The pulmonic valve was normal in structure. Pulmonic valve regurgitation is not visualized. No evidence of pulmonic stenosis.  Aorta: The aortic root is normal in size and structure.  Venous: The inferior vena cava was not well visualized.  IAS/Shunts: No atrial level shunt detected by color flow Doppler.   LEFT VENTRICLE PLAX  2D LVIDd:         4.20 cm   Diastology LVIDs:         3.20 cm   LV e' medial:    8.05 cm/s LV PW:         1.10 cm   LV E/e' medial:  13.2 LV IVS:        1.00 cm   LV e' lateral:   11.30 cm/s LVOT diam:     1.90 cm   LV E/e' lateral: 9.4 LV SV:         52 LV SV Index:   36 LVOT Area:     2.84 cm   RIGHT VENTRICLE RV Basal diam:  2.40 cm RV S prime:     8.05 cm/s TAPSE (M-mode): 1.4 cm RVSP:           37.1 mmHg  LEFT ATRIUM             Index        RIGHT ATRIUM           Index LA diam:        3.20 cm 2.23 cm/m   RA Pressure: 3.00 mmHg LA Vol (A2C):   23.2 ml 16.15 ml/m  RA Area:     8.81 cm LA Vol  (A4C):   34.3 ml 23.88 ml/m  RA Volume:   14.60 ml  10.17 ml/m LA Biplane Vol: 29.0 ml 20.19 ml/m AORTIC VALVE LVOT Vmax:   70.60 cm/s LVOT Vmean:  44.500 cm/s LVOT VTI:    0.183 m AI PHT:      444 msec  AORTA Ao Root diam: 2.60 cm Ao Asc diam:  3.40 cm  MITRAL VALVE                TRICUSPID VALVE MV Area (PHT): 4.31 cm     TR Peak grad:   34.1 mmHg MV Decel Time: 176 msec     TR Vmax:        292.00 cm/s MV E velocity: 106.00 cm/s  Estimated RAP:  3.00 mmHg MV A velocity: 108.00 cm/s  RVSP:           37.1 mmHg MV E/A ratio:  0.98 SHUNTS Systemic VTI:  0.18 m Systemic Diam: 1.90 cm  Aditya Sabharwal Electronically signed by Ria Commander Signature Date/Time: 03/26/2023/10:45:34 AM    Final   TEE  ECHO TEE 02/10/2016  Narrative *[place]* *[facility]* [address] [place], [place] [address] [phone]  ------------------------------------------------------------------- Intraoperative Transesophageal Echocardiography  Patient:    Terry, Abila MR #:       [id number] Study Date: 02/10/2016 Gender:     F Age:        75 Height:     154.9 cm Weight:     51.1 kg BSA:        1.49 m^2 Pt. Status: Room:       2S03C  ATTENDING    Obadiah Maude TISA Obadiah Maude BARTON Obadiah Maude SONOGRAPHER  Jon Hacker PERFORMING   Franky Bald, MD ADMITTING    [place] [facility]  cc:  ------------------------------------------------------------------- LV EF: 50% -  ------------------------------------------------------------------- Study Conclusions  - Left ventricle: The cavity size was normal. Wall thickness was normal. The estimated ejection fraction was = 50%. There was no dynamic obstruction. Mild diffuse hypokinesis with distinct regional wall motion abnormalities. Hypokinesis of the inferoseptal and apical myocardium; consistent with ischemia. -  Aortic valve: There was trivial regurgitation directed  centrally in the LVOT. There was no significant perivalvular regurgitation. - Atrial septum: No defect or patent foramen ovale was identified.  Intraoperative transesophageal echocardiography.  Birthdate: Patient birthdate: 11-15-1940.  Age:  Patient is 83 yr old.  Sex: Gender: female.    BMI: 21.3 kg/m^2.  Study date:  Study date: 02/10/2016. Study time: 07:53 AM.  -------------------------------------------------------------------  ------------------------------------------------------------------- Left ventricle:  Well visualized. The cavity size was normal. Wall thickness was normal. There was no hypertrophy. The estimated ejection fraction was = 50%. There was no dynamic obstruction. Mild diffuse hypokinesis with distinct regional wall motion abnormalities.  Regional wall motion abnormalities:   Hypokinesis of the inferoseptal and apical myocardium; consistent with ischemia.  ------------------------------------------------------------------- Aortic valve:  Well visualized.  Normal-sized annulus. Structurally normal valve. The valve appears to be grossly normal. Trileaflet; normal thickness leaflets. Cusp separation was normal.  Doppler: Transvalvular velocity was within the normal range. There was no stenosis. There was trivial regurgitation directed centrally in the LVOT. There was no significant perivalvular regurgitation.  ------------------------------------------------------------------- Aorta:   The arch was left-sided.   The aorta was well visualized, not dilated, non-tortuous, trivially calcified, and trivially diseased.  ------------------------------------------------------------------- Mitral valve:  Well visualized.  Normal-sized annulus. Structurally normal valve. The valve appears to be grossly normal. Leaflet separation was normal. There was no systolic anterior motion. Doppler:  Transvalvular velocity was within the normal range. There was no evidence for  stenosis. There was trivial regurgitation.  ------------------------------------------------------------------- Left atrium:  Well visualized. The atrium was normal in size.  ------------------------------------------------------------------- Atrial septum:  Well visualized. The septum was normal. No defect or patent foramen ovale was identified.  ------------------------------------------------------------------- Right ventricle:  Well visualized. The cavity size was normal. Wall thickness was normal. There was no evidence of concentric hypertrophy. Systolic function was normal. There were no regional wall motion abnormalities.  ------------------------------------------------------------------- Pulmonic valve:   Well visualized.  Normal-sized annulus. Structurally normal valve. The valve appears to be grossly normal. Cusp separation was normal.  Doppler:  Transvalvular velocity was within the normal range. There was no significant regurgitation.  ------------------------------------------------------------------- Tricuspid valve:  Well visualized.  Normal-sized annulus. Structurally normal valve. The valve appears to be grossly normal. Leaflet separation was normal.  Doppler:  There was trivial regurgitation directed centrally.  ------------------------------------------------------------------- Right atrium:  Well visualized. The atrium was normal in size.  ------------------------------------------------------------------- Pericardium:  The pericardium was normal in appearance. There was no pericardial effusion.  ------------------------------------------------------------------- Post bypass:  - LV size was normal. - The estimated LV ejection fraction was 50-55%. - Hypokinesis of the apical septal LV myocardium, improved from the prior stage and improved from baseline.  Aortic valve: stenosis; unchanged from the prior  stage.  ------------------------------------------------------------------- Post procedure conclusions Ascending Aorta:  - The aorta was well visualized, not dilated, non-tortuous, trivially calcified, and trivially diseased.  ------------------------------------------------------------------- Measurements  Left ventricle                          Value    Reference LV ID, ED, PLAX chordal        (L)      38.7  mm 43 - 52 LV ID, ES, PLAX chordal                 26.1  mm 23 - 38 LV fx shortening, PLAX chordal          33    %  >=29  Legend: (L)  and  (H)  mark values outside specified reference range.  ------------------------------------------------------------------- Prepared and Electronically Authenticated by  Franky Bald, MD 2017-09-08T19:15:34  MONITORS  LONG TERM MONITOR (3-14 DAYS) 09/28/2022  Narrative   Predominant rhythm was normal sinus rhythm with average heart rate 75bpm and ranged from 52 to 169bpm.   Multiple episodes of nonsustained ventricular tachycardia lasting as long as 5 beats and fastest episode 169bpm.   Rare PACs, atrial couplets   Frequent PVCs, ventricular couplets and triplets.  Rare bigeminal and trigeminal PVCs.  PVC load 20.3%.   Patch Wear Time:  3 days and 12 hours (2024-04-10T14:12:48-0400 to 2024-04-14T02:31:52-0400)  Patient had a min HR of 52 bpm, max HR of 169 bpm, and avg HR of 75 bpm. Predominant underlying rhythm was Sinus Rhythm. 23 Ventricular Tachycardia runs occurred, the run with the fastest interval lasting 4 beats with a max rate of 169 bpm, the longest lasting 5 beats with an avg rate of 110 bpm. Isolated SVEs were rare (<1.0%), SVE Couplets were rare (<1.0%), and no SVE Triplets were present. Isolated VEs were frequent (20.3%, 70159), VE Couplets were frequent (5.6%, 9701), and VE Triplets were rare (<1.0%, 518). Ventricular Bigeminy and Trigeminy were present.        ______________________________________________________________________________________________       Risk Assessment/Calculations   HYPERTENSION CONTROL Vitals:   01/29/24 0911 01/29/24 1331  BP: (!) 178/80 (!) 170/74    The patient's blood pressure is elevated above target today.  In order to address the patient's elevated BP: A current anti-hypertensive medication was adjusted today.; Follow up with primary care provider for management.          Physical Exam VS:  BP (!) 170/74   Pulse 68   Ht 5' 1 (1.549 m)   Wt 112 lb 5 oz (50.9 kg)   SpO2 96%   BMI 21.22 kg/m        Wt Readings from Last 3 Encounters:  01/29/24 112 lb 5 oz (50.9 kg)  10/24/23 111 lb 3.2 oz (50.4 kg)  04/27/23 111 lb (50.3 kg)    GEN: Well nourished, well developed in no acute distress NECK: No JVD; 1/2 left carotid bruit CARDIAC: Normal RRR, no murmurs, rubs, gallops; 2+ radial and PT pulses RESPIRATORY:  Clear to auscultation without rales, wheezing or rhonchi  ABDOMEN: Soft, non-tender, non-distended EXTREMITIES:  No edema; No deformity   ASSESSMENT AND PLAN  CAD - exam stable; no symptoms of angina. No indication for ischemia evaluation. Continue ASA 81mg  and crestor  40mg  Recommend aiming for 150 minutes of moderate intensity activity per week and following a heart healthy diet.    HLD, LDL goal <70 - LDL at goal by 03/2023 labs Continue crestor  40mg  daily Lipids to be reassessed by PCP 04/2024  HTN - uncontrolled, per office and home readings Increase lisinopril  to 10mg  daily BMET in 2 weeks Monitor home BPs; discussed best practice, education provided; follow up on BP in 2 weeks via phone Monitor for worsening hoarseness; consider change to ARB  PVC - stable, asymptomatic; will continue to monitor  Carotid artery stenosis - asymptomatic 1/3 left bruit. Known mild bilateral carotid stenosis by imaging 2017 and 2022.  Update carotid duplex       Dispo: 3-4 months with Dr.  Lonni or APP  Signed, Reche GORMAN Finder, NP

## 2024-01-29 ENCOUNTER — Ambulatory Visit (HOSPITAL_BASED_OUTPATIENT_CLINIC_OR_DEPARTMENT_OTHER): Admitting: Family

## 2024-01-29 ENCOUNTER — Encounter (HOSPITAL_BASED_OUTPATIENT_CLINIC_OR_DEPARTMENT_OTHER): Payer: Self-pay | Admitting: Family

## 2024-01-29 VITALS — BP 170/74 | HR 68 | Ht 61.0 in | Wt 112.3 lb

## 2024-01-29 DIAGNOSIS — I6523 Occlusion and stenosis of bilateral carotid arteries: Secondary | ICD-10-CM | POA: Diagnosis not present

## 2024-01-29 DIAGNOSIS — E785 Hyperlipidemia, unspecified: Secondary | ICD-10-CM | POA: Diagnosis not present

## 2024-01-29 DIAGNOSIS — I10 Essential (primary) hypertension: Secondary | ICD-10-CM

## 2024-01-29 DIAGNOSIS — I2510 Atherosclerotic heart disease of native coronary artery without angina pectoris: Secondary | ICD-10-CM | POA: Diagnosis not present

## 2024-01-29 MED ORDER — LISINOPRIL 5 MG PO TABS: 10.0000 mg | ORAL_TABLET | Freq: Every day | ORAL | Status: DC

## 2024-01-29 NOTE — Patient Instructions (Signed)
 Medication Instructions:   INCREASE Lisinopril  to 10mg  daily Take two of your 5mg  tablets together  *If you need a refill on your cardiac medications before your next appointment, please call your pharmacy*  Lab Work: Your physician recommends that you return for lab work in 2 weeks: BMET  If you have labs (blood work) drawn today and your tests are completely normal, you will receive your results only by: MyChart Message (if you have MyChart) OR A paper copy in the mail If you have any lab test that is abnormal or we need to change your treatment, we will call you to review the results.  Testing/Procedures: Your physician has requested that you have a carotid duplex. This test is an ultrasound of the carotid arteries in your neck. It looks at blood flow through these arteries that supply the brain with blood. Allow one hour for this exam. There are no restrictions or special instructions.  Follow-Up: At [facility], you and your health needs are our priority.  As part of our continuing mission to provide you with exceptional heart care, our providers are all part of one team.  This team includes your primary Cardiologist (physician) and Advanced Practice Providers or APPs (Physician Assistants and Nurse Practitioners) who all work together to provide you with the care you need, when you need it.  Your next appointment:   January 2026 with Dr. Lonni or Reche GORMAN Finder, NP   We recommend signing up for the patient portal called MyChart.  Sign up information is provided on this After Visit Summary.  MyChart is used to connect with patients for Virtual Visits (Telemedicine).  Patients are able to view lab/test results, encounter notes, upcoming appointments, etc.  Non-urgent messages can be sent to your provider as well.   To learn more about what you can do with MyChart, go to [email or web address].   Other Instructions       Tips to Measure your Blood Pressure  Correctly  Here's what you can do to ensure a correct reading:  Don't drink a caffeinated beverage or smoke during the 30 minutes before the test.  Sit quietly for five minutes before the test begins.  During the measurement, sit in a chair with your feet on the floor and your arm supported so your elbow is at about heart level.  The inflatable part of the cuff should completely cover at least 80% of your upper arm, and the cuff should be placed on bare skin, not over a shirt.  Don't talk during the measurement.  Have your blood pressure measured twice, with a brief break in between. If the readings are different by 5 points or more, have it done a third time.  In 2017, new guidelines from the American Heart Association, the [employer] of Cardiology, and nine other health organizations lowered the diagnosis of high blood pressure to 130/80 mm Hg or higher for all adults. The guidelines also redefined the various blood pressure categories to now include normal, elevated, Stage 1 hypertension, Stage 2 hypertension, and hypertensive crisis (see Blood pressure categories).  Blood pressure categories  Blood pressure category SYSTOLIC (upper number)  DIASTOLIC (lower number)  Normal Less than 120 mm Hg and Less than 80 mm Hg  Elevated 120-129 mm Hg and Less than 80 mm Hg  High blood pressure: Stage 1 hypertension 130-139 mm Hg or 80-89 mm Hg  High blood pressure: Stage 2 hypertension 140 mm Hg or higher or 90 mm Hg or higher  Hypertensive crisis (consult your doctor immediately) Higher than 180 mm Hg and/or Higher than 120 mm Hg  Source: American Heart Association and American Stroke Association. For more on getting your blood pressure under control, buy Controlling Your Blood Pressure, a Special Health Report from [facility].   Blood Pressure Log   Date   Time  Blood Pressure  Position  Example: Nov 1 9 AM 124/78 sitting

## 2024-02-12 DIAGNOSIS — I10 Essential (primary) hypertension: Secondary | ICD-10-CM | POA: Diagnosis not present

## 2024-02-12 LAB — BASIC METABOLIC PANEL WITH GFR
BUN/Creatinine Ratio: 17 (ref 12–28)
BUN: 18 mg/dL (ref 8–27)
CO2: 22 mmol/L (ref 20–29)
Calcium: 9.6 mg/dL (ref 8.7–10.3)
Chloride: 100 mmol/L (ref 96–106)
Creatinine, Ser: 1.06 mg/dL — ABNORMAL HIGH (ref 0.57–1.00)
Glucose: 113 mg/dL — ABNORMAL HIGH (ref 70–99)
Potassium: 4 mmol/L (ref 3.5–5.2)
Sodium: 140 mmol/L (ref 134–144)
eGFR: 52 mL/min/1.73 — ABNORMAL LOW (ref >59)

## 2024-02-13 ENCOUNTER — Ambulatory Visit (HOSPITAL_BASED_OUTPATIENT_CLINIC_OR_DEPARTMENT_OTHER): Payer: Self-pay | Admitting: Family

## 2024-02-13 DIAGNOSIS — I6523 Occlusion and stenosis of bilateral carotid arteries: Secondary | ICD-10-CM

## 2024-02-13 DIAGNOSIS — I10 Essential (primary) hypertension: Secondary | ICD-10-CM

## 2024-02-29 ENCOUNTER — Encounter (HOSPITAL_BASED_OUTPATIENT_CLINIC_OR_DEPARTMENT_OTHER): Payer: Self-pay

## 2024-03-03 ENCOUNTER — Ambulatory Visit (INDEPENDENT_AMBULATORY_CARE_PROVIDER_SITE_OTHER)

## 2024-03-03 DIAGNOSIS — I6523 Occlusion and stenosis of bilateral carotid arteries: Secondary | ICD-10-CM

## 2024-03-04 NOTE — Telephone Encounter (Signed)
-----   Message from Reche GORMAN Finder sent at 03/04/2024  2:42 PM EDT ----- Mild bilateral carotid stenosis. Continue rosuvastatin  to prevent progression. This is stable from previous, repeat carotid duplex in 2 years for monitoring.  ----- Message ----- From: Interface, Three One Seven Sent: 03/03/2024   9:42 AM EDT To: Reche GORMAN Finder, NP

## 2024-03-04 NOTE — Telephone Encounter (Signed)
 Pt made aware of carotid US  results and plan per Reche Finder, NP.   Will go ahead and place the 2 year repeat carotid US  in the system and scheduling will reach out to her closer to that time frame to arrange that appt.   Pt did view results in her mychart account.  Last read by Inocente JENEANE Northern at 3:28PM on 03/04/2024.

## 2024-03-26 LAB — BASIC METABOLIC PANEL WITH GFR
BUN/Creatinine Ratio: 13 (ref 12–28)
BUN: 14 mg/dL (ref 8–27)
CO2: 20 mmol/L (ref 20–29)
Calcium: 9.4 mg/dL (ref 8.7–10.3)
Chloride: 104 mmol/L (ref 96–106)
Creatinine, Ser: 1.07 mg/dL — ABNORMAL HIGH (ref 0.57–1.00)
Glucose: 159 mg/dL — ABNORMAL HIGH (ref 70–99)
Potassium: 4.1 mmol/L (ref 3.5–5.2)
Sodium: 138 mmol/L (ref 134–144)
eGFR: 52 mL/min/1.73 — ABNORMAL LOW (ref >59)

## 2024-04-11 DIAGNOSIS — Z79899 Other long term (current) drug therapy: Secondary | ICD-10-CM | POA: Diagnosis not present

## 2024-04-11 DIAGNOSIS — E785 Hyperlipidemia, unspecified: Secondary | ICD-10-CM | POA: Diagnosis not present

## 2024-04-11 DIAGNOSIS — M85851 Other specified disorders of bone density and structure, right thigh: Secondary | ICD-10-CM | POA: Diagnosis not present

## 2024-04-11 DIAGNOSIS — E538 Deficiency of other specified B group vitamins: Secondary | ICD-10-CM | POA: Diagnosis not present

## 2024-04-11 DIAGNOSIS — R7303 Prediabetes: Secondary | ICD-10-CM | POA: Diagnosis not present

## 2024-04-16 DIAGNOSIS — I6523 Occlusion and stenosis of bilateral carotid arteries: Secondary | ICD-10-CM | POA: Diagnosis not present

## 2024-04-16 DIAGNOSIS — Z79899 Other long term (current) drug therapy: Secondary | ICD-10-CM | POA: Diagnosis not present

## 2024-04-16 DIAGNOSIS — E1159 Type 2 diabetes mellitus with other circulatory complications: Secondary | ICD-10-CM | POA: Diagnosis not present

## 2024-04-16 DIAGNOSIS — Z6821 Body mass index (BMI) 21.0-21.9, adult: Secondary | ICD-10-CM | POA: Diagnosis not present

## 2024-04-16 DIAGNOSIS — I10 Essential (primary) hypertension: Secondary | ICD-10-CM | POA: Diagnosis not present

## 2024-04-16 DIAGNOSIS — Z1331 Encounter for screening for depression: Secondary | ICD-10-CM | POA: Diagnosis not present

## 2024-04-16 DIAGNOSIS — Z01419 Encounter for gynecological examination (general) (routine) without abnormal findings: Secondary | ICD-10-CM | POA: Diagnosis not present

## 2024-04-16 DIAGNOSIS — E782 Mixed hyperlipidemia: Secondary | ICD-10-CM | POA: Diagnosis not present

## 2024-04-16 DIAGNOSIS — Z0000 Encounter for general adult medical examination without abnormal findings: Secondary | ICD-10-CM | POA: Diagnosis not present

## 2024-04-16 DIAGNOSIS — N951 Menopausal and female climacteric states: Secondary | ICD-10-CM | POA: Diagnosis not present

## 2024-04-16 DIAGNOSIS — I2510 Atherosclerotic heart disease of native coronary artery without angina pectoris: Secondary | ICD-10-CM | POA: Diagnosis not present

## 2024-04-16 DIAGNOSIS — M85851 Other specified disorders of bone density and structure, right thigh: Secondary | ICD-10-CM | POA: Diagnosis not present

## 2024-04-17 ENCOUNTER — Other Ambulatory Visit (HOSPITAL_BASED_OUTPATIENT_CLINIC_OR_DEPARTMENT_OTHER): Payer: Self-pay | Admitting: Family Medicine

## 2024-04-17 DIAGNOSIS — M8588 Other specified disorders of bone density and structure, other site: Secondary | ICD-10-CM

## 2024-04-29 DIAGNOSIS — I10 Essential (primary) hypertension: Secondary | ICD-10-CM | POA: Diagnosis not present

## 2024-06-06 ENCOUNTER — Encounter (HOSPITAL_BASED_OUTPATIENT_CLINIC_OR_DEPARTMENT_OTHER): Payer: Self-pay | Admitting: Family

## 2024-06-06 ENCOUNTER — Ambulatory Visit (HOSPITAL_BASED_OUTPATIENT_CLINIC_OR_DEPARTMENT_OTHER): Admitting: Family

## 2024-06-06 VITALS — BP 132/68 | HR 77 | Ht 61.0 in | Wt 111.2 lb

## 2024-06-06 DIAGNOSIS — I6523 Occlusion and stenosis of bilateral carotid arteries: Secondary | ICD-10-CM

## 2024-06-06 DIAGNOSIS — I493 Ventricular premature depolarization: Secondary | ICD-10-CM | POA: Diagnosis not present

## 2024-06-06 DIAGNOSIS — I25118 Atherosclerotic heart disease of native coronary artery with other forms of angina pectoris: Secondary | ICD-10-CM | POA: Diagnosis not present

## 2024-06-06 DIAGNOSIS — E785 Hyperlipidemia, unspecified: Secondary | ICD-10-CM

## 2024-06-06 NOTE — Patient Instructions (Addendum)
 Medication Instructions:  Continue your current medications.   *If you need a refill on your cardiac medications before your next appointment, please call your pharmacy*  Follow-Up: At [facility], you and your health needs are our priority.  As part of our continuing mission to provide you with exceptional heart care, our providers are all part of one team.  This team includes your primary Cardiologist (physician) and Advanced Practice Providers or APPs (Physician Assistants and Nurse Practitioners) who all work together to provide you with the care you need, when you need it.  Your next appointment:   6 month(s)  Provider:   Rosaline Bane, NP or Reche Finder, NP    We recommend signing up for the patient portal called MyChart.  Sign up information is provided on this After Visit Summary.  MyChart is used to connect with patients for Virtual Visits (Telemedicine).  Patients are able to view lab/test results, encounter notes, upcoming appointments, etc.  Non-urgent messages can be sent to your provider as well.   To learn more about what you can do with MyChart, go to [email or web address].   Other Instructions  Heart Healthy Diet Recommendations: A low-salt diet is recommended. Meats should be grilled, baked, or boiled. Avoid fried foods. Focus on lean protein sources like fish or chicken with vegetables and fruits. The American Heart Association is a Chief Technology Officer!  American Heart Association Diet and Lifeystyle Recommendations   Exercise recommendations: The American Heart Association recommends 150 minutes of moderate intensity exercise weekly. Try 30 minutes of moderate intensity exercise 4-5 times per week. This could include walking, jogging, or swimming.

## 2024-06-06 NOTE — Progress Notes (Signed)
 " Cardiology Office Note   Date:  06/06/2024  ID:  Carolyn Burns, DOB 1940-10-19, MRN [id number] PCP: Aisha Harvey, MD  [facility] Providers Cardiologist:  None Cardiology APP:  Vannie Reche RAMAN, NP  Electrophysiologist:  Soyla Gladis Norton, MD     History of Present Illness Carolyn Burns is a 84 y.o. female with hx of CAD s/p CABG x3 2017, HTN, HLD, PVC, carotid stenosis  Her CAD dates back to NSTEMI 01/2016 followed by CABGx3 (LIMA-LAD, SVG-OM1, SVG-OM2). She had ischemic cardiomyopathy with previous EF 35-40% in 01/2016. LVEF improved post surgical revascularization with repeat echo LVEF 50-55%, mid anterior septal hypokinesis, grade 1 diastolic dysfunction, mild MR. Previously required down titration of Metoprolol  due to bradycardia.  Seen by EP 10/24/23 with no palpitations, no PVC by EKG. She was recommended to follow up as needed with EP.  At visit 01/29/24 due to elevated BP Lisinopril  increased to 10mg  daily. Updated carotid duplex 02/2024 mild bilateral 1-39% stenosis. Repeat carotid duplex in 2 years recommended.   Presents today for follow-up independently. Since last seen PCP changed from Lisinopril  to Losartan 50mg  daily due to hoarseness.  Reports this has resolved her hoarseness, cough. Reports her blood pressure at home has been normal in the 120s. Reports no shortness of breath nor dyspnea on exertion. Reports no chest pain, pressure, or tightness. No edema, orthopnea, PND. Reports no palpitations.    Notes more difficult with her arthritis for which primary care has given her Celebrex. Most bothersome in hands if it is cold and now in her hips bilaterally. Once she gets up moving, it helps. She does use Voltaren gel regularly first thing in the morning. She sees a land every 2 months on a maintenance program, has not done PT. She also does som stretching in the morning. She takes 2 tylenol  arthritis in the morning which does help some.   ROS: Please  see the history of present illness.    All other systems reviewed and are negative.   Studies Reviewed      Cardiac Studies & Procedures   ______________________________________________________________________________________________ CARDIAC CATHETERIZATION  CARDIAC CATHETERIZATION 02/01/2016  Conclusion  LM lesion, 75 %stenosed.  Ost LAD lesion, 70 %stenosed.  Prox LAD to Mid LAD lesion, 80 %stenosed.  LPDA lesion, 70 %stenosed.  Ost Cx to Prox Cx lesion, 50 %stenosed.  Prox Cx lesion, 70 %stenosed.  1st Mrg lesion, 90 %stenosed.  There is severe left ventricular systolic dysfunction.  LV end diastolic pressure is moderately elevated.  Severe two-vessel coronary artery disease with separate ostium to the LAD and circumflex vessel with ostial 70% diffuse LAD stenosis followed by 70%and 80% proximal to mid stenoses; 50 and 70% proximal circumflex stenosis with 90% stenosis in a large OM1 vessel and 70% distal stenosis in the PLA branch of the dominant circumflex vessel, and small nondominant RCA.  Severe LV dysfunction with significant hypo-to akinesis involving the mid distal anterolateral wall, apex and mid distal inferior wall.  Ejection fraction is 25%.  RECOMMENDATION: Surgical consultation for consideration of CABG revascularization surgery.  Findings Coronary Findings Diagnostic  Dominance: Left  Left Main There appear to be a separate ostium to the LAD and left circumflex vessel.  Left Anterior Descending The LAD was diffusely diseased.  There was 70% ostial stenosis followed by 70% proximal and 80% proximal to mid stenoses in the region of the first diagonal takeoff.  Left Circumflex The circumflex seemed to have a separate ostium from the LAD.  There was 50% followed by 70% proximal stenoses.  The circumflex marginal 1 vessel was large caliber and had 90% focal stenosis.  The distal PLA branch of the dominant circumflex had 70% stenosis.  First Obtuse  Marginal Branch  Left Posterior Descending Artery  Right Coronary Artery The RCA was a very small nondominant vessel that gave rise to several small proximal branches.  Intervention  No interventions have been documented.     ECHOCARDIOGRAM  ECHOCARDIOGRAM COMPLETE 03/26/2023  Narrative ECHOCARDIOGRAM REPORT    Patient Name:   Carolyn Burns Date of Exam: 03/26/2023 Medical Rec #:  [id number]           Height:       61.0 in Accession #:    [id number]          Weight:       105.0 lb Date of Birth:  11-21-1940           BSA:          1.436 m Patient Age:    82 years            BP:           128/72 mmHg Patient Gender: F                   HR:           65 bpm. Exam Location:  [address]  Procedure: 2D Echo, Cardiac Doppler and Color Doppler  Indications:    I49.3 PVC  History:        Patient has prior history of Echocardiogram examinations, most recent 10/24/2022. CHF, CAD and Previous Myocardial Infarction, Prior CABG; Risk Factors:Hypertension and Dyslipidemia. Ischemic cardiomyopathy. Bradycardia.  Sonographer:    Jon Hacker RCS Referring Phys: [id number] WILL MARTIN CAMNITZ  IMPRESSIONS   1. Left ventricular ejection fraction, by estimation, is 50 to 55%. The left ventricle has low normal function. The left ventricle has no regional wall motion abnormalities. Left ventricular diastolic parameters are consistent with Grade I diastolic dysfunction (impaired relaxation). 2. Right ventricular systolic function is normal. The right ventricular size is normal. There is mildly elevated pulmonary artery systolic pressure. The estimated right ventricular systolic pressure is 40.1 mmHg. 3. The mitral valve is normal in structure. Mild to moderate mitral valve regurgitation. No evidence of mitral stenosis. 4. Tricuspid valve regurgitation is moderate. 5. The aortic valve is normal in structure. There is mild calcification of the aortic valve. Aortic valve regurgitation  is moderate. No aortic stenosis is present.  FINDINGS Left Ventricle: Left ventricular ejection fraction, by estimation, is 50 to 55%. The left ventricle has low normal function. The left ventricle has no regional wall motion abnormalities. The left ventricular internal cavity size was normal in size. There is borderline left ventricular hypertrophy. Left ventricular diastolic parameters are consistent with Grade I diastolic dysfunction (impaired relaxation).  Right Ventricle: The right ventricular size is normal. No increase in right ventricular wall thickness. Right ventricular systolic function is normal. There is mildly elevated pulmonary artery systolic pressure. The tricuspid regurgitant velocity is 2.92 m/s, and with an assumed right atrial pressure of 6 mmHg, the estimated right ventricular systolic pressure is 40.1 mmHg.  Left Atrium: Left atrial size was normal in size.  Right Atrium: Right atrial size was normal in size.  Pericardium: There is no evidence of pericardial effusion.  Mitral Valve: The mitral valve is normal in structure. Mild to moderate mitral valve regurgitation. No evidence of mitral valve  stenosis.  Tricuspid Valve: The tricuspid valve is normal in structure. Tricuspid valve regurgitation is moderate . No evidence of tricuspid stenosis.  Aortic Valve: The aortic valve is normal in structure. There is mild calcification of the aortic valve. Aortic valve regurgitation is moderate. Aortic regurgitation PHT measures 444 msec. No aortic stenosis is present.  Pulmonic Valve: The pulmonic valve was normal in structure. Pulmonic valve regurgitation is not visualized. No evidence of pulmonic stenosis.  Aorta: The aortic root is normal in size and structure.  Venous: The inferior vena cava was not well visualized.  IAS/Shunts: No atrial level shunt detected by color flow Doppler.   LEFT VENTRICLE PLAX 2D LVIDd:         4.20 cm   Diastology LVIDs:         3.20 cm    LV e' medial:    8.05 cm/s LV PW:         1.10 cm   LV E/e' medial:  13.2 LV IVS:        1.00 cm   LV e' lateral:   11.30 cm/s LVOT diam:     1.90 cm   LV E/e' lateral: 9.4 LV SV:         52 LV SV Index:   36 LVOT Area:     2.84 cm   RIGHT VENTRICLE RV Basal diam:  2.40 cm RV S prime:     8.05 cm/s TAPSE (M-mode): 1.4 cm RVSP:           37.1 mmHg  LEFT ATRIUM             Index        RIGHT ATRIUM           Index LA diam:        3.20 cm 2.23 cm/m   RA Pressure: 3.00 mmHg LA Vol (A2C):   23.2 ml 16.15 ml/m  RA Area:     8.81 cm LA Vol (A4C):   34.3 ml 23.88 ml/m  RA Volume:   14.60 ml  10.17 ml/m LA Biplane Vol: 29.0 ml 20.19 ml/m AORTIC VALVE LVOT Vmax:   70.60 cm/s LVOT Vmean:  44.500 cm/s LVOT VTI:    0.183 m AI PHT:      444 msec  AORTA Ao Root diam: 2.60 cm Ao Asc diam:  3.40 cm  MITRAL VALVE                TRICUSPID VALVE MV Area (PHT): 4.31 cm     TR Peak grad:   34.1 mmHg MV Decel Time: 176 msec     TR Vmax:        292.00 cm/s MV E velocity: 106.00 cm/s  Estimated RAP:  3.00 mmHg MV A velocity: 108.00 cm/s  RVSP:           37.1 mmHg MV E/A ratio:  0.98 SHUNTS Systemic VTI:  0.18 m Systemic Diam: 1.90 cm  Aditya Sabharwal Electronically signed by Ria Commander Signature Date/Time: 03/26/2023/10:45:34 AM    Final   TEE  ECHO TEE 02/10/2016  Narrative *[place]* *[facility]* [address] [place], [place] [address] [phone]  ------------------------------------------------------------------- Intraoperative Transesophageal Echocardiography  Patient:    Carolyn Burns, Carolyn Burns MR #:       [id number] Study Date: 02/10/2016 Gender:     F Age:        75 Height:     154.9 cm Weight:     51.1 kg  BSA:        1.49 m^2 Pt. Status: Room:       2S03C  ATTENDING    Obadiah Maude TISA Obadiah Maude BARTON Obadiah Maude SONOGRAPHER  Jon Hacker PERFORMING   Franky Bald, MD ADMITTING    [place] [facility]  S  cc:  ------------------------------------------------------------------- LV EF: 50% -  ------------------------------------------------------------------- Study Conclusions  - Left ventricle: The cavity size was normal. Wall thickness was normal. The estimated ejection fraction was = 50%. There was no dynamic obstruction. Mild diffuse hypokinesis with distinct regional wall motion abnormalities. Hypokinesis of the inferoseptal and apical myocardium; consistent with ischemia. - Aortic valve: There was trivial regurgitation directed centrally in the LVOT. There was no significant perivalvular regurgitation. - Atrial septum: No defect or patent foramen ovale was identified.  Intraoperative transesophageal echocardiography.  Birthdate: Patient birthdate: [birth year].  Age:  Patient is 84 yr old.  Sex: Gender: female.    BMI: 21.3 kg/m^2.  Study date:  Study date: 02/10/2016. Study time: 07:53 AM.  -------------------------------------------------------------------  ------------------------------------------------------------------- Left ventricle:  Well visualized. The cavity size was normal. Wall thickness was normal. There was no hypertrophy. The estimated ejection fraction was = 50%. There was no dynamic obstruction. Mild diffuse hypokinesis with distinct regional wall motion abnormalities.  Regional wall motion abnormalities:   Hypokinesis of the inferoseptal and apical myocardium; consistent with ischemia.  ------------------------------------------------------------------- Aortic valve:  Well visualized.  Normal-sized annulus. Structurally normal valve. The valve appears to be grossly normal. Trileaflet; normal thickness leaflets. Cusp separation was normal.  Doppler: Transvalvular velocity was within the normal range. There was no stenosis. There was trivial regurgitation directed centrally in the LVOT. There was no significant perivalvular  regurgitation.  ------------------------------------------------------------------- Aorta:   The arch was left-sided.   The aorta was well visualized, not dilated, non-tortuous, trivially calcified, and trivially diseased.  ------------------------------------------------------------------- Mitral valve:  Well visualized.  Normal-sized annulus. Structurally normal valve. The valve appears to be grossly normal. Leaflet separation was normal. There was no systolic anterior motion. Doppler:  Transvalvular velocity was within the normal range. There was no evidence for stenosis. There was trivial regurgitation.  ------------------------------------------------------------------- Left atrium:  Well visualized. The atrium was normal in size.  ------------------------------------------------------------------- Atrial septum:  Well visualized. The septum was normal. No defect or patent foramen ovale was identified.  ------------------------------------------------------------------- Right ventricle:  Well visualized. The cavity size was normal. Wall thickness was normal. There was no evidence of concentric hypertrophy. Systolic function was normal. There were no regional wall motion abnormalities.  ------------------------------------------------------------------- Pulmonic valve:   Well visualized.  Normal-sized annulus. Structurally normal valve. The valve appears to be grossly normal. Cusp separation was normal.  Doppler:  Transvalvular velocity was within the normal range. There was no significant regurgitation.  ------------------------------------------------------------------- Tricuspid valve:  Well visualized.  Normal-sized annulus. Structurally normal valve. The valve appears to be grossly normal. Leaflet separation was normal.  Doppler:  There was trivial regurgitation directed centrally.  ------------------------------------------------------------------- Right atrium:   Well visualized. The atrium was normal in size.  ------------------------------------------------------------------- Pericardium:  The pericardium was normal in appearance. There was no pericardial effusion.  ------------------------------------------------------------------- Post bypass:  - LV size was normal. - The estimated LV ejection fraction was 50-55%. - Hypokinesis of the apical septal LV myocardium, improved from the prior stage and improved from baseline.  Aortic valve: stenosis; unchanged from the prior stage.  ------------------------------------------------------------------- Post procedure conclusions Ascending Aorta:  - The aorta was well visualized, not dilated, non-tortuous,  trivially calcified, and trivially diseased.  ------------------------------------------------------------------- Measurements  Left ventricle                          Value    Reference LV ID, ED, PLAX chordal        (L)      38.7  mm 43 - 52 LV ID, ES, PLAX chordal                 26.1  mm 23 - 38 LV fx shortening, PLAX chordal          33    %  >=29  Legend: (L)  and  (H)  mark values outside specified reference range.  ------------------------------------------------------------------- Prepared and Electronically Authenticated by  Franky Bald, MD 2017-09-08T19:15:34  MONITORS  LONG TERM MONITOR (3-14 DAYS) 09/28/2022  Narrative   Predominant rhythm was normal sinus rhythm with average heart rate 75bpm and ranged from 52 to 169bpm.   Multiple episodes of nonsustained ventricular tachycardia lasting as long as 5 beats and fastest episode 169bpm.   Rare PACs, atrial couplets   Frequent PVCs, ventricular couplets and triplets.  Rare bigeminal and trigeminal PVCs.  PVC load 20.3%.   Patch Wear Time:  3 days and 12 hours (2024-04-10T14:12:48-0400 to 2024-04-14T02:31:52-0400)  Patient had a min HR of 52 bpm, max HR of 169 bpm, and avg HR of 75 bpm. Predominant underlying rhythm  was Sinus Rhythm. 23 Ventricular Tachycardia runs occurred, the run with the fastest interval lasting 4 beats with a max rate of 169 bpm, the longest lasting 5 beats with an avg rate of 110 bpm. Isolated SVEs were rare (<1.0%), SVE Couplets were rare (<1.0%), and no SVE Triplets were present. Isolated VEs were frequent (20.3%, 70159), VE Couplets were frequent (5.6%, 9701), and VE Triplets were rare (<1.0%, 518). Ventricular Bigeminy and Trigeminy were present.       ______________________________________________________________________________________________         Risk Assessment/Calculations           Physical Exam VS:  BP 132/68   Pulse 77   Ht 5' 1 (1.549 m)   Wt 111 lb 3.2 oz (50.4 kg)   SpO2 98%   BMI 21.01 kg/m        Wt Readings from Last 3 Encounters:  06/06/24 111 lb 3.2 oz (50.4 kg)  01/29/24 112 lb 5 oz (50.9 kg)  10/24/23 111 lb 3.2 oz (50.4 kg)    GEN: Well nourished, well developed in no acute distress NECK: No JVD; 1/2 left carotid bruit CARDIAC: Normal RRR, no murmurs, rubs, gallops; 2+ radial and PT pulses RESPIRATORY:  Clear to auscultation without rales, wheezing or rhonchi  ABDOMEN: Soft, non-tender, non-distended EXTREMITIES:  No edema; No deformity   ASSESSMENT AND PLAN  CAD / HLD, LDL goal <70 - Stable with no anginal symptoms. No indication for ischemic evaluation.  Continue ASA 81mg  and crestor  40mg . 04/11/24 LDL 59. Recommend aiming for 150 minutes of moderate intensity activity per week and following a heart healthy diet.    HTN - Controlled by home readings. Continue Losartan 50mg  daily. Discussed to monitor BP at home at least 2 hours after medications and sitting for 5-10 minutes.   Arthritis - on Celebrex per primary care.  Using OTC Voltaren gel as well as PRN Tylenol . Encouraged to continue stretching regimen. Discussed possible referral to PT, she will contact us  if interested.   PVC - Monitor 09/2022 PVC 20%.  Previous bradycardia with  BB. Normal LVEF by echo 03/2023. Per EP 10/2023 she had preferred watch and wait strategy rather than AAD, follow up with EP PRN. If reduced LVEF in the future, would require AAD.Reports no palpitations.  Carotid artery stenosis - asymptomatic 1/3 left bruit. Known mild bilateral carotid stenosis by imaging 2017 and 2022 and 2025. Consider repeat imaging 2027. Continue Rosuvastatin  40mg  daily.        Dispo: follow up in 6 months  Signed, Reche GORMAN Finder, NP   "

## 2025-02-17 ENCOUNTER — Other Ambulatory Visit (HOSPITAL_BASED_OUTPATIENT_CLINIC_OR_DEPARTMENT_OTHER)
